# Patient Record
Sex: Male | Born: 1940
Health system: Southern US, Community
[De-identification: ages and names within clinical notes are randomized; demographics above are authoritative.]

## PROBLEM LIST (undated history)

## (undated) DIAGNOSIS — F32A Depression, unspecified: Secondary | ICD-10-CM

## (undated) DIAGNOSIS — I10 Essential (primary) hypertension: Secondary | ICD-10-CM

## (undated) DIAGNOSIS — E119 Type 2 diabetes mellitus without complications: Secondary | ICD-10-CM

## (undated) DIAGNOSIS — K420 Umbilical hernia with obstruction, without gangrene: Secondary | ICD-10-CM

## (undated) DIAGNOSIS — Z86010 Personal history of colonic polyps: Secondary | ICD-10-CM

## (undated) DIAGNOSIS — K219 Gastro-esophageal reflux disease without esophagitis: Secondary | ICD-10-CM

## (undated) DIAGNOSIS — Z860101 Personal history of adenomatous and serrated colon polyps: Secondary | ICD-10-CM

## (undated) DIAGNOSIS — C61 Malignant neoplasm of prostate: Secondary | ICD-10-CM

## (undated) DIAGNOSIS — E785 Hyperlipidemia, unspecified: Secondary | ICD-10-CM

## (undated) HISTORY — DX: Essential (primary) hypertension: I10

## (undated) HISTORY — DX: Umbilical hernia with obstruction, without gangrene: K42.0

## (undated) HISTORY — DX: Malignant neoplasm of prostate: C61

## (undated) HISTORY — DX: Personal history of adenomatous and serrated colon polyps: Z86.0101

## (undated) HISTORY — DX: Personal history of colonic polyps: Z86.010

## (undated) HISTORY — DX: Gastro-esophageal reflux disease without esophagitis: K21.9

---

## 2001-08-26 HISTORY — PX: KNEE ARTHROSCOPY: SUR90

## 2002-05-04 ENCOUNTER — Ambulatory Visit (HOSPITAL_BASED_OUTPATIENT_CLINIC_OR_DEPARTMENT_OTHER): Admission: RE | Admit: 2002-05-04 | Discharge: 2002-05-04 | Payer: Self-pay | Admitting: Orthopaedic Surgery

## 2003-07-15 ENCOUNTER — Emergency Department (HOSPITAL_COMMUNITY): Admission: EM | Admit: 2003-07-15 | Discharge: 2003-07-16 | Payer: Self-pay | Admitting: Emergency Medicine

## 2006-03-28 ENCOUNTER — Ambulatory Visit (HOSPITAL_COMMUNITY): Admission: RE | Admit: 2006-03-28 | Discharge: 2006-03-28 | Payer: Self-pay | Admitting: Family Medicine

## 2007-06-23 ENCOUNTER — Ambulatory Visit (HOSPITAL_COMMUNITY): Admission: RE | Admit: 2007-06-23 | Discharge: 2007-06-23 | Payer: Self-pay | Admitting: Internal Medicine

## 2007-06-23 ENCOUNTER — Ambulatory Visit: Payer: Self-pay | Admitting: Internal Medicine

## 2007-06-23 ENCOUNTER — Encounter: Payer: Self-pay | Admitting: Internal Medicine

## 2007-06-23 HISTORY — PX: COLONOSCOPY: SHX174

## 2007-12-16 ENCOUNTER — Ambulatory Visit (HOSPITAL_COMMUNITY): Admission: RE | Admit: 2007-12-16 | Discharge: 2007-12-16 | Payer: Self-pay | Admitting: Internal Medicine

## 2008-01-13 ENCOUNTER — Ambulatory Visit (HOSPITAL_COMMUNITY): Admission: RE | Admit: 2008-01-13 | Discharge: 2008-01-13 | Payer: Self-pay | Admitting: Internal Medicine

## 2010-11-14 ENCOUNTER — Emergency Department (HOSPITAL_COMMUNITY)
Admission: EM | Admit: 2010-11-14 | Discharge: 2010-11-14 | Disposition: A | Discharge status: Home / Self Care | Payer: PRIVATE HEALTH INSURANCE | Attending: Emergency Medicine | Admitting: Emergency Medicine

## 2010-11-14 ENCOUNTER — Emergency Department (HOSPITAL_COMMUNITY): Payer: PRIVATE HEALTH INSURANCE

## 2010-11-14 DIAGNOSIS — M542 Cervicalgia: Secondary | ICD-10-CM | POA: Insufficient documentation

## 2010-11-14 DIAGNOSIS — I10 Essential (primary) hypertension: Secondary | ICD-10-CM | POA: Insufficient documentation

## 2010-11-14 DIAGNOSIS — R51 Headache: Secondary | ICD-10-CM | POA: Insufficient documentation

## 2010-11-17 ENCOUNTER — Emergency Department (HOSPITAL_COMMUNITY)
Admission: EM | Admit: 2010-11-17 | Discharge: 2010-11-17 | Disposition: A | Discharge status: Home / Self Care | Payer: PRIVATE HEALTH INSURANCE | Attending: Emergency Medicine | Admitting: Emergency Medicine

## 2010-11-17 DIAGNOSIS — I10 Essential (primary) hypertension: Secondary | ICD-10-CM | POA: Insufficient documentation

## 2010-11-17 DIAGNOSIS — K219 Gastro-esophageal reflux disease without esophagitis: Secondary | ICD-10-CM | POA: Insufficient documentation

## 2010-11-17 DIAGNOSIS — R51 Headache: Secondary | ICD-10-CM | POA: Insufficient documentation

## 2011-01-08 NOTE — Op Note (Signed)
Alejandro Krueger, Alejandro Krueger                ACCOUNT NO.:  [id number]   MEDICAL RECORD NO.:  [id number]          PATIENT TYPE:  AMB   LOCATION:  DAY                           FACILITY:  [facility]   PHYSICIAN:  R. Roetta Sessions, M.D. DATE OF BIRTH:  08/25/1941   DATE OF PROCEDURE:  06/23/2007  DATE OF DISCHARGE:                               OPERATIVE REPORT   PROCEDURE:  Colonoscopy and biopsy and snare polypectomy.   INDICATIONS FOR PROCEDURE:  A 70 year old gentleman with no lower GI  tract symptoms and no family history of colon cancer sent over at the  courtesy of Dr. Margo Aye for colorectal cancer screening.   He has never had his colon imaged previously.  Colonoscopy is now being  done as a standard screening maneuver. This approach has been discussed  with the patient at length. The potential risks, benefits and  alternatives have been reviewed, questions answered and he is agreeable.  Please see the documentation in the medical record.   MONITORING:  O2 saturation, blood pressure, pulse, and respirations were  monitored throughout the entire procedure.   CONSCIOUS SEDATION:  Versed 4 mg IV, Demerol 75 mg IV in divided doses.   INSTRUMENT:  Pentax video chip system.   Digital rectal exam revealed no abnormalities.   ENDOSCOPIC FINDINGS:  The prep was adequate.   COLON:  The colonic mucosa was surveyed from the rectosigmoid junction  through the left transverse and right colon to the area of the  appendiceal orifice, ileocecal valve and cecum.  These structures were  well seen and photographed for the record.  From this level, the scope  was slowly withdrawn. All previously mentioned mucosal surfaces were  again seen. The patient was noted to have left-sided diverticula.  The  patient had cecal and hepatic flexure polyps.  There were two 5 mm  polyps at the base of the cecum which were cold snared and cleanly  removed and recovered through the scope.  There was a third diminutive  polyp  at the base of the cecum which was cold biopsied/removed. At the  hepatic flexure, there was a single 4-mm polyp which was cold snared and  recovered through the scope. The remainder of the colonic mucosa  appeared normal. The scope was pulled down to the rectum where a  thorough examination of the rectal mucosa including a retroflexed view  of the anal verge demonstrated no abnormalities.  The patient tolerated  the procedure well and was reacted in endoscopy.   IMPRESSION:  1. Normal rectum.  2. Left-sided diverticula.  3. Multiple polyps removed (cecum and hepatic flexure). The remainder      of the colonic mucosa appeared normal.   RECOMMENDATIONS:  1. Diverticulosis literature provided to Mr. Ruane.  2. Follow-up on path.  3. Further recommendations to follow.      Jonathon Bellows, M.D.  Electronically Signed     RMR/MEDQ  D:  06/23/2007  T:  06/23/2007  Job:  [id number]   cc:   Catalina Pizza, M.D.  Fax: [phone]

## 2011-01-11 NOTE — Op Note (Signed)
Alejandro Krueger, Alejandro Krueger                           ACCOUNT NO.:  [id number]   MEDICAL RECORD NO.:  [id number]                   PATIENT TYPE:   LOCATION:                                       FACILITY:  [facility]   PHYSICIAN:  Lubertha Basque. Jerl Santos, M.D.             DATE OF BIRTH:   DATE OF PROCEDURE:  05/04/2002  DATE OF DISCHARGE:                                 OPERATIVE REPORT   PREOPERATIVE DIAGNOSIS:  Right knee torn medial meniscus.   POSTOPERATIVE DIAGNOSES:  1. Right knee torn medial meniscus.  2. Right knee chondromalacia of patellofemoral joint.   PROCEDURES:  1. Right knee partial medial meniscectomy.  2. Right knee chondroplasty of the patellofemoral joint.   ANESTHESIA:  Knee block/MAC.   ATTENDING SURGEON:  Lubertha Basque. Jerl Santos, M.D.   ASSISTANT:  Prince Rome, P.A.-C   INDICATIONS FOR PROCEDURE:  The patient is a 70 year old man, with a long  history of right knee pain since a work-related injury where he stepped into  a hole.  He has persisted with pain and swelling since that time.  History  and exam are consistent with a torn median meniscus, and he has been offered  an arthroscopy having failed nonoperative measures of activity restriction  and anti-inflammatories and bracing.  Informed operative consent was  obtained after a discussion of the possible complications of reaction to  anesthesia and infection.   DESCRIPTION OF PROCEDURE:  The patient was taken to the operating suite  where a knee block was applied along with MAC.  He was positioned supine and  prepped and draped in the normal sterile fashion.  After administration of  IV antibiotics, an arthroscopy of the right knee was performed through two  inferior portals.  The patient remained awake throughout the case.  Suprapatellar pouch was benign, while the patellofemoral joint exhibited  some grade 2 and grade 3 change across the apex of the patella and through  the intertrochlear groove.  A thorough  chondroplasty was done of loose  articular cartilage.  The kneecap tracked fairly well.   In the medial compartment, he did have a degenerative tear of the posterior  horn of the medial meniscus.  This required removal of about 15% of the  medial meniscus back to stable structures.  He had minimal degenerative  change in his compartment.  ACL was intact, and the lateral compartment was  completely benign.  The knee was thoroughly irrigated at the end of the case  followed by placement of Marcaine with epinephrine and morphine.  Adaptic  was placed over the portals followed by dry gauze and a loose Ace wrap.  Estimated blood loss and intraoperative fluids can be obtained from  anesthesia records.    DISPOSITION:  The patient was taken to the recovery room in stable  condition.  Plans were for him to go home the same day and follow up in  the  office in approximately one week.  I will contact him by phone tonight.                                                 Lubertha Basque Jerl Santos, M.D.    PGD/MEDQ  D:  05/04/2002  T:  05/04/2002  Job:  [id number]

## 2012-05-14 ENCOUNTER — Encounter: Payer: Self-pay | Admitting: *Deleted

## 2013-12-15 ENCOUNTER — Encounter: Payer: Self-pay | Admitting: Gastroenterology

## 2013-12-15 ENCOUNTER — Ambulatory Visit (INDEPENDENT_AMBULATORY_CARE_PROVIDER_SITE_OTHER): Payer: Medicare Other | Admitting: Gastroenterology

## 2013-12-15 ENCOUNTER — Encounter (INDEPENDENT_AMBULATORY_CARE_PROVIDER_SITE_OTHER): Payer: Self-pay

## 2013-12-15 ENCOUNTER — Other Ambulatory Visit: Payer: Self-pay | Admitting: Gastroenterology

## 2013-12-15 VITALS — BP 152/83 | HR 52 | Temp 97.9°F | Ht 68.0 in | Wt 168.2 lb

## 2013-12-15 DIAGNOSIS — Z86010 Personal history of colonic polyps: Secondary | ICD-10-CM | POA: Insufficient documentation

## 2013-12-15 DIAGNOSIS — K219 Gastro-esophageal reflux disease without esophagitis: Secondary | ICD-10-CM

## 2013-12-15 DIAGNOSIS — Z860101 Personal history of adenomatous and serrated colon polyps: Secondary | ICD-10-CM | POA: Insufficient documentation

## 2013-12-15 MED ORDER — PEG 3350-KCL-NA BICARB-NACL 420 G PO SOLR: 4000.0000 mL | ORAL | Status: DC

## 2013-12-15 NOTE — Assessment & Plan Note (Signed)
Chronic GERD, greater than 5 years duration. No prior EGD. Discussedbenefits of EGD to rule out complicated GERD ie Barrett's esophagus. Patient is interested in pursuing.  I have discussed the risks, alternatives, benefits with regards to but not limited to the risk of reaction to medication, bleeding, infection, perforation and the patient is agreeable to proceed. Written consent to be obtained.

## 2013-12-15 NOTE — Progress Notes (Signed)
Primary Care Physician:  Delphina Cahill, MD  Primary Gastroenterologist:  Garfield Cornea, MD   Chief Complaint  Patient presents with  . Colonoscopy    HPI:  Alejandro Krueger is a 73 y.o. male here to schedule surveillance colonoscopy for history of tubular adenomas. Last colonoscopy October 2008. We sent him a reminder letter 2013 however patient admits that he was not ready to pursue the procedure at that time therefore he put it off. He denies any constipation, diarrhea, melena, rectal bleeding, abdominal pain, vomiting, dysphagia, unintentional weight loss. He has chronic GERD dating back more than 15 years. He has been on chronic PPI therapy. Symptoms are adequately controlled. No prior EGD.  Current Outpatient Prescriptions  Medication Sig Dispense Refill  . amLODipine (NORVASC) 5 MG tablet Take 5 mg by mouth daily.      Marland Kitchen lisinopril-hydrochlorothiazide (PRINZIDE,ZESTORETIC) 20-12.5 MG per tablet Take 1 tablet by mouth daily.      Marland Kitchen omeprazole (PRILOSEC) 20 MG capsule Take 20 mg by mouth daily.        No current facility-administered medications for this visit.    Allergies as of 12/15/2013  . (No Known Allergies)    Past Medical History  Diagnosis Date  . Umbilical hernia with obstruction   . Hx of adenomatous colonic polyps   . GERD (gastroesophageal reflux disease)     Past Surgical History  Procedure Laterality Date  . Colonoscopy  06/23/2007    [id number] polyps removed (cecum and hepatic flexure). The remainder of the colonic mucosa appeared normal/Left-sided diverticula/Normal rectum (tubular adenomas)  . Knee arthroscopy  2003    right    Family History  Problem Relation Age of Onset  . Colon cancer Neg Hx     History   Social History  . Marital Status: Married    Spouse Name: N/A    Number of Children: N/A  . Years of Education: N/A   Occupational History  . Not on file.   Social History Main Topics  . Smoking status: Never Smoker   . Smokeless  tobacco: Not on file  . Alcohol Use: No  . Drug Use: No  . Sexual Activity: Not on file   Other Topics Concern  . Not on file   Social History Narrative  . No narrative on file      ROS:  General: Negative for anorexia, weight loss, fever, chills, fatigue, weakness. Eyes: Negative for vision changes.  ENT: Negative for hoarseness, difficulty swallowing , nasal congestion. CV: Negative for chest pain, angina, palpitations, dyspnea on exertion, peripheral edema.  Respiratory: Negative for dyspnea at rest, dyspnea on exertion, cough, sputum, wheezing.  GI: See history of present illness. GU:  Negative for dysuria, hematuria, urinary incontinence, urinary frequency, nocturnal urination.  MS: Negative for joint pain, low back pain.  Derm: Negative for rash or itching.  Neuro: Negative for weakness, abnormal sensation, seizure, frequent headaches, memory loss, confusion.  Psych: Negative for anxiety, depression, suicidal ideation, hallucinations.  Endo: Negative for unusual weight change.  Heme: Negative for bruising or bleeding. Allergy: Negative for rash or hives.    Physical Examination:  BP 152/83  Pulse 52  Temp(Src) 97.9 F (36.6 C) (Oral)  Ht 5\' 8"  (1.727 m)  Wt 168 lb 3.2 oz (76.295 kg)  BMI 25.58 kg/m2   General: Well-nourished, well-developed in no acute distress.  Head: Normocephalic, atraumatic.   Eyes: Conjunctiva pink, no icterus. Mouth: Oropharyngeal mucosa moist and pink , no lesions erythema or exudate. Neck:  Supple without thyromegaly, masses, or lymphadenopathy.  Lungs: Clear to auscultation bilaterally.  Heart: Regular rate and rhythm, no murmurs rubs or gallops.  Abdomen: Bowel sounds are normal, nontender, nondistended, no hepatosplenomegaly or masses, no abdominal bruits, no rebound or guarding.  Small umbilical hernia easily reducible and nontender Rectal: deferred Extremities: No lower extremity edema. No clubbing or deformities.  Neuro: Alert and  oriented x 4 , grossly normal neurologically.  Skin: Warm and dry, no rash or jaundice.   Psych: Alert and cooperative, normal mood and affect.  Labs: No recent labs  Imaging Studies: No results found.

## 2013-12-15 NOTE — Patient Instructions (Signed)
1. Colonoscopy and upper endoscopy as scheduled. See separate instructions.  

## 2013-12-15 NOTE — Assessment & Plan Note (Signed)
73 year old gentleman who presents to schedule surveillance colonoscopy. He has a history of multiple tubular adenomas removed at time of colonoscopy in 2008. He was sent reminder letter in 2013 but at that time he was not ready to pursue followup procedure. He presents at this time without any GI complaints.  I have discussed the risks, alternatives, benefits with regards to but not limited to the risk of reaction to medication, bleeding, infection, perforation and the patient is agreeable to proceed. Written consent to be obtained.

## 2013-12-15 NOTE — Progress Notes (Signed)
cc'd to pcp 

## 2013-12-24 ENCOUNTER — Encounter (HOSPITAL_COMMUNITY): Payer: Self-pay | Admitting: Pharmacy Technician

## 2014-01-05 ENCOUNTER — Ambulatory Visit (HOSPITAL_COMMUNITY)
Admission: RE | Admit: 2014-01-05 | Discharge: 2014-01-05 | Disposition: A | Discharge status: Home / Self Care | Payer: Medicare Other | Source: Ambulatory Visit | Attending: Internal Medicine | Admitting: Internal Medicine

## 2014-01-05 ENCOUNTER — Encounter (HOSPITAL_COMMUNITY): Payer: Self-pay | Admitting: *Deleted

## 2014-01-05 ENCOUNTER — Encounter (HOSPITAL_COMMUNITY)
Admission: RE | Disposition: A | Discharge status: Home / Self Care | Payer: Self-pay | Source: Ambulatory Visit | Attending: Internal Medicine

## 2014-01-05 DIAGNOSIS — K317 Polyp of stomach and duodenum: Secondary | ICD-10-CM

## 2014-01-05 DIAGNOSIS — K219 Gastro-esophageal reflux disease without esophagitis: Secondary | ICD-10-CM | POA: Insufficient documentation

## 2014-01-05 DIAGNOSIS — Z860100 Personal history of colon polyps, unspecified: Secondary | ICD-10-CM | POA: Insufficient documentation

## 2014-01-05 DIAGNOSIS — D126 Benign neoplasm of colon, unspecified: Secondary | ICD-10-CM

## 2014-01-05 DIAGNOSIS — K229 Disease of esophagus, unspecified: Secondary | ICD-10-CM

## 2014-01-05 DIAGNOSIS — Z79899 Other long term (current) drug therapy: Secondary | ICD-10-CM | POA: Insufficient documentation

## 2014-01-05 DIAGNOSIS — K5730 Diverticulosis of large intestine without perforation or abscess without bleeding: Secondary | ICD-10-CM | POA: Insufficient documentation

## 2014-01-05 DIAGNOSIS — K449 Diaphragmatic hernia without obstruction or gangrene: Secondary | ICD-10-CM | POA: Insufficient documentation

## 2014-01-05 DIAGNOSIS — Z1211 Encounter for screening for malignant neoplasm of colon: Secondary | ICD-10-CM | POA: Insufficient documentation

## 2014-01-05 DIAGNOSIS — D131 Benign neoplasm of stomach: Secondary | ICD-10-CM | POA: Insufficient documentation

## 2014-01-05 DIAGNOSIS — K2970 Gastritis, unspecified, without bleeding: Secondary | ICD-10-CM | POA: Insufficient documentation

## 2014-01-05 DIAGNOSIS — Z800 Family history of malignant neoplasm of digestive organs: Secondary | ICD-10-CM | POA: Insufficient documentation

## 2014-01-05 DIAGNOSIS — K2990 Gastroduodenitis, unspecified, without bleeding: Secondary | ICD-10-CM

## 2014-01-05 HISTORY — PX: COLONOSCOPY: SHX5424

## 2014-01-05 HISTORY — PX: ESOPHAGOGASTRODUODENOSCOPY: SHX5428

## 2014-01-05 SURGERY — COLONOSCOPY
Anesthesia: Moderate Sedation

## 2014-01-05 MED ORDER — MIDAZOLAM HCL 5 MG/5ML IJ SOLN
INTRAMUSCULAR | Status: AC
  Filled 2014-01-05: qty 10

## 2014-01-05 MED ORDER — MEPERIDINE HCL 100 MG/ML IJ SOLN
INTRAMUSCULAR | Status: DC | PRN
  Administered 2014-01-05 (×2): 25 mg via INTRAVENOUS

## 2014-01-05 MED ORDER — LIDOCAINE VISCOUS 2 % MT SOLN
OROMUCOSAL | Status: DC | PRN
  Administered 2014-01-05 (×2): 2 mL via OROMUCOSAL

## 2014-01-05 MED ORDER — ONDANSETRON HCL 4 MG/2ML IJ SOLN
INTRAMUSCULAR | Status: AC
  Filled 2014-01-05: qty 2

## 2014-01-05 MED ORDER — LIDOCAINE VISCOUS 2 % MT SOLN
OROMUCOSAL | Status: AC
  Filled 2014-01-05: qty 15

## 2014-01-05 MED ORDER — ONDANSETRON HCL 4 MG/2ML IJ SOLN
INTRAMUSCULAR | Status: DC | PRN
  Administered 2014-01-05: 4 mg via INTRAVENOUS

## 2014-01-05 MED ORDER — SODIUM CHLORIDE 0.9 % IV BOLUS (SEPSIS)
500.0000 mL | Freq: Once | INTRAVENOUS | Status: AC
  Administered 2014-01-05: 500 mL via INTRAVENOUS

## 2014-01-05 MED ORDER — MEPERIDINE HCL 100 MG/ML IJ SOLN
INTRAMUSCULAR | Status: AC
  Filled 2014-01-05: qty 2

## 2014-01-05 MED ORDER — MIDAZOLAM HCL 5 MG/5ML IJ SOLN
INTRAMUSCULAR | Status: DC | PRN
  Administered 2014-01-05: 2 mg via INTRAVENOUS
  Administered 2014-01-05 (×3): 1 mg via INTRAVENOUS

## 2014-01-05 MED ORDER — STERILE WATER FOR IRRIGATION IR SOLN
Status: DC | PRN
  Administered 2014-01-05: 10:00:00

## 2014-01-05 MED ORDER — SODIUM CHLORIDE 0.9 % IV SOLN
INTRAVENOUS | Status: DC
  Administered 2014-01-05: 09:00:00 via INTRAVENOUS

## 2014-01-05 NOTE — Discharge Instructions (Addendum)
GERD, polyp and diverticulosis information provided  Continue omeprazole 20 mg daily  Further recommendations to follow pending review of pathology report    Colonoscopy Discharge Instructions  Read the instructions outlined below and refer to this sheet in the next few weeks. These discharge instructions provide you with general information on caring for yourself after you leave the hospital. Your doctor may also give you specific instructions. While your treatment has been planned according to the most current medical practices available, unavoidable complications occasionally occur. If you have any problems or questions after discharge, call Dr. Gala Romney at [phone]. ACTIVITY  You may resume your regular activity, but move at a slower pace for the next 24 hours.   Take frequent rest periods for the next 24 hours.   Walking will help get rid of the air and reduce the bloated feeling in your belly (abdomen).   No driving for 24 hours (because of the medicine (anesthesia) used during the test).    Do not sign any important legal documents or operate any machinery for 24 hours (because of the anesthesia used during the test).  NUTRITION  Drink plenty of fluids.   You may resume your normal diet as instructed by your doctor.   Begin with a light meal and progress to your normal diet. Heavy or fried foods are harder to digest and may make you feel sick to your stomach (nauseated).   Avoid alcoholic beverages for 24 hours or as instructed.  MEDICATIONS  You may resume your normal medications unless your doctor tells you otherwise.  WHAT YOU CAN EXPECT TODAY  Some feelings of bloating in the abdomen.   Passage of more gas than usual.   Spotting of blood in your stool or on the toilet paper.  IF YOU HAD POLYPS REMOVED DURING THE COLONOSCOPY:  No aspirin products for 7 days or as instructed.   No alcohol for 7 days or as instructed.   Eat a soft diet for the next 24 hours.    FINDING OUT THE RESULTS OF YOUR TEST Not all test results are available during your visit. If your test results are not back during the visit, make an appointment with your caregiver to find out the results. Do not assume everything is normal if you have not heard from your caregiver or the medical facility. It is important for you to follow up on all of your test results.  SEEK IMMEDIATE MEDICAL ATTENTION IF:  You have more than a spotting of blood in your stool.   Your belly is swollen (abdominal distention).   You are nauseated or vomiting.   You have a temperature over 101.  You have abdominal pain or discomfort that is severe or gets worse throughout the day. EGD Discharge instructions Please read the instructions outlined below and refer to this sheet in the next few weeks. These discharge instructions provide you with general information on caring for yourself after you leave the hospital. Your doctor may also give you specific instructions. While your treatment has been planned according to the most current medical practices available, unavoidable complications occasionally occur. If you have any problems or questions after discharge, please call your doctor. ACTIVITY You may resume your regular activity but move at a slower pace for the next 24 hours.  Take frequent rest periods for the next 24 hours.  Walking will help expel (get rid of) the air and reduce the bloated feeling in your abdomen.  No driving for 24 hours (because  of the anesthesia (medicine) used during the test).  You may shower.  Do not sign any important legal documents or operate any machinery for 24 hours (because of the anesthesia used during the test).  NUTRITION Drink plenty of fluids.  You may resume your normal diet.  Begin with a light meal and progress to your normal diet.  Avoid alcoholic beverages for 24 hours or as instructed by your caregiver.  MEDICATIONS You may resume your normal medications  unless your caregiver tells you otherwise.  WHAT YOU CAN EXPECT TODAY You may experience abdominal discomfort such as a feeling of fullness or gas pains.  FOLLOW-UP Your doctor will discuss the results of your test with you.  SEEK IMMEDIATE MEDICAL ATTENTION IF ANY OF THE FOLLOWING OCCUR: Excessive nausea (feeling sick to your stomach) and/or vomiting.  Severe abdominal pain and distention (swelling).  Trouble swallowing.  Temperature over 101 F (37.8 C).  Rectal bleeding or vomiting of blood.   Gastroesophageal Reflux Disease, Adult Gastroesophageal reflux disease (GERD) happens when acid from your stomach flows up into the esophagus. When acid comes in contact with the esophagus, the acid causes soreness (inflammation) in the esophagus. Over time, GERD may create small holes (ulcers) in the lining of the esophagus. CAUSES   Increased body weight. This puts pressure on the stomach, making acid rise from the stomach into the esophagus.  Smoking. This increases acid production in the stomach.  Drinking alcohol. This causes decreased pressure in the lower esophageal sphincter (valve or ring of muscle between the esophagus and stomach), allowing acid from the stomach into the esophagus.  Late evening meals and a full stomach. This increases pressure and acid production in the stomach.  A malformed lower esophageal sphincter. Sometimes, no cause is found. SYMPTOMS   Burning pain in the lower part of the mid-chest behind the breastbone and in the mid-stomach area. This may occur twice a week or more often.  Trouble swallowing.  Sore throat.  Dry cough.  Asthma-like symptoms including chest tightness, shortness of breath, or wheezing. DIAGNOSIS  Your caregiver may be able to diagnose GERD based on your symptoms. In some cases, X-rays and other tests may be done to check for complications or to check the condition of your stomach and esophagus. TREATMENT  Your caregiver may  recommend over-the-counter or prescription medicines to help decrease acid production. Ask your caregiver before starting or adding any new medicines.  HOME CARE INSTRUCTIONS   Change the factors that you can control. Ask your caregiver for guidance concerning weight loss, quitting smoking, and alcohol consumption.  Avoid foods and drinks that make your symptoms worse, such as:  Caffeine or alcoholic drinks.  Chocolate.  Peppermint or mint flavorings.  Garlic and onions.  Spicy foods.  Citrus fruits, such as oranges, lemons, or limes.  Tomato-based foods such as sauce, chili, salsa, and pizza.  Fried and fatty foods.  Avoid lying down for the 3 hours prior to your bedtime or prior to taking a nap.  Eat small, frequent meals instead of large meals.  Wear loose-fitting clothing. Do not wear anything tight around your waist that causes pressure on your stomach.  Raise the head of your bed 6 to 8 inches with wood blocks to help you sleep. Extra pillows will not help.  Only take over-the-counter or prescription medicines for pain, discomfort, or fever as directed by your caregiver.  Do not take aspirin, ibuprofen, or other nonsteroidal anti-inflammatory drugs (NSAIDs). SEEK IMMEDIATE MEDICAL CARE IF:  You have pain in your arms, neck, jaw, teeth, or back.  Your pain increases or changes in intensity or duration.  You develop nausea, vomiting, or sweating (diaphoresis).  You develop shortness of breath, or you faint.  Your vomit is green, yellow, black, or looks like coffee grounds or blood.  Your stool is red, bloody, or black. These symptoms could be signs of other problems, such as heart disease, gastric bleeding, or esophageal bleeding. MAKE SURE YOU:   Understand these instructions.  Will watch your condition.  Will get help right away if you are not doing well or get worse. Document Released: 05/22/2005 Document Revised: 11/04/2011 Document Reviewed:  03/01/2011 [facility] Patient Information 2014 [place], [place].   Colon Polyps Polyps are lumps of extra tissue growing inside the body. Polyps can grow in the large intestine (colon). Most colon polyps are noncancerous (benign). However, some colon polyps can become cancerous over time. Polyps that are larger than a pea may be harmful. To be safe, caregivers remove and test all polyps. CAUSES  Polyps form when mutations in the genes cause your cells to grow and divide even though no more tissue is needed. RISK FACTORS There are a number of risk factors that can increase your chances of getting colon polyps. They include:  Being older than 50 years.  Family history of colon polyps or colon cancer.  Long-term colon diseases, such as colitis or Crohn disease.  Being overweight.  Smoking.  Being inactive.  Drinking too much alcohol. SYMPTOMS  Most small polyps do not cause symptoms. If symptoms are present, they may include:  Blood in the stool. The stool may look dark red or black.  Constipation or diarrhea that lasts longer than 1 week. DIAGNOSIS People often do not know they have polyps until their caregiver finds them during a regular checkup. Your caregiver can use 4 tests to check for polyps:  Digital rectal exam. The caregiver wears gloves and feels inside the rectum. This test would find polyps only in the rectum.  Barium enema. The caregiver puts a liquid called barium into your rectum before taking X-rays of your colon. Barium makes your colon look Yamna Mackel. Polyps are dark, so they are easy to see in the X-ray pictures.  Sigmoidoscopy. A thin, flexible tube (sigmoidoscope) is placed into your rectum. The sigmoidoscope has a light and tiny camera in it. The caregiver uses the sigmoidoscope to look at the last third of your colon.  Colonoscopy. This test is like sigmoidoscopy, but the caregiver looks at the entire colon. This is the most common method for finding and removing  polyps. TREATMENT  Any polyps will be removed during a sigmoidoscopy or colonoscopy. The polyps are then tested for cancer. PREVENTION  To help lower your risk of getting more colon polyps:  Eat plenty of fruits and vegetables. Avoid eating fatty foods.  Do not smoke.  Avoid drinking alcohol.  Exercise every day.  Lose weight if recommended by your caregiver.  Eat plenty of calcium and folate. Foods that are rich in calcium include milk, cheese, and broccoli. Foods that are rich in folate include chickpeas, kidney beans, and spinach. HOME CARE INSTRUCTIONS Keep all follow-up appointments as directed by your caregiver. You may need periodic exams to check for polyps. SEEK MEDICAL CARE IF: You notice bleeding during a bowel movement. Document Released: 05/08/2004 Document Revised: 11/04/2011 Document Reviewed: 10/22/2011 [facility] Patient Information 2014 [place].    Diverticulosis Diverticulosis is a common condition that develops when small  pouches (diverticula) form in the wall of the colon. The risk of diverticulosis increases with age. It happens more often in people who eat a low-fiber diet. Most individuals with diverticulosis have no symptoms. Those individuals with symptoms usually experience abdominal pain, constipation, or loose stools (diarrhea). HOME CARE INSTRUCTIONS   Increase the amount of fiber in your diet as directed by your caregiver or dietician. This may reduce symptoms of diverticulosis.  Your caregiver may recommend taking a dietary fiber supplement.  Drink at least 6 to 8 glasses of water each day to prevent constipation.  Try not to strain when you have a bowel movement.  Your caregiver may recommend avoiding nuts and seeds to prevent complications, although this is still an uncertain benefit.  Only take over-the-counter or prescription medicines for pain, discomfort, or fever as directed by your caregiver. FOODS WITH HIGH FIBER CONTENT  INCLUDE:  Fruits. Apple, peach, pear, tangerine, raisins, prunes.  Vegetables. Brussels sprouts, asparagus, broccoli, cabbage, carrot, cauliflower, romaine lettuce, spinach, summer squash, tomato, winter squash, zucchini.  Starchy Vegetables. Baked beans, kidney beans, lima beans, split peas, lentils, potatoes (with skin).  Grains. Whole wheat bread, brown rice, bran flake cereal, plain oatmeal, Radames Mejorado rice, shredded wheat, bran muffins. SEEK IMMEDIATE MEDICAL CARE IF:   You develop increasing pain or severe bloating.  You have an oral temperature above 102 F (38.9 C), not controlled by medicine.  You develop vomiting or bowel movements that are bloody or black. Document Released: 05/09/2004 Document Revised: 11/04/2011 Document Reviewed: 01/10/2010 [facility] Patient Information 2014 [place].

## 2014-01-05 NOTE — Op Note (Signed)
[facility] [address] [place], [address]   COLONOSCOPY PROCEDURE REPORT  PATIENT: Alejandro Krueger, Alejandro Krueger  MR#:         [id number] BIRTHDATE: Mar 09, 1941 , 73  yrs. old GENDER: Male ENDOSCOPIST: R.  Garfield Cornea, MD FACP FACG REFERRED BY:  Delphina Cahill, M.D. PROCEDURE DATE:  01/05/2014 PROCEDURE:     Colonoscopy with snare polypectomy and polyp ablation  INDICATIONS: History of colonic adenoma  INFORMED CONSENT:  The risks, benefits, alternatives and imponderables including but not limited to bleeding, perforation as well as the possibility of a missed lesion have been reviewed.  The potential for biopsy, lesion removal, etc. have also been discussed.  Questions have been answered.  All parties agreeable. Please see the history and physical in the medical record for more information.  MEDICATIONS: Versed 5 mg IV and Demerol 50 mg IV in divided doses. Zofran 4 mg IV  DESCRIPTION OF PROCEDURE:  After a digital rectal exam was performed, the EG-2990i ([id number]) and EC-3890Li ([id number]) colonoscope was advanced from the anus through the rectum and colon to the area of the cecum, ileocecal valve and appendiceal orifice. The cecum was deeply intubated.  These structures were well-seen and photographed for the record.  From the level of the cecum and ileocecal valve, the scope was slowly and cautiously withdrawn. The mucosal surfaces were carefully surveyed utilizing scope tip deflection to facilitate fold flattening as needed.  The scope was pulled down into the rectum where a thorough examination including retroflexion was performed.    FINDINGS:  Adequate preparation. Normal rectum. Somewhat of a redundant colon. Left-sided diverticula; (1) 5 mm polyp in the ascending segment with an adjacent diminutive polyp.  The patient also had a 5 mm polyp at the splenic flexure; the remainder of the colonic mucosa appeared normal.  THERAPEUTIC / DIAGNOSTIC MANEUVERS PERFORMED:   The (2) 5 mm polyps were hot and cold snare removed. The diminutive polyp in the ascending segment was ablated with the tip of a hot snare loop.  COMPLICATIONS: none CECAL WITHDRAWAL TIME:  11 minutes  IMPRESSION:  Colonic diverticulosis. Multiple colonic polyps removed with snare and ablated  RECOMMENDATIONS: Followup on pathology. See EGD report.   _______________________________ eSigned:  R. Garfield Cornea, MD FACP [facility] 01/05/2014 10:54 AM   CC:

## 2014-01-05 NOTE — Op Note (Signed)
[facility] [address] [place], [address]   ENDOSCOPY PROCEDURE REPORT  PATIENT: Alejandro Krueger, Alejandro Krueger  MR#: [id number] BIRTHDATE: [birth year] , 73  yrs. old GENDER: Male ENDOSCOPIST: R.  Garfield Cornea, MD FACP FACG REFERRED BY:  Delphina Cahill, M.D. PROCEDURE DATE:  01/05/2014 PROCEDURE:     EGD with gastric and esophageal biopsy  INDICATIONS:     Long-standing GERD  INFORMED CONSENT:   The risks, benefits, limitations, alternatives and imponderables have been discussed.  The potential for biopsy, esophogeal dilation, etc. have also been reviewed.  Questions have been answered.  All parties agreeable.  Please see the history and physical in the medical record for more information.  MEDICATIONS:      Versed 3 mg IV and Demerol 25 mg IV in divided doses. Zofran 4 mg IV. Xylocaine gel orally  DESCRIPTION OF PROCEDURE:   The EG-2990i ([id number])  endoscope was introduced through the mouth and advanced to the second portion of the duodenum without difficulty or limitations.  The mucosal surfaces were surveyed very carefully during advancement of the scope and upon withdrawal.  Retroflexion view of the proximal stomach and esophagogastric junction was performed.      FINDINGS: Single 1.5 cm "tongue" of salmon-colored epithelium involving the distal esophagus; otherwise, esophagus appeared normal. Stomach empty. 4-5 cm hiatal hernia. Multiple 1-3 mm benign-appearing gastric polyps. No ulcer or infiltrating process. Patent pylorus. Abnormal first and second portion of the duodenum  THERAPEUTIC / DIAGNOSTIC MANEUVERS PERFORMED:  Biopsies one of the gastric polyps in the abnormal distal esophagus taken for histologic study   COMPLICATIONS:  None  IMPRESSION:   Abnormal distal esophagus - query short segment Barrett's - status post biopsy. Hiatal hernia. Gastric polyps - status post biopsy.  RECOMMENDATIONS:  Continue PPI. Followup on pathology. See colonoscopy  report.    _______________________________ R. Garfield Cornea, MD FACP [facility] eSigned:  R. Garfield Cornea, MD FACP [facility] 01/05/2014 10:24 AM     CC:

## 2014-01-05 NOTE — Interval H&P Note (Signed)
History and Physical Interval Note:  01/05/2014 10:01 AM  Alejandro Krueger  has presented today for surgery, with the diagnosis of COLON POLYPS AND GERD  The various methods of treatment have been discussed with the patient and family. After consideration of risks, benefits and other options for treatment, the patient has consented to  Procedure(s) with comments: COLONOSCOPY (N/A) - 9:30 ESOPHAGOGASTRODUODENOSCOPY (EGD) (N/A) as a surgical intervention .  The patient's history has been reviewed, patient examined, no change in status, stable for surgery.  I have reviewed the patient's chart and labs.  Questions were answered to the patient's satisfaction.     Alejandro Krueger  No change. EGD and colonoscopy per plan.  The risks, benefits, limitations, imponderables and alternatives regarding both EGD and colonoscopy have been reviewed with the patient. Questions have been answered. All parties agreeable.

## 2014-01-05 NOTE — H&P (View-Only) (Signed)
Primary Care Physician:  Delphina Cahill, MD  Primary Gastroenterologist:  Garfield Cornea, MD   Chief Complaint  Patient presents with  . Colonoscopy    HPI:  Alejandro Krueger is a 73 y.o. male here to schedule surveillance colonoscopy for history of tubular adenomas. Last colonoscopy October 2008. We sent him a reminder letter 2013 however patient admits that he was not ready to pursue the procedure at that time therefore he put it off. He denies any constipation, diarrhea, melena, rectal bleeding, abdominal pain, vomiting, dysphagia, unintentional weight loss. He has chronic GERD dating back more than 15 years. He has been on chronic PPI therapy. Symptoms are adequately controlled. No prior EGD.  Current Outpatient Prescriptions  Medication Sig Dispense Refill  . amLODipine (NORVASC) 5 MG tablet Take 5 mg by mouth daily.      Marland Kitchen lisinopril-hydrochlorothiazide (PRINZIDE,ZESTORETIC) 20-12.5 MG per tablet Take 1 tablet by mouth daily.      Marland Kitchen omeprazole (PRILOSEC) 20 MG capsule Take 20 mg by mouth daily.        No current facility-administered medications for this visit.    Allergies as of 12/15/2013  . (No Known Allergies)    Past Medical History  Diagnosis Date  . Umbilical hernia with obstruction   . Hx of adenomatous colonic polyps   . GERD (gastroesophageal reflux disease)     Past Surgical History  Procedure Laterality Date  . Colonoscopy  06/23/2007    BTD:VVOHYWVP polyps removed (cecum and hepatic flexure). The remainder of the colonic mucosa appeared normal/Left-sided diverticula/Normal rectum (tubular adenomas)  . Knee arthroscopy  2003    right    Family History  Problem Relation Age of Onset  . Colon cancer Neg Hx     History   Social History  . Marital Status: Married    Spouse Name: N/A    Number of Children: N/A  . Years of Education: N/A   Occupational History  . Not on file.   Social History Main Topics  . Smoking status: Never Smoker   . Smokeless  tobacco: Not on file  . Alcohol Use: No  . Drug Use: No  . Sexual Activity: Not on file   Other Topics Concern  . Not on file   Social History Narrative  . No narrative on file      ROS:  General: Negative for anorexia, weight loss, fever, chills, fatigue, weakness. Eyes: Negative for vision changes.  ENT: Negative for hoarseness, difficulty swallowing , nasal congestion. CV: Negative for chest pain, angina, palpitations, dyspnea on exertion, peripheral edema.  Respiratory: Negative for dyspnea at rest, dyspnea on exertion, cough, sputum, wheezing.  GI: See history of present illness. GU:  Negative for dysuria, hematuria, urinary incontinence, urinary frequency, nocturnal urination.  MS: Negative for joint pain, low back pain.  Derm: Negative for rash or itching.  Neuro: Negative for weakness, abnormal sensation, seizure, frequent headaches, memory loss, confusion.  Psych: Negative for anxiety, depression, suicidal ideation, hallucinations.  Endo: Negative for unusual weight change.  Heme: Negative for bruising or bleeding. Allergy: Negative for rash or hives.    Physical Examination:  BP 152/83  Pulse 52  Temp(Src) 97.9 F (36.6 C) (Oral)  Ht 5\' 8"  (1.727 m)  Wt 168 lb 3.2 oz (76.295 kg)  BMI 25.58 kg/m2   General: Well-nourished, well-developed in no acute distress.  Head: Normocephalic, atraumatic.   Eyes: Conjunctiva pink, no icterus. Mouth: Oropharyngeal mucosa moist and pink , no lesions erythema or exudate. Neck:  Supple without thyromegaly, masses, or lymphadenopathy.  Lungs: Clear to auscultation bilaterally.  Heart: Regular rate and rhythm, no murmurs rubs or gallops.  Abdomen: Bowel sounds are normal, nontender, nondistended, no hepatosplenomegaly or masses, no abdominal bruits, no rebound or guarding.  Small umbilical hernia easily reducible and nontender Rectal: deferred Extremities: No lower extremity edema. No clubbing or deformities.  Neuro: Alert and  oriented x 4 , grossly normal neurologically.  Skin: Warm and dry, no rash or jaundice.   Psych: Alert and cooperative, normal mood and affect.  Labs: No recent labs  Imaging Studies: No results found.

## 2014-01-07 ENCOUNTER — Encounter (HOSPITAL_COMMUNITY): Payer: Self-pay | Admitting: Internal Medicine

## 2014-01-07 ENCOUNTER — Encounter: Payer: Self-pay | Admitting: Internal Medicine

## 2014-08-26 HISTORY — PX: OTHER SURGICAL HISTORY: SHX169

## 2014-11-09 DIAGNOSIS — M25519 Pain in unspecified shoulder: Secondary | ICD-10-CM | POA: Diagnosis not present

## 2014-11-09 DIAGNOSIS — F419 Anxiety disorder, unspecified: Secondary | ICD-10-CM | POA: Diagnosis not present

## 2014-11-09 DIAGNOSIS — Z6824 Body mass index (BMI) 24.0-24.9, adult: Secondary | ICD-10-CM | POA: Diagnosis not present

## 2014-11-09 DIAGNOSIS — F333 Major depressive disorder, recurrent, severe with psychotic symptoms: Secondary | ICD-10-CM | POA: Diagnosis not present

## 2015-01-02 DIAGNOSIS — N529 Male erectile dysfunction, unspecified: Secondary | ICD-10-CM | POA: Diagnosis not present

## 2015-01-02 DIAGNOSIS — N401 Enlarged prostate with lower urinary tract symptoms: Secondary | ICD-10-CM | POA: Diagnosis not present

## 2015-01-02 DIAGNOSIS — R351 Nocturia: Secondary | ICD-10-CM | POA: Diagnosis not present

## 2015-02-13 DIAGNOSIS — I10 Essential (primary) hypertension: Secondary | ICD-10-CM | POA: Diagnosis not present

## 2015-02-13 DIAGNOSIS — R7301 Impaired fasting glucose: Secondary | ICD-10-CM | POA: Diagnosis not present

## 2015-02-13 DIAGNOSIS — E782 Mixed hyperlipidemia: Secondary | ICD-10-CM | POA: Diagnosis not present

## 2015-02-15 DIAGNOSIS — K219 Gastro-esophageal reflux disease without esophagitis: Secondary | ICD-10-CM | POA: Diagnosis not present

## 2015-02-15 DIAGNOSIS — E782 Mixed hyperlipidemia: Secondary | ICD-10-CM | POA: Diagnosis not present

## 2015-02-15 DIAGNOSIS — I10 Essential (primary) hypertension: Secondary | ICD-10-CM | POA: Diagnosis not present

## 2015-03-01 DIAGNOSIS — C61 Malignant neoplasm of prostate: Secondary | ICD-10-CM | POA: Diagnosis not present

## 2015-03-01 DIAGNOSIS — R972 Elevated prostate specific antigen [PSA]: Secondary | ICD-10-CM | POA: Diagnosis not present

## 2015-03-08 DIAGNOSIS — C61 Malignant neoplasm of prostate: Secondary | ICD-10-CM | POA: Diagnosis not present

## 2015-03-22 DIAGNOSIS — C61 Malignant neoplasm of prostate: Secondary | ICD-10-CM | POA: Diagnosis not present

## 2015-03-31 ENCOUNTER — Encounter: Payer: Self-pay | Admitting: Radiation Oncology

## 2015-03-31 NOTE — Progress Notes (Unsigned)
GU Location of Tumor / Histology: prostatic adenocarcinoma  If Prostate Cancer, Gleason Score is (3 + 4) and PSA is (5.68)  Alejandro Krueger was being followed for BPH and ED when his PSA rose from 4.13 in October 2015 to 5.68.  Biopsies of prostate (if applicable) revealed:    Past/Anticipated interventions by urology, if any: biopsy, reviewed treatment options and referred to radiation oncology  Past/Anticipated interventions by medical oncology, if any: no  Weight changes, if any: no  Bowel/Bladder complaints, if any: nocturia x 1-2, incomplete emptying of bladder and ED. Denies weight loss, diarrhea, hematuria, dysuria, incontinence or leakage.  Nausea/Vomiting, if any: no  Pain issues, if any:  no  SAFETY ISSUES:  Prior radiation? no  Pacemaker/ICD? no  Possible current pregnancy? no  Is the patient on methotrexate? no  Current Complaints / other details:  74 year old male. Retired. Married with two sons. Patient interest in seeds.

## 2015-04-05 ENCOUNTER — Ambulatory Visit
Admission: RE | Admit: 2015-04-05 | Discharge: 2015-04-05 | Disposition: A | Discharge status: Home / Self Care | Payer: Medicare Other | Source: Ambulatory Visit | Attending: Radiation Oncology | Admitting: Radiation Oncology

## 2015-04-05 ENCOUNTER — Encounter: Payer: Self-pay | Admitting: Radiation Oncology

## 2015-04-05 VITALS — BP 126/81 | HR 74 | Resp 16 | Ht 69.0 in | Wt 174.3 lb

## 2015-04-05 DIAGNOSIS — Z833 Family history of diabetes mellitus: Secondary | ICD-10-CM | POA: Insufficient documentation

## 2015-04-05 DIAGNOSIS — Z510 Encounter for antineoplastic radiation therapy: Secondary | ICD-10-CM | POA: Diagnosis not present

## 2015-04-05 DIAGNOSIS — Z800 Family history of malignant neoplasm of digestive organs: Secondary | ICD-10-CM | POA: Insufficient documentation

## 2015-04-05 DIAGNOSIS — D126 Benign neoplasm of colon, unspecified: Secondary | ICD-10-CM | POA: Insufficient documentation

## 2015-04-05 DIAGNOSIS — C61 Malignant neoplasm of prostate: Secondary | ICD-10-CM | POA: Diagnosis not present

## 2015-04-05 DIAGNOSIS — Z8249 Family history of ischemic heart disease and other diseases of the circulatory system: Secondary | ICD-10-CM | POA: Insufficient documentation

## 2015-04-05 DIAGNOSIS — K219 Gastro-esophageal reflux disease without esophagitis: Secondary | ICD-10-CM | POA: Insufficient documentation

## 2015-04-05 DIAGNOSIS — I10 Essential (primary) hypertension: Secondary | ICD-10-CM | POA: Diagnosis not present

## 2015-04-05 NOTE — Progress Notes (Signed)
Radiation Oncology         [phone]) [phone] ________________________________  Initial outpatient Consultation  Name: Alejandro Krueger MRN: [id number]  Date: 04/05/2015  DOB: [birth year]  UX:LKGM, ZACH, MD  [name], MD   REFERRING PHYSICIAN: [name], MD  DIAGNOSIS: 74 y.o. gentleman with stage T1c adenocarcinoma of the prostate with a Gleason's score of 3+4 and a PSA of 5.68    ICD-9-CM ICD-10-CM   1. Malignant neoplasm of prostate 185 C61   2. Stage T1c Adenocarcinoma of the Prostate with Gleason's 3+4=7 and PSA=5.68 185 C61     HISTORY OF PRESENT ILLNESS::Alejandro Krueger is a 74 y.o. gentleman who has followed with Dr. Janice Norrie for erectile dysfunction.  He was noted to have an elevated PSA of 4.15 by Dr. Janice Norrie in the Fall of 2015.  He was evaluated again in urology by Dr. Janice Norrie in May 2016,  digital rectal examination was performed at that time revealing no nodules.  Repeat PSA was higher at 5.68.  The patient proceeded to transrectal ultrasound with 12 biopsies of the prostate.  The prostate volume measured 27 cc.  Out of 12 core biopsies,5 were positive.  The maximum Gleason score was 3+4, and this was seen in only one core.  The patient reviewed the biopsy results with his urologist and he has kindly been referred today for discussion of potential radiation treatment options.  PREVIOUS RADIATION THERAPY: No  PAST MEDICAL HISTORY:  has a past medical history of Umbilical hernia with obstruction; adenomatous colonic polyps; GERD (gastroesophageal reflux disease); Prostate cancer; and Hypertension.    PAST SURGICAL HISTORY: Past Surgical History  Procedure Laterality Date  . Colonoscopy  06/23/2007    WNU:UVOZDGUY polyps removed (cecum and hepatic flexure). The remainder of the colonic mucosa appeared normal/Left-sided diverticula/Normal rectum (tubular adenomas)  . Knee arthroscopy  2003    right  . Colonoscopy N/A 01/05/2014    Procedure: COLONOSCOPY;  Surgeon: Daneil Dolin, MD;   Location: [facility];  Service: Endoscopy;  Laterality: N/A;  9:30  . Esophagogastroduodenoscopy N/A 01/05/2014    Procedure: ESOPHAGOGASTRODUODENOSCOPY (EGD);  Surgeon: Daneil Dolin, MD;  Location: [facility];  Service: Endoscopy;  Laterality: N/A;  . Prostate biopsy      FAMILY HISTORY: family history includes Diabetes in his mother; Heart attack in his father. There is no history of Colon cancer.  SOCIAL HISTORY:  reports that he has never smoked. He has never used smokeless tobacco. He reports that he does not drink alcohol or use illicit drugs.  ALLERGIES: Review of patient's allergies indicates no known allergies.  MEDICATIONS:  Current Outpatient Prescriptions  Medication Sig Dispense Refill  . amLODipine (NORVASC) 5 MG tablet Take 5 mg by mouth daily.    Marland Kitchen buPROPion (WELLBUTRIN XL) 150 MG 24 hr tablet     . lisinopril-hydrochlorothiazide (PRINZIDE,ZESTORETIC) 20-12.5 MG per tablet Take 1 tablet by mouth daily.    Marland Kitchen omeprazole (PRILOSEC) 20 MG capsule Take 20 mg by mouth daily as needed (acid reflux).     . sildenafil (REVATIO) 20 MG tablet Take 20 mg by mouth 3 (three) times daily.     No current facility-administered medications for this encounter.    REVIEW OF SYSTEMS:  A 15 point review of systems is documented in the electronic medical record. This was obtained by the nursing staff. However, I reviewed this with the patient to discuss relevant findings and make appropriate changes.  A comprehensive review of systems was negative.Marland Kitchen  The patient completed an IPSS and IIEF questionnaire.  His IPSS score was 4 indicating mild urinary outflow obstructive symptoms.    PHYSICAL EXAM: This patient is in no acute distress.  He is alert and oriented.   height is 5\' 9"  (1.753 m) and weight is 174 lb 4.8 oz (79.062 kg). His blood pressure is 126/81 and his pulse is 74. His respiration is 16 and oxygen saturation is 100%.  He exhibits no respiratory distress or labored breathing.  He  appears neurologically intact.  His mood is pleasant.  His affect is appropriate.  Please note the digital rectal exam findings described above.  KPS = 100  100 - Normal; no complaints; no evidence of disease. 90   - Able to carry on normal activity; minor signs or symptoms of disease. 80   - Normal activity with effort; some signs or symptoms of disease. 61   - Cares for self; unable to carry on normal activity or to do active work. 60   - Requires occasional assistance, but is able to care for most of his personal needs. 50   - Requires considerable assistance and frequent medical care. 52   - Disabled; requires special care and assistance. 80   - Severely disabled; hospital admission is indicated although death not imminent. 50   - Very sick; hospital admission necessary; active supportive treatment necessary. 10   - Moribund; fatal processes progressing rapidly. 0     - Dead  Karnofsky DA, Abelmann WH, Craver LS and Burchenal JH [phone]) The use of the nitrogen mustards in the palliative treatment of carcinoma: with particular reference to bronchogenic carcinoma Cancer 1 634-56   LABORATORY DATA:  No results found for: WBC, HGB, HCT, MCV, PLT No results found for: NA, K, CL, CO2 No results found for: ALT, AST, GGT, ALKPHOS, BILITOT   RADIOGRAPHY: No results found.    IMPRESSION: This gentleman is a 74 y.o. gentleman with stage T1c adenocarcinoma of the prostate with a Gleason's score of 3+4 and a PSA of 5.68.  His T-Stage, Gleason's Score, and PSA put him into the intermediate risk group.  Accordingly he is eligible for a variety of potential treatment options including prostatectomy, external radiotherapy and also seed implant.  PLAN:Today I reviewed the findings and workup thus far.  We discussed the natural history of prostate cancer.  We reviewed the the implications of T-stage, Gleason's Score, and PSA on decision-making and outcomes in prostate cancer.  We discussed radiation  treatment in the management of prostate cancer with regard to the logistics and delivery of external beam radiation treatment as well as the logistics and delivery of prostate brachytherapy.  We compared and contrasted each of these approaches and also compared these against prostatectomy.  The patient expressed interest in prostate brachytherapy.  I filled out a patient counseling form for him with relevant treatment diagrams and we retained a copy for our records.   The patient would like to proceed with prostate brachytherapy.  I will share my findings with Dr. Janice Norrie and move forward with scheduling the procedure in the near future.     I enjoyed meeting with him today, and will look forward to participating in the care of this very nice gentleman.   I spent 60 minutes face to face with the patient and more than 50% of that time was spent in counseling and/or coordination of care.   ------------------------------------------------  Sheral Apley. Tammi Klippel, M.D.

## 2015-04-05 NOTE — Progress Notes (Signed)
See progress note under physician encounter. 

## 2015-04-07 ENCOUNTER — Telehealth: Payer: Self-pay | Admitting: *Deleted

## 2015-04-07 NOTE — Telephone Encounter (Signed)
CALLED PATIENT TO INFORM OF PRE-SEED PLANNING CT, LVM FOR A RETURN CALL. 

## 2015-04-10 ENCOUNTER — Telehealth: Payer: Self-pay | Admitting: *Deleted

## 2015-04-10 NOTE — Telephone Encounter (Signed)
CALLED PATIENT TO INFORM OF PRE-SEED APPT. FOR 04-28-15 @ 1 PM, SPOKE WITH PATIENT AND HE IS AWARE OF THIS APPT.

## 2015-04-13 ENCOUNTER — Telehealth: Payer: Self-pay | Admitting: *Deleted

## 2015-04-13 ENCOUNTER — Other Ambulatory Visit: Payer: Self-pay | Admitting: Urology

## 2015-04-13 NOTE — Telephone Encounter (Signed)
CALLED PATIENT TO INFORM OF IMPLANT DATE, SPOKE WITH PATIENT AND HE IS AWARE OF THIS PROCEDURE  °

## 2015-04-27 ENCOUNTER — Telehealth: Payer: Self-pay | Admitting: *Deleted

## 2015-04-27 NOTE — Telephone Encounter (Signed)
Called patient to remind of appts. For 05-04-15, spoke with patient and he is aware these appts.

## 2015-04-27 NOTE — Telephone Encounter (Signed)
Reminded patient of appts. For 04-28-15 not 05-04-15, patient aware of these appts.

## 2015-04-28 ENCOUNTER — Ambulatory Visit (HOSPITAL_COMMUNITY)
Admission: RE | Admit: 2015-04-28 | Discharge: 2015-04-28 | Disposition: A | Discharge status: Home / Self Care | Payer: Medicare Other | Source: Ambulatory Visit | Attending: Urology | Admitting: Urology

## 2015-04-28 ENCOUNTER — Ambulatory Visit
Admission: RE | Admit: 2015-04-28 | Discharge: 2015-04-28 | Disposition: A | Discharge status: Home / Self Care | Payer: Medicare Other | Source: Ambulatory Visit | Attending: Radiation Oncology | Admitting: Radiation Oncology

## 2015-04-28 DIAGNOSIS — Z01818 Encounter for other preprocedural examination: Secondary | ICD-10-CM | POA: Diagnosis not present

## 2015-04-28 DIAGNOSIS — K449 Diaphragmatic hernia without obstruction or gangrene: Secondary | ICD-10-CM | POA: Diagnosis not present

## 2015-04-28 DIAGNOSIS — Z800 Family history of malignant neoplasm of digestive organs: Secondary | ICD-10-CM | POA: Diagnosis not present

## 2015-04-28 DIAGNOSIS — Z79899 Other long term (current) drug therapy: Secondary | ICD-10-CM | POA: Diagnosis not present

## 2015-04-28 DIAGNOSIS — Z833 Family history of diabetes mellitus: Secondary | ICD-10-CM | POA: Diagnosis not present

## 2015-04-28 DIAGNOSIS — Z510 Encounter for antineoplastic radiation therapy: Secondary | ICD-10-CM | POA: Diagnosis not present

## 2015-04-28 DIAGNOSIS — K219 Gastro-esophageal reflux disease without esophagitis: Secondary | ICD-10-CM | POA: Diagnosis not present

## 2015-04-28 DIAGNOSIS — C61 Malignant neoplasm of prostate: Secondary | ICD-10-CM | POA: Insufficient documentation

## 2015-04-28 DIAGNOSIS — D126 Benign neoplasm of colon, unspecified: Secondary | ICD-10-CM | POA: Diagnosis not present

## 2015-04-28 DIAGNOSIS — Z8249 Family history of ischemic heart disease and other diseases of the circulatory system: Secondary | ICD-10-CM | POA: Diagnosis not present

## 2015-04-28 DIAGNOSIS — I10 Essential (primary) hypertension: Secondary | ICD-10-CM | POA: Diagnosis not present

## 2015-04-28 DIAGNOSIS — N529 Male erectile dysfunction, unspecified: Secondary | ICD-10-CM | POA: Diagnosis not present

## 2015-04-28 NOTE — Progress Notes (Signed)
  Radiation Oncology         ([phone]) [phone] ________________________________  Name: Alejandro Krueger MRN: [id number]  Date: 04/28/2015  DOB: [birth year]  SIMULATION AND TREATMENT PLANNING NOTE PUBIC ARCH STUDY  MV:[facility], ZACH, MD  [name], MD  DIAGNOSIS: 74 y.o. gentleman with stage T1c adenocarcinoma of the prostate with a Gleason's score of 3+4 and a PSA of 5.68    ICD-9-CM ICD-10-CM   1. Stage T1c Adenocarcinoma of the Prostate with Gleason's 3+4=7 and PSA=5.68 185 C61     COMPLEX SIMULATION:  The patient presented today for evaluation for possible prostate seed implant. He was brought to the radiation planning suite and placed supine on the CT couch. A 3-dimensional image study set was obtained in upload to the planning computer. There, on each axial slice, I contoured the prostate gland. Then, using three-dimensional radiation planning tools I reconstructed the prostate in view of the structures from the transperineal needle pathway to assess for possible pubic arch interference. In doing so, I did not appreciate any pubic arch interference. Also, the patient's prostate volume was estimated based on the drawn structure. The volume was 35 cc.  Given the pubic arch appearance and prostate volume, patient remains a good candidate to proceed with prostate seed implant. Today, he freely provided informed written consent to proceed.    PLAN: The patient will undergo prostate seed implant.  This document serves as a record of services personally performed by Tyler Pita, MD. It was created on his behalf by Arlyce Harman, a trained medical scribe. The creation of this record is based on the scribe's personal observations and the provider's statements to them. This document has been checked and approved by the attending provider.    ________________________________  Sheral Apley. Tammi Klippel, M.D.

## 2015-05-22 DIAGNOSIS — C61 Malignant neoplasm of prostate: Secondary | ICD-10-CM | POA: Diagnosis not present

## 2015-05-25 ENCOUNTER — Encounter (HOSPITAL_BASED_OUTPATIENT_CLINIC_OR_DEPARTMENT_OTHER): Payer: Self-pay | Admitting: *Deleted

## 2015-05-25 ENCOUNTER — Telehealth: Payer: Self-pay | Admitting: *Deleted

## 2015-05-25 NOTE — Progress Notes (Signed)
To [facility] in am for preop lab work.Arrive 0900 on 06/02/2015 to [facility] - Ekg,Cxr with chart.Instructed Npo after Mn-to complete fleet enema prior to arrival.

## 2015-05-25 NOTE — Telephone Encounter (Signed)
CALLED PATIENT TO REMIND OF LABS FOR IMPLANT, LVM FOR A RETURN CALL 

## 2015-05-26 DIAGNOSIS — K219 Gastro-esophageal reflux disease without esophagitis: Secondary | ICD-10-CM | POA: Diagnosis not present

## 2015-05-26 DIAGNOSIS — C61 Malignant neoplasm of prostate: Secondary | ICD-10-CM | POA: Diagnosis not present

## 2015-05-26 DIAGNOSIS — I10 Essential (primary) hypertension: Secondary | ICD-10-CM | POA: Diagnosis not present

## 2015-05-26 DIAGNOSIS — N529 Male erectile dysfunction, unspecified: Secondary | ICD-10-CM | POA: Diagnosis not present

## 2015-05-26 DIAGNOSIS — Z79899 Other long term (current) drug therapy: Secondary | ICD-10-CM | POA: Diagnosis not present

## 2015-05-26 LAB — CBC
HEMATOCRIT: 43 % (ref 39.0–52.0)
Hemoglobin: 14.2 g/dL (ref 13.0–17.0)
MCH: 28.8 pg (ref 26.0–34.0)
MCHC: 33 g/dL (ref 30.0–36.0)
MCV: 87.2 fL (ref 78.0–100.0)
Platelets: 305 10*3/uL (ref 150–400)
RBC: 4.93 MIL/uL (ref 4.22–5.81)
RDW: 14.2 % (ref 11.5–15.5)
WBC: 12.5 10*3/uL — ABNORMAL HIGH (ref 4.0–10.5)

## 2015-05-26 LAB — COMPREHENSIVE METABOLIC PANEL
ALK PHOS: 77 U/L (ref 38–126)
ALT: 21 U/L (ref 17–63)
ANION GAP: 5 (ref 5–15)
AST: 27 U/L (ref 15–41)
Albumin: 3.9 g/dL (ref 3.5–5.0)
BILIRUBIN TOTAL: 0.8 mg/dL (ref 0.3–1.2)
BUN: 18 mg/dL (ref 6–20)
CALCIUM: 8.6 mg/dL — AB (ref 8.9–10.3)
CO2: 28 mmol/L (ref 22–32)
Chloride: 106 mmol/L (ref 101–111)
Creatinine, Ser: 1.48 mg/dL — ABNORMAL HIGH (ref 0.61–1.24)
GFR calc non Af Amer: 45 mL/min — ABNORMAL LOW (ref >60)
GFR, EST AFRICAN AMERICAN: 52 mL/min — AB (ref >60)
Glucose, Bld: 132 mg/dL — ABNORMAL HIGH (ref 65–99)
Potassium: 3.4 mmol/L — ABNORMAL LOW (ref 3.5–5.1)
Sodium: 139 mmol/L (ref 135–145)
TOTAL PROTEIN: 7.5 g/dL (ref 6.5–8.1)

## 2015-05-26 LAB — PROTIME-INR
INR: 0.96 (ref 0.00–1.49)
Prothrombin Time: 13 seconds (ref 11.6–15.2)

## 2015-05-26 LAB — APTT: APTT: 31 s (ref 24–37)

## 2015-06-01 ENCOUNTER — Telehealth: Payer: Self-pay | Admitting: *Deleted

## 2015-06-01 DIAGNOSIS — D126 Benign neoplasm of colon, unspecified: Secondary | ICD-10-CM | POA: Diagnosis not present

## 2015-06-01 DIAGNOSIS — Z8249 Family history of ischemic heart disease and other diseases of the circulatory system: Secondary | ICD-10-CM | POA: Diagnosis not present

## 2015-06-01 DIAGNOSIS — Z833 Family history of diabetes mellitus: Secondary | ICD-10-CM | POA: Diagnosis not present

## 2015-06-01 DIAGNOSIS — C61 Malignant neoplasm of prostate: Secondary | ICD-10-CM | POA: Diagnosis not present

## 2015-06-01 DIAGNOSIS — Z800 Family history of malignant neoplasm of digestive organs: Secondary | ICD-10-CM | POA: Diagnosis not present

## 2015-06-01 DIAGNOSIS — K219 Gastro-esophageal reflux disease without esophagitis: Secondary | ICD-10-CM | POA: Diagnosis not present

## 2015-06-01 DIAGNOSIS — Z510 Encounter for antineoplastic radiation therapy: Secondary | ICD-10-CM | POA: Diagnosis not present

## 2015-06-01 DIAGNOSIS — I10 Essential (primary) hypertension: Secondary | ICD-10-CM | POA: Diagnosis not present

## 2015-06-01 NOTE — Telephone Encounter (Signed)
Called patient to remind of implant for 06-02-15, lvm for a return call

## 2015-06-01 NOTE — H&P (Signed)
History of Present Illness  Patient seen today as a preoperative consult for brachytherapy. He was diagnosed with low to intermediate risk prostate cancer with a PSA of 5.68, prostate 27 g, Gleason 6 in 5 to 40% of 4 cores and 3+4 in 20% of one core at the right apex and atypia at the left apex. His CAPRA score is 3. MSK nomograms he has 39% probability of organ confined disease, 59% risk of extraprostatic extension, 4% risk of lymph node involvement and 5% risk of seminal vesicle invasion. He's been extensively counseled by Dr. Janice Norrie and Dr. Tammi Klippel.    His IPss score ranges from 3-5. Occasional frequency. He has erectile dysfunction and PD 5 inhibitors were not very effective. Therefore patient has gone with the vacuum erection device with good result. He's had no dysuria or gross hematuria.   Past Medical History Problems  1. History of esophageal reflux (Z87.19) 2. History of hypertension (Z86.79)  Surgical History Problems  1. History of Knee Surgery Left  Current Meds 1. AmLODIPine Besylate 5 MG Oral Tablet;  Therapy: (Recorded:05Oct2015) to Recorded 2. Lisinopril-Hydrochlorothiazide TABS;  Therapy: (Recorded:05Oct2015) to Recorded 3. Omeprazole TBEC;  Therapy: (Recorded:05Oct2015) to Recorded  Allergies Medication  1. No Known Drug Allergies  Family History Problems  1. Family history of acute myocardial infarction (Z82.49) : Father 2. Family history of diabetes mellitus (Z83.3) : Mother  Social History Problems  1. Denied: History of Alcohol use 2. Caffeine use (F15.90) 3. Father deceased 72. Married 5. Mother deceased 41. Never a smoker 7. Number of children   2 sons 72. Retired  Review of Systems Genitourinary, constitutional, skin, eye, otolaryngeal, hematologic/lymphatic, cardiovascular, pulmonary, endocrine, musculoskeletal, gastrointestinal, neurological and psychiatric system(s) were reviewed and pertinent findings if present are noted and are otherwise  negative.  Integumentary: skin rash/lesion.  Hematologic/Lymphatic: a tendency to easily bruise.    Vitals Vital Signs [Data Includes: Last 1 Day]  Recorded: 26Sep2016 09:40AM  Blood Pressure: 141 / 74 Temperature: 97.6 F Heart Rate: 68  Physical Exam Constitutional: Well nourished and well developed . No acute distress.  Neuro/Psych:. Mood and affect are appropriate.    Results/Data Urine [Data Includes: Last 1 Day]   26Sep2016  COLOR YELLOW   APPEARANCE CLEAR   SPECIFIC GRAVITY 1.020   pH 6.0   GLUCOSE 2+   BILIRUBIN NEGATIVE   KETONE NEGATIVE   BLOOD NEGATIVE   PROTEIN NEGATIVE   NITRITE NEGATIVE   LEUKOCYTE ESTERASE NEGATIVE    Old records or history reviewed:Marland Kitchen    Assessment Assessed  1. Adenocarcinoma of prostate (C61)1    1 Amended By: Festus Aloe; May 24 2015 11:49 AM EST  Plan Health Maintenance  1. UA With REFLEX; [Do Not Release]; Status:Complete;   Done: [id number] 09:02AM  Discussion/Summary I had a long discussion with the patient about his stage, grade, prognosis. We discussed the nature risks and benefits of brachytherapy including risk of frequency, stricture, hematuria, worsening ED, and secondary cancers among others. We discussed alternatives to surgery, external beam, cryo- or HIFU. I do believe brachytherapy is a good option.     Signatures Electronically signed by : Festus Aloe, M.D.; May 24 2015 11:49AM EST

## 2015-06-02 ENCOUNTER — Ambulatory Visit (HOSPITAL_BASED_OUTPATIENT_CLINIC_OR_DEPARTMENT_OTHER): Payer: Medicare Other | Admitting: Anesthesiology

## 2015-06-02 ENCOUNTER — Encounter (HOSPITAL_BASED_OUTPATIENT_CLINIC_OR_DEPARTMENT_OTHER)
Admission: RE | Disposition: A | Discharge status: Home / Self Care | Payer: Self-pay | Source: Ambulatory Visit | Attending: Urology

## 2015-06-02 ENCOUNTER — Ambulatory Visit (HOSPITAL_COMMUNITY): Payer: Medicare Other

## 2015-06-02 ENCOUNTER — Ambulatory Visit (HOSPITAL_BASED_OUTPATIENT_CLINIC_OR_DEPARTMENT_OTHER)
Admission: RE | Admit: 2015-06-02 | Discharge: 2015-06-02 | Disposition: A | Discharge status: Home / Self Care | Payer: Medicare Other | Source: Ambulatory Visit | Attending: Urology | Admitting: Urology

## 2015-06-02 ENCOUNTER — Encounter (HOSPITAL_BASED_OUTPATIENT_CLINIC_OR_DEPARTMENT_OTHER): Payer: Self-pay | Admitting: *Deleted

## 2015-06-02 DIAGNOSIS — I10 Essential (primary) hypertension: Secondary | ICD-10-CM | POA: Insufficient documentation

## 2015-06-02 DIAGNOSIS — Z79899 Other long term (current) drug therapy: Secondary | ICD-10-CM | POA: Insufficient documentation

## 2015-06-02 DIAGNOSIS — N529 Male erectile dysfunction, unspecified: Secondary | ICD-10-CM | POA: Insufficient documentation

## 2015-06-02 DIAGNOSIS — K219 Gastro-esophageal reflux disease without esophagitis: Secondary | ICD-10-CM | POA: Insufficient documentation

## 2015-06-02 DIAGNOSIS — C61 Malignant neoplasm of prostate: Secondary | ICD-10-CM | POA: Diagnosis not present

## 2015-06-02 DIAGNOSIS — Z01818 Encounter for other preprocedural examination: Secondary | ICD-10-CM

## 2015-06-02 HISTORY — PX: RADIOACTIVE SEED IMPLANT: SHX5150

## 2015-06-02 SURGERY — INSERTION, RADIATION SOURCE, PROSTATE
Anesthesia: General | Site: Prostate

## 2015-06-02 MED ORDER — DEXAMETHASONE SODIUM PHOSPHATE 4 MG/ML IJ SOLN
INTRAMUSCULAR | Status: DC | PRN
  Administered 2015-06-02: 10 mg via INTRAVENOUS

## 2015-06-02 MED ORDER — OXYCODONE-ACETAMINOPHEN 5-325 MG PO TABS: 1.0000 | ORAL_TABLET | Freq: Four times a day (QID) | ORAL | Status: DC | PRN

## 2015-06-02 MED ORDER — FENTANYL CITRATE (PF) 100 MCG/2ML IJ SOLN
INTRAMUSCULAR | Status: AC
  Filled 2015-06-02: qty 4

## 2015-06-02 MED ORDER — ACETAMINOPHEN 10 MG/ML IV SOLN
INTRAVENOUS | Status: DC | PRN
  Administered 2015-06-02: 1000 mg via INTRAVENOUS

## 2015-06-02 MED ORDER — LIDOCAINE HCL 4 % MT SOLN
OROMUCOSAL | Status: DC | PRN
  Administered 2015-06-02: 3 mL via TOPICAL

## 2015-06-02 MED ORDER — FENTANYL CITRATE (PF) 100 MCG/2ML IJ SOLN
25.0000 ug | INTRAMUSCULAR | Status: DC | PRN
  Filled 2015-06-02: qty 1

## 2015-06-02 MED ORDER — PROPOFOL 10 MG/ML IV BOLUS
INTRAVENOUS | Status: DC | PRN
  Administered 2015-06-02 (×2): 10 mg via INTRAVENOUS
  Administered 2015-06-02: 30 mg via INTRAVENOUS
  Administered 2015-06-02: 180 mg via INTRAVENOUS
  Administered 2015-06-02: 20 mg via INTRAVENOUS

## 2015-06-02 MED ORDER — FENTANYL CITRATE (PF) 100 MCG/2ML IJ SOLN
INTRAMUSCULAR | Status: DC | PRN
  Administered 2015-06-02 (×2): 25 ug via INTRAVENOUS
  Administered 2015-06-02: 50 ug via INTRAVENOUS

## 2015-06-02 MED ORDER — LACTATED RINGERS IV SOLN
INTRAVENOUS | Status: DC
  Filled 2015-06-02: qty 1000

## 2015-06-02 MED ORDER — CEFAZOLIN SODIUM-DEXTROSE 2-3 GM-% IV SOLR
2.0000 g | Freq: Once | INTRAVENOUS | Status: AC
  Administered 2015-06-02: 2 g via INTRAVENOUS
  Filled 2015-06-02: qty 50

## 2015-06-02 MED ORDER — SODIUM CHLORIDE 0.9 % IV SOLN
10.0000 mg | INTRAVENOUS | Status: DC | PRN
  Administered 2015-06-02: 50 ug/min via INTRAVENOUS

## 2015-06-02 MED ORDER — CEFAZOLIN SODIUM 1-5 GM-% IV SOLN
1.0000 g | Freq: Once | INTRAVENOUS | Status: DC
  Filled 2015-06-02: qty 50

## 2015-06-02 MED ORDER — LIDOCAINE HCL (CARDIAC) 20 MG/ML IV SOLN
INTRAVENOUS | Status: DC | PRN
  Administered 2015-06-02: 60 mg via INTRAVENOUS

## 2015-06-02 MED ORDER — PROMETHAZINE HCL 25 MG/ML IJ SOLN
6.2500 mg | INTRAMUSCULAR | Status: DC | PRN
  Filled 2015-06-02: qty 1

## 2015-06-02 MED ORDER — BELLADONNA ALKALOIDS-OPIUM 16.2-60 MG RE SUPP
RECTAL | Status: AC
  Filled 2015-06-02: qty 1

## 2015-06-02 MED ORDER — FLEET ENEMA 7-19 GM/118ML RE ENEM
1.0000 | ENEMA | Freq: Once | RECTAL | Status: AC
Start: 2015-06-03 — End: 2015-06-02
  Administered 2015-06-02: 1 via RECTAL
  Filled 2015-06-02: qty 1

## 2015-06-02 MED ORDER — MIDAZOLAM HCL 5 MG/5ML IJ SOLN
INTRAMUSCULAR | Status: DC | PRN
  Administered 2015-06-02: 1 mg via INTRAVENOUS

## 2015-06-02 MED ORDER — IOHEXOL 350 MG/ML SOLN
INTRAVENOUS | Status: DC | PRN
  Administered 2015-06-02: 7 mL

## 2015-06-02 MED ORDER — CEFAZOLIN SODIUM-DEXTROSE 2-3 GM-% IV SOLR
INTRAVENOUS | Status: AC
  Filled 2015-06-02: qty 50

## 2015-06-02 MED ORDER — SUCCINYLCHOLINE CHLORIDE 20 MG/ML IJ SOLN
INTRAMUSCULAR | Status: DC | PRN
  Administered 2015-06-02: 100 mg via INTRAVENOUS

## 2015-06-02 MED ORDER — STERILE WATER FOR IRRIGATION IR SOLN
Status: DC | PRN
  Administered 2015-06-02: 3000 mL

## 2015-06-02 MED ORDER — MIDAZOLAM HCL 2 MG/2ML IJ SOLN
INTRAMUSCULAR | Status: AC
  Filled 2015-06-02: qty 2

## 2015-06-02 MED ORDER — EPHEDRINE SULFATE 50 MG/ML IJ SOLN
INTRAMUSCULAR | Status: DC | PRN
  Administered 2015-06-02 (×5): 10 mg via INTRAVENOUS

## 2015-06-02 MED ORDER — CEPHALEXIN 500 MG PO CAPS: 500.0000 mg | ORAL_CAPSULE | Freq: Three times a day (TID) | ORAL | Status: DC

## 2015-06-02 MED ORDER — KETOROLAC TROMETHAMINE 30 MG/ML IJ SOLN
INTRAMUSCULAR | Status: DC | PRN
  Administered 2015-06-02: 15 mg via INTRAVENOUS

## 2015-06-02 MED ORDER — LACTATED RINGERS IV SOLN
INTRAVENOUS | Status: DC
  Administered 2015-06-02 (×2): via INTRAVENOUS
  Filled 2015-06-02: qty 1000

## 2015-06-02 MED ORDER — ONDANSETRON HCL 4 MG/2ML IJ SOLN
INTRAMUSCULAR | Status: DC | PRN
Start: 2015-06-02 — End: 2015-06-02
  Administered 2015-06-02: 4 mg via INTRAVENOUS

## 2015-06-02 MED ORDER — TAMSULOSIN HCL 0.4 MG PO CAPS: 0.4000 mg | ORAL_CAPSULE | Freq: Every day | ORAL | Status: DC

## 2015-06-02 MED ORDER — PHENYLEPHRINE HCL 10 MG/ML IJ SOLN
INTRAMUSCULAR | Status: DC | PRN
  Administered 2015-06-02 (×2): 40 ug via INTRAVENOUS

## 2015-06-02 SURGICAL SUPPLY — 27 items
BAG URINE DRAINAGE (UROLOGICAL SUPPLIES) ×2 IMPLANT
BLADE CLIPPER SURG (BLADE) ×2 IMPLANT
CATH FOLEY 2WAY SLVR  5CC 16FR (CATHETERS) ×2
CATH FOLEY 2WAY SLVR 5CC 16FR (CATHETERS) ×2 IMPLANT
CATH ROBINSON RED A/P 20FR (CATHETERS) ×2 IMPLANT
CLOTH BEACON ORANGE TIMEOUT ST (SAFETY) ×2 IMPLANT
COVER BACK TABLE 60X90IN (DRAPES) ×2 IMPLANT
COVER MAYO STAND STRL (DRAPES) ×2 IMPLANT
DRSG TEGADERM 4X4.75 (GAUZE/BANDAGES/DRESSINGS) ×2 IMPLANT
DRSG TEGADERM 8X12 (GAUZE/BANDAGES/DRESSINGS) ×2 IMPLANT
GLOVE BIO SURGEON STRL SZ7.5 (GLOVE) ×4 IMPLANT
GLOVE BIOGEL M 6.5 STRL (GLOVE) ×2 IMPLANT
GLOVE BIOGEL PI IND STRL 6.5 (GLOVE) ×2 IMPLANT
GLOVE BIOGEL PI INDICATOR 6.5 (GLOVE) ×2
GLOVE ECLIPSE 8.0 STRL XLNG CF (GLOVE) ×10 IMPLANT
GOWN STRL REUS W/ TWL LRG LVL3 (GOWN DISPOSABLE) ×1 IMPLANT
GOWN STRL REUS W/ TWL XL LVL3 (GOWN DISPOSABLE) ×1 IMPLANT
GOWN STRL REUS W/TWL LRG LVL3 (GOWN DISPOSABLE) ×1
GOWN STRL REUS W/TWL XL LVL3 (GOWN DISPOSABLE) ×3 IMPLANT
HOLDER FOLEY CATH W/STRAP (MISCELLANEOUS) ×2 IMPLANT
MANIFOLD NEPTUNE II (INSTRUMENTS) ×2 IMPLANT
PACK CYSTO (CUSTOM PROCEDURE TRAY) ×2 IMPLANT
SPONGE GAUZE 4X4 12PLY STER LF (GAUZE/BANDAGES/DRESSINGS) ×2 IMPLANT
SYRINGE 10CC LL (SYRINGE) ×2 IMPLANT
UNDERPAD 30X30 INCONTINENT (UNDERPADS AND DIAPERS) ×4 IMPLANT
WATER STERILE IRR 500ML POUR (IV SOLUTION) ×2 IMPLANT
nucletron selectseed ×126 IMPLANT

## 2015-06-02 NOTE — Anesthesia Procedure Notes (Signed)
Procedure Name: Intubation Date/Time: 06/02/2015 11:13 AM Performed by: Mechele Claude Pre-anesthesia Checklist: Patient identified, Emergency Drugs available, Suction available and Patient being monitored Patient Re-evaluated:Patient Re-evaluated prior to inductionOxygen Delivery Method: Circle System Utilized Preoxygenation: Pre-oxygenation with 100% oxygen Intubation Type: IV induction and Cricoid Pressure applied Ventilation: Mask ventilation without difficulty Grade View: Grade II Tube type: Oral Tube size: 8.0 mm Number of attempts: 1 Airway Equipment and Method: Stylet and LTA kit utilized Placement Confirmation: ETT inserted through vocal cords under direct vision,  positive ETCO2 and breath sounds checked- equal and bilateral Secured at: 22 cm Tube secured with: Tape Dental Injury: Teeth and Oropharynx as per pre-operative assessment

## 2015-06-02 NOTE — Anesthesia Preprocedure Evaluation (Addendum)
Anesthesia Evaluation  Patient identified by MRN, date of birth, ID band Patient awake    Reviewed: Allergy & Precautions, NPO status , Patient's Chart, lab work & pertinent test results  Airway Mallampati: II  TM Distance: >3 FB Neck ROM: Full    Dental  (+) Teeth Intact   Pulmonary neg pulmonary ROS,    breath sounds clear to auscultation       Cardiovascular hypertension, Pt. on medications  Rhythm:Regular Rate:Normal     Neuro/Psych negative neurological ROS  negative psych ROS   GI/Hepatic Neg liver ROS, GERD  Medicated,  Endo/Other  negative endocrine ROS  Renal/GU negative Renal ROS  negative genitourinary   Musculoskeletal negative musculoskeletal ROS (+)   Abdominal   Peds negative pediatric ROS (+)  Hematology negative hematology ROS (+)   Anesthesia Other Findings   Reproductive/Obstetrics negative OB ROS                            Lab Results  Component Value Date   WBC 12.5* 05/26/2015   HGB 14.2 05/26/2015   HCT 43.0 05/26/2015   MCV 87.2 05/26/2015   PLT 305 05/26/2015   Lab Results  Component Value Date   CREATININE 1.48* 05/26/2015   BUN 18 05/26/2015   NA 139 05/26/2015   K 3.4* 05/26/2015   CL 106 05/26/2015   CO2 28 05/26/2015   Lab Results  Component Value Date   INR 0.96 05/26/2015   EKG: normal sinus rhythm.   Anesthesia Physical Anesthesia Plan  ASA: II  Anesthesia Plan: General   Post-op Pain Management:    Induction: Intravenous  Airway Management Planned: Oral ETT  Additional Equipment:   Intra-op Plan:   Post-operative Plan: Extubation in OR  Informed Consent: I have reviewed the patients History and Physical, chart, labs and discussed the procedure including the risks, benefits and alternatives for the proposed anesthesia with the patient or authorized representative who has indicated his/her understanding and acceptance.    Dental advisory given  Plan Discussed with: CRNA  Anesthesia Plan Comments: (Mr. Flanery states he had cup of coffee with "2 spoons of creamer" this morning at 5:30 - 6 AM. We will proceed with ETT. )       Anesthesia Quick Evaluation

## 2015-06-02 NOTE — Anesthesia Postprocedure Evaluation (Signed)
  Anesthesia Post-op Note  Patient: Alejandro Krueger  Procedure(s) Performed: Procedure(s) with comments: RADIOACTIVE SEED IMPLANT/BRACHYTHERAPY IMPLANT (N/A) - 63 seeds implanted no seeds in bladder  Patient Location: PACU  Anesthesia Type:General  Level of Consciousness: awake, alert  and oriented  Airway and Oxygen Therapy: Patient Spontanous Breathing  Post-op Pain: mild  Post-op Assessment: Post-op Vital signs reviewed and Patient's Cardiovascular Status Stable              Post-op Vital Signs: Reviewed and stable  Last Vitals:  Filed Vitals:   06/02/15 1315  BP: 136/64  Pulse: 94  Temp: 36.4 C  Resp: 16    Complications: No apparent anesthesia complications

## 2015-06-02 NOTE — Interval H&P Note (Signed)
History and Physical Interval Note:  06/02/2015 11:05 AM  Alejandro Krueger  has presented today for surgery, with the diagnosis of PROSTATE CANCER  The various methods of treatment have been discussed with the patient and family. After consideration of risks, benefits and other options for treatment, the patient has consented to  Procedure(s): RADIOACTIVE SEED IMPLANT/BRACHYTHERAPY IMPLANT (N/A) as a surgical intervention .  The patient's history has been reviewed, patient examined, no change in status, stable for surgery.  I have reviewed the patient's chart and labs.  Questions were answered to the patient's satisfaction.     Maggy Wyble

## 2015-06-02 NOTE — Transfer of Care (Signed)
  Last Vitals:  Filed Vitals:   06/02/15 0951  BP: 154/77  Pulse: 69  Temp: 36.6 C  Resp: 16   Immediate Anesthesia Transfer of Care Note  Patient: Alejandro Krueger  Procedure(s) Performed: Procedure(s) (LRB): RADIOACTIVE SEED IMPLANT/BRACHYTHERAPY IMPLANT (N/A)  Patient Location: PACU  Anesthesia Type: General  Level of Consciousness: awake, alert  and oriented  Airway & Oxygen Therapy: Patient Spontanous Breathing and Patient connected to face mask oxygen  Post-op Assessment: Report given to PACU RN and Post -op Vital signs reviewed and stable  Post vital signs: Reviewed and stable  Complications: No apparent anesthesia complications

## 2015-06-02 NOTE — Op Note (Signed)
Preoperative diagnosis: Prostate cancer Postoperative diagnosis: Prostate cancer  Procedure: #1 Insertion of radioactive I-125 seeds into the prostate gland #2 cystoscopy #3 Foley catheter placement   Surgeon: Junious Silk Assistant: Tammi Klippel  Anesthesia: General  Indications for procedure: A 74 year old with Gleason 3+3 = 6 and a small amount of Gleason 3+4 = 7 prostate cancer.  Procedure: After consent was obtained patient brought to the operating room.  After adequate anesthesia he was placed in lithotomy position and the external genitalia and perineum were prepped and draped in the usual sterile fashion.  A Foley catheter and rectal tube were placed.  The transrectal ultrasound probe was inserted and treatment planning undertaken.  After this was complete, 63 I-125 seeds were placed via 24 needles using the needle grid for a prescribed dose of 145 Gy. Fluoroscopic visualization revealed good seed placement.  Cystourethroscopy was performed which revealed normal urethra and bladder without evidence of radioactive seeds or other obvious injury.  The cystoscope was removed and a 74 French Foley catheter replaced and left to gravity drainage.  Urine was clear. The patient was awakened taken to recovery room in stable condition.  Complications: None Blood loss: Minimal Specimens: None Drains: 16 French Foley catheter

## 2015-06-02 NOTE — Progress Notes (Signed)
  Radiation Oncology         [phone] ________________________________  Name: Alejandro Krueger MRN: [id number]  Date: 06/02/2015  DOB: [birth year]       Prostate Seed Implant  Alejandro Nevada Crane, MD  No ref. provider found  DIAGNOSIS: 74 y.o. gentleman with stage T1c adenocarcinoma of the prostate with a Gleason's score of 3+4 and a PSA of 5.68   ICD-9-CM ICD-10-CM   1. Pre-op testing V72.84 Z01.818 DG Chest 2 View     DG Chest 2 View    PROCEDURE: Insertion of radioactive I-125 seeds into the prostate gland.  RADIATION DOSE: 145 Gy, definitive therapy.  TECHNIQUE: Alejandro Krueger was brought to the operating room with the urologist. He was placed in the dorsolithotomy position. He was catheterized and a rectal tube was inserted. The perineum was shaved, prepped and draped. The ultrasound probe was then introduced into the rectum to see the prostate gland.  TREATMENT DEVICE: A needle grid was attached to the ultrasound probe stand and anchor needles were placed.  3D PLANNING: The prostate was imaged in 3D using a sagittal sweep of the prostate probe. These images were transferred to the planning computer. There, the prostate, urethra and rectum were defined on each axial reconstructed image. Then, the software created an optimized 3D plan and a few seed positions were adjusted. The quality of the plan was reviewed using Alejandro Krueger information for the target and the following two organs at risk:  Urethra and Rectum.  Then the accepted plan was uploaded to the seed Selectron afterloading unit.  PROSTATE VOLUME STUDY:  Using transrectal ultrasound the volume of the prostate was verified to be 32.7 cc.  SPECIAL TREATMENT PROCEDURE/SUPERVISION AND HANDLING: The Nucletron FIRST system was used to place the needles under sagittal guidance. A total of 24 needles were used to deposit 63 seeds in the prostate gland. The individual seed activity was 0.422 mCi.  COMPLEX SIMULATION: At the end of the procedure,  an anterior radiograph of the pelvis was obtained to document seed positioning and count. Cystoscopy was performed to check the urethra and bladder.  MICRODOSIMETRY: At the end of the procedure, the patient was emitting 0.070 mR/hr at 1 meter. Accordingly, he was considered safe for hospital discharge.  PLAN: The patient will return to the radiation oncology clinic for post implant CT dosimetry in three weeks.   ________________________________  Alejandro Krueger, M.D.

## 2015-06-02 NOTE — Discharge Instructions (Addendum)
Brachytherapy for Prostate Cancer, Care After Refer to this sheet in the next few weeks. These instructions provide you with information on caring for yourself after your procedure. Your health care provider may also give you more specific instructions. Your treatment has been planned according to current medical practices, but problems sometimes occur. Call your health care provider if you have any problems or questions after your procedure. WHAT TO EXPECT AFTER THE PROCEDURE The area behind the scrotum will probably be tender and bruised. For a short period of time you may have:  Difficulty passing urine. You may need a catheter for a few days to a month.  Blood in the urine or semen.  A feeling of constipation because of prostate swelling.  Frequent feeling of an urgent need to urinate. HOME CARE INSTRUCTIONS   Take medicines only as directed by your health care provider.  You will probably have a catheter in your bladder for several days. You will have blood in the urine bag and should drink a lot of fluids to keep it a light red color.  Keep all follow-up visits as directed by your health care provider. If you have a catheter, it will be removed during one of these visits.  Try not to sit directly on the area behind the scrotum. A soft cushion can decrease the discomfort. Ice packs may also be helpful for the discomfort. Do not put ice directly on the skin.  Shower and wash the area behind the scrotum gently. Do not sit in a tub.  If you have had the brachytherapy that uses the seeds, limit your close contact with children and pregnant women for 2 months because of the radiation still in the prostate. After that period of time, the levels drop off quickly. SEEK IMMEDIATE MEDICAL CARE IF:   You have a fever.  You have chills.  You have shortness of breath.  You have chest pain.  You have thick blood, like tomato juice, in the urine bag.  Your catheter is blocked so urine  cannot get into the bag. Your bladder area or lower abdomen may be swollen.  There is excessive bleeding from your rectum. It is normal to have a little blood mixed with your stool.  There is severe discomfort in the treated area that does not go away with pain medicine.  You have abdominal discomfort.  You have severe nausea or vomiting.  You develop any new or unusual symptoms.   This information is not intended to replace advice given to you by your health care provider. Make sure you discuss any questions you have with your health care provider.   Document Released: 09/14/2010 Document Revised: 09/02/2014 Document Reviewed: 02/02/2013 Elsevier Interactive Patient Education 2016 [place], Adult A Foley catheter is a soft, flexible tube. This tube is placed into your bladder to drain pee (urine). If you go home with this catheter in place, follow the instructions below. TAKING CARE OF THE CATHETER  Wash your hands with soap and water.  Put soap and water on a clean washcloth.  Clean the skin where the tube goes into your body.  Clean away from the tube site.  Never wipe toward the tube.  Clean the area using a circular motion.  Remove all the soap. Pat the area dry with a clean towel. For males, reposition the skin that covers the end of the penis (foreskin).  Attach the tube to your leg with tape or a leg strap. Do  not stretch the tube tight. If you are using tape, remove any stickiness left behind by past tape you used.  Keep the drainage bag below your hips. Keep it off the floor.  Check your tube during the day. Make sure it is working and draining. Make sure the tube does not curl, twist, or bend.  Do not pull on the tube or try to take it out. TAKING CARE OF THE DRAINAGE BAGS You will have a large overnight drainage bag and a small leg bag. You may wear the overnight bag any time. Never wear the small bag at night. Follow the directions  below. Emptying the Drainage Bag Empty your drainage bag when it is  - full or at least 2-3 times a day.  Wash your hands with soap and water.  Keep the drainage bag below your hips.  Hold the dirty bag over the toilet or clean container.  Open the pour spout at the bottom of the bag. Empty the pee into the toilet or container. Do not let the pour spout touch anything.  Clean the pour spout with a gauze pad or cotton ball that has rubbing alcohol on it.  Close the pour spout.  Attach the bag to your leg with tape or a leg strap.  Wash your hands well. Changing the Drainage Bag Change your bag once a month or sooner if it starts to smell or look dirty.   Wash your hands with soap and water.  Pinch the rubber tube so that pee does not spill out.  Disconnect the catheter tube from the drainage tube at the connection valve. Do not let the tubes touch anything.  Clean the end of the catheter tube with an alcohol wipe. Clean the end of a the drainage tube with a different alcohol wipe.  Connect the catheter tube to the drainage tube of the clean drainage bag.  Attach the new bag to the leg with tape or a leg strap. Avoid attaching the new bag too tightly.  Wash your hands well. Cleaning the Drainage Bag  Wash your hands with soap and water.  Wash the bag in warm, soapy water.  Rinse the bag with warm water.  Fill the bag with a mixture of white vinegar and water (1 cup vinegar to 1 quart warm water [.2 liter vinegar to 1 liter warm water]). Close the bag and soak it for 30 minutes in the solution.  Rinse the bag with warm water.  Hang the bag to dry with the pour spout open and hanging downward.  Store the clean bag (once it is dry) in a clean plastic bag.  Wash your hands well. PREVENT INFECTION  Wash your hands before and after touching your tube.  Take showers every day. Wash the skin where the tube enters your body. Do not take baths. Replace wet leg straps with  dry ones, if this applies.  Do not use powders, sprays, or lotions on the genital area. Only use creams, lotions, or ointments as told by your doctor.  For females, wipe from front to back after going to the bathroom.  Drink enough fluids to keep your pee clear or pale yellow unless you are told not to have too much fluid (fluid restriction).  Do not let the drainage bag or tubing touch or lie on the floor.  Wear cotton underwear to keep the area dry. GET HELP IF:  Your pee is cloudy or smells unusually bad.  Your tube becomes clogged.  You are not draining pee into the bag or your bladder feels full.  Your tube starts to leak. GET HELP RIGHT AWAY IF:  You have pain, puffiness (swelling), redness, or yellowish-white fluid (pus) where the tube enters the body.  You have pain in the belly (abdomen), legs, lower back, or bladder.  You have a fever.  You see blood fill the tube, or your pee is pink or red.  You feel sick to your stomach (nauseous), throw up (vomit), or have chills.  Your tube gets pulled out. MAKE SURE YOU:   Understand these instructions.  Will watch your condition.  Will get help right away if you are not doing well or get worse.   This information is not intended to replace advice given to you by your health care provider. Make sure you discuss any questions you have with your health care provider.   Document Released: 12/07/2012 Document Revised: 09/02/2014 Document Reviewed: 12/07/2012 General Anesthesia, Adult General anesthesia is a sleep-like state of non-feeling produced by medicines (anesthetics). General anesthesia prevents you from being alert and feeling pain during a medical procedure. Your caregiver may recommend general anesthesia if your procedure:  Is long.  Is painful or uncomfortable.  Would be frightening to see or hear.  Requires you to be still.  Affects your breathing.  Causes significant blood loss. LET YOUR CAREGIVER  KNOW ABOUT:  Allergies to food or medicine.  Medicines taken, including vitamins, herbs, eyedrops, over-the-counter medicines, and creams.  Use of steroids (by mouth or creams).  Previous problems with anesthetics or numbing medicines, including problems experienced by relatives.  History of bleeding problems or blood clots.  Previous surgeries and types of anesthetics received.  Possibility of pregnancy, if this applies.  Use of cigarettes, alcohol, or illegal drugs.  Any health condition(s), especially diabetes, sleep apnea, and high blood pressure. RISKS AND COMPLICATIONS General anesthesia rarely causes complications. However, if complications do occur, they can be life threatening. Complications include:  A lung infection.  A stroke.  A heart attack.  Waking up during the procedure. When this occurs, the patient may be unable to move and communicate that he or she is awake. The patient may feel severe pain. Older adults and adults with serious medical problems are more likely to have complications than adults who are young and healthy. Some complications can be prevented by answering all of your caregiver's questions thoroughly and by following all pre-procedure instructions. It is important to tell your caregiver if any of the pre-procedure instructions, especially those related to diet, were not followed. Any food or liquid in the stomach can cause problems when you are under general anesthesia. BEFORE THE PROCEDURE  Ask your caregiver if you will have to spend the night at the hospital. If you will not have to spend the night, arrange to have an adult drive you and stay with you for 24 hours.  Follow your caregiver's instructions if you are taking dietary supplements or medicines. Your caregiver may tell you to stop taking them or to reduce your dosage.  Do not smoke for as long as possible before your procedure. If possible, stop smoking 3-6 weeks before the  procedure.  Do not take new dietary supplements or medicines within 1 week of your procedure unless your caregiver approves them.  Do not eat within 8 hours of your procedure or as directed by your caregiver. Drink only clear liquids, such as water, black coffee (without milk or cream), and fruit juices (without  pulp).  Do not drink within 3 hours of your procedure or as directed by your caregiver.  You may brush your teeth on the morning of the procedure, but make sure to spit out the toothpaste and water when finished. PROCEDURE  You will receive anesthetics through a mask, through an intravenous (IV) access tube, or through both. A doctor who specializes in anesthesia (anesthesiologist) or a nurse who specializes in anesthesia (nurse anesthetist) or both will stay with you throughout the procedure to make sure you remain unconscious. He or she will also watch your blood pressure, pulse, and oxygen levels to make sure that the anesthetics do not cause any problems. Once you are asleep, a breathing tube or mask may be used to help you breathe. AFTER THE PROCEDURE You will wake up after the procedure is complete. You may be in the room where the procedure was performed or in a recovery area. You may have a sore throat if a breathing tube was used. You may also feel:  Dizzy.  Weak.  Drowsy.  Confused.  Nauseous.  Cold. These are all normal responses and can be expected to last for up to 24 hours after the procedure is complete. A caregiver will tell you when you are ready to go home. This will usually be when you are fully awake and in stable condition.   This information is not intended to replace advice given to you by your health care provider. Make sure you discuss any questions you have with your health care provider.   Document Released: 11/19/2007 Document Revised: 09/02/2014 Document Reviewed: 12/11/2011 Elsevier Interactive Patient Education 2016 [employer]. Civil engineer, contracting Patient Education [employer].

## 2015-06-05 ENCOUNTER — Encounter (HOSPITAL_BASED_OUTPATIENT_CLINIC_OR_DEPARTMENT_OTHER): Payer: Self-pay | Admitting: Urology

## 2015-06-05 DIAGNOSIS — R339 Retention of urine, unspecified: Secondary | ICD-10-CM | POA: Diagnosis not present

## 2015-06-22 ENCOUNTER — Telehealth: Payer: Self-pay | Admitting: *Deleted

## 2015-06-22 NOTE — Telephone Encounter (Signed)
CALLED PATIENT TO REMIND OF APPTS. FOR 06-23-15, LVM FOR A RETURN CALL

## 2015-06-22 NOTE — Telephone Encounter (Signed)
XXXX 

## 2015-06-23 ENCOUNTER — Ambulatory Visit
Admit: 2015-06-23 | Discharge: 2015-06-23 | Disposition: A | Discharge status: Home / Self Care | Payer: Medicare Other | Attending: Radiation Oncology | Admitting: Radiation Oncology

## 2015-06-23 ENCOUNTER — Encounter: Payer: Self-pay | Admitting: Radiation Oncology

## 2015-06-23 VITALS — BP 141/69 | HR 64 | Temp 98.0°F | Resp 18

## 2015-06-23 DIAGNOSIS — K219 Gastro-esophageal reflux disease without esophagitis: Secondary | ICD-10-CM | POA: Diagnosis not present

## 2015-06-23 DIAGNOSIS — Z510 Encounter for antineoplastic radiation therapy: Secondary | ICD-10-CM | POA: Diagnosis not present

## 2015-06-23 DIAGNOSIS — Z800 Family history of malignant neoplasm of digestive organs: Secondary | ICD-10-CM | POA: Diagnosis not present

## 2015-06-23 DIAGNOSIS — Z8249 Family history of ischemic heart disease and other diseases of the circulatory system: Secondary | ICD-10-CM | POA: Diagnosis not present

## 2015-06-23 DIAGNOSIS — Z833 Family history of diabetes mellitus: Secondary | ICD-10-CM | POA: Diagnosis not present

## 2015-06-23 DIAGNOSIS — C61 Malignant neoplasm of prostate: Secondary | ICD-10-CM | POA: Insufficient documentation

## 2015-06-23 DIAGNOSIS — D126 Benign neoplasm of colon, unspecified: Secondary | ICD-10-CM | POA: Diagnosis not present

## 2015-06-23 DIAGNOSIS — I10 Essential (primary) hypertension: Secondary | ICD-10-CM | POA: Diagnosis not present

## 2015-06-23 MED ORDER — TAMSULOSIN HCL 0.4 MG PO CAPS: 0.4000 mg | ORAL_CAPSULE | Freq: Every day | ORAL | Status: DC

## 2015-06-23 NOTE — Progress Notes (Addendum)
Alejandro Krueger here for post seed follow up.  He denies pain.  His IPSS score today is 24.  He reports incomplete emptying of his bladder most of the time.  He reports having constipation starting Saturday and he took Miralax.  After that, he started feeling like he has to urinate and have a bowel movement at the same time mostly at night.  He reports having to get up at least 4 times a night to urinate.  He denies hematuria.  He reports having to strain to start his urinary stream.  He has Flomax on his med list but is not currently taking it.  He reports having something fly in his left eye last Saturday.  His left eye is very red.  He denies having any vision changes.  BP 141/69 mmHg  Pulse 64  Temp(Src) 98 F (36.7 C) (Oral)  Resp 18

## 2015-06-23 NOTE — Progress Notes (Signed)
  Radiation Oncology         [phone] ________________________________  Name: Alejandro Krueger MRN: [id number]  Date: 06/23/2015  DOB: July 31, 1941  COMPLEX SIMULATION NOTE  NARRATIVE:  The patient was brought to the [place] suite today following prostate seed implantation approximately one month ago.  Identity was confirmed.  All relevant records and images related to the planned course of therapy were reviewed.  Then, the patient was set-up supine.  CT images were obtained.  The CT images were loaded into the planning software.  Then the prostate and rectum were contoured.  Treatment planning then occurred.  The implanted iodine 125 seeds were identified by the physics staff for projection of radiation distribution  I have requested : 3D Simulation  I have requested a DVH of the following structures: Prostate and rectum.    ________________________________  Sheral Apley Tammi Klippel, M.D.  This document serves as a record of services personally performed by Tyler Pita, MD. It was created on his behalf by Lendon Collar, a trained medical scribe. The creation of this record is based on the scribe's personal observations and the provider's statements to them. This document has been checked and approved by the attending provider.

## 2015-06-23 NOTE — Progress Notes (Signed)
Radiation Oncology         [phone] ________________________________  Name: Alejandro Krueger MRN: [id number]  Date: 06/23/2015  DOB: 1941/01/23  Follow-Up Visit Note  CC: [name], MD  [name], MD  Diagnosis:   74 y.o. gentleman with stage T1c adenocarcinoma of the prostate with a Gleason's score of 3+4 and a PSA of 5.68    ICD-9-CM ICD-10-CM   1. Stage T1c Adenocarcinoma of the Prostate with Gleason's 3+4=7 and PSA=5.68 185 C61 tamsulosin (FLOMAX) 0.4 MG CAPS capsule    Interval Since Last Radiation:  06/02/15, 2weeks  Narrative:  The patient returns today for routine follow-up.  He is complaining of increased urinary frequency and urinary hesitation symptoms. He filled out a questionnaire regarding urinary function today providing and overall IPSS score of 24 characterizing his symptoms as severe.  His pre-implant score was 4. [name] here for post seed follow up. He denies pain. His IPSS score today is 24. He reports incomplete emptying of his bladder most of the time. He reports having constipation starting Saturday and he took Miralax. After that, he started feeling like he has to urinate and have a bowel movement at the same time mostly at night. He reports having to get up at least 4 times a night to urinate. He denies hematuria. He reports having to strain to start his urinary stream. He has Flomax on his med list but is not currently taking it.  Patient was supposed to receive prescription of Flomax after his procedure but doesn't recall taking it.  ALLERGIES:  has No Known Allergies.  Meds: Current Outpatient Prescriptions  Medication Sig Dispense Refill  . amLODipine (NORVASC) 5 MG tablet Take 5 mg by mouth daily.    Marland Kitchen buPROPion (WELLBUTRIN XL) 150 MG 24 hr tablet     . lisinopril-hydrochlorothiazide (PRINZIDE,ZESTORETIC) 20-12.5 MG per tablet Take 1 tablet by mouth daily.    Marland Kitchen omeprazole (PRILOSEC) 20 MG capsule Take 20 mg by mouth daily as needed (acid reflux).      . cephALEXin (KEFLEX) 500 MG capsule Take 1 capsule (500 mg total) by mouth 3 (three) times daily. (Patient not taking: Reported on 06/23/2015) 12 capsule 0  . oxyCODONE-acetaminophen (ROXICET) 5-325 MG tablet Take 1 tablet by mouth every 6 (six) hours as needed for severe pain. (Patient not taking: Reported on 06/23/2015) 30 tablet 0  . sildenafil (REVATIO) 20 MG tablet Take 20 mg by mouth 3 (three) times daily.    . tamsulosin (FLOMAX) 0.4 MG CAPS capsule Take 1 capsule (0.4 mg total) by mouth daily after supper. 30 capsule 5   No current facility-administered medications for this encounter.    Physical Findings: The patient is in no acute distress. Patient is alert and oriented.  oral temperature is 98 F (36.7 C). His blood pressure is 141/69 and his pulse is 64. His respiration is 18. .  No significant changes.  Lab Findings: Lab Results  Component Value Date   WBC 12.5* 05/26/2015   HGB 14.2 05/26/2015   HCT 43.0 05/26/2015   MCV 87.2 05/26/2015   PLT 305 05/26/2015    Radiographic Findings:  Patient underwent CT imaging in our clinic for post implant dosimetry. The CT appears to demonstrate an adequate distribution of radioactive seeds throughout the prostate gland. There no seeds in her near the rectum. I suspect the final radiation plan and dosimetry will show appropriate coverage of the prostate gland.   Impression: The patient is recovering from the effects  of radiation. His urinary symptoms should gradually improve over the next 4-6 months. We talked about this today. He is encouraged by his improvement already and is otherwise please with his outcome.   Plan: Today, I spent time talking to the patient about his prostate seed implant and resolving urinary symptoms. We also talked about long-term follow-up for prostate cancer following seed implant. He understands that ongoing PSA determinations and digital rectal exams will help perform surveillance to rule out disease  recurrence. He understands what to expect with his PSA measures. Patient was also educated today about some of the long-term effects from radiation including a small risk for rectal bleeding and possibly erectile dysfunction. We talked about some of the general management approaches to these potential complications. However, I did encourage the patient to contact our office or return at any point if he has questions or concerns related to his previous radiation and prostate cancer.  Prescribed him Flomax to take for the next few months to help with nocturia. _____________________________________  Sheral Apley. Tammi Klippel, M.D.    This document serves as a record of services personally performed by Tyler Pita, MD. It was created on his behalf by Lendon Collar, a trained medical scribe. The creation of this record is based on the scribe's personal observations and the provider's statements to them. This document has been checked and approved by the attending provider.

## 2015-07-13 ENCOUNTER — Encounter: Payer: Self-pay | Admitting: Radiation Oncology

## 2015-07-13 DIAGNOSIS — D126 Benign neoplasm of colon, unspecified: Secondary | ICD-10-CM | POA: Diagnosis not present

## 2015-07-13 DIAGNOSIS — C61 Malignant neoplasm of prostate: Secondary | ICD-10-CM | POA: Diagnosis not present

## 2015-07-13 DIAGNOSIS — K219 Gastro-esophageal reflux disease without esophagitis: Secondary | ICD-10-CM | POA: Diagnosis not present

## 2015-07-13 DIAGNOSIS — Z833 Family history of diabetes mellitus: Secondary | ICD-10-CM | POA: Diagnosis not present

## 2015-07-13 DIAGNOSIS — Z510 Encounter for antineoplastic radiation therapy: Secondary | ICD-10-CM | POA: Diagnosis not present

## 2015-07-13 DIAGNOSIS — Z800 Family history of malignant neoplasm of digestive organs: Secondary | ICD-10-CM | POA: Diagnosis not present

## 2015-07-13 DIAGNOSIS — I10 Essential (primary) hypertension: Secondary | ICD-10-CM | POA: Diagnosis not present

## 2015-07-13 DIAGNOSIS — Z8249 Family history of ischemic heart disease and other diseases of the circulatory system: Secondary | ICD-10-CM | POA: Diagnosis not present

## 2015-08-02 NOTE — Progress Notes (Signed)
  Radiation Oncology         [phone] ________________________________  Name: Alejandro Krueger MRN: [id number]  Date: 07/13/2015  DOB: 07/25/1941  3D Planning Note   Prostate Brachytherapy Post-Implant Dosimetry  Diagnosis: 74 y.o. gentleman with stage T1c adenocarcinoma of the prostate with a Gleason's score of 3+4 and a PSA of 5.68  Narrative: On a previous date, Alejandro Krueger returned following prostate seed implantation for post implant planning. He underwent CT scan complex simulation to delineate the three-dimensional structures of the pelvis and demonstrate the radiation distribution.  Since that time, the seed localization, and complex isodose planning with dose volume histograms have now been completed.  Results:   Prostate Coverage - The dose of radiation delivered to the 90% or more of the prostate gland (D90) was 87% of the prescription dose. This is within close range of our goal of greater than 90% and qualitative review shows no areas of underdosing. Rectal Sparing - The volume of rectal tissue receiving the prescription dose or higher was 0.08 cc. This falls under our thresholds tolerance of 1.0 cc.  Impression: The prostate seed implant appears to show adequate target coverage and appropriate rectal sparing.  Plan:  The patient will continue to follow with urology for ongoing PSA determinations. I would anticipate a high likelihood for local tumor control with minimal risk for rectal morbidity.  ________________________________  Sheral Apley Tammi Klippel, M.D.

## 2015-08-09 DIAGNOSIS — E782 Mixed hyperlipidemia: Secondary | ICD-10-CM | POA: Diagnosis not present

## 2015-08-09 DIAGNOSIS — R7301 Impaired fasting glucose: Secondary | ICD-10-CM | POA: Diagnosis not present

## 2015-08-09 DIAGNOSIS — I10 Essential (primary) hypertension: Secondary | ICD-10-CM | POA: Diagnosis not present

## 2015-08-09 DIAGNOSIS — Z125 Encounter for screening for malignant neoplasm of prostate: Secondary | ICD-10-CM | POA: Diagnosis not present

## 2015-08-11 DIAGNOSIS — K219 Gastro-esophageal reflux disease without esophagitis: Secondary | ICD-10-CM | POA: Diagnosis not present

## 2015-08-11 DIAGNOSIS — D509 Iron deficiency anemia, unspecified: Secondary | ICD-10-CM | POA: Diagnosis not present

## 2015-08-11 DIAGNOSIS — R944 Abnormal results of kidney function studies: Secondary | ICD-10-CM | POA: Diagnosis not present

## 2015-08-11 DIAGNOSIS — I10 Essential (primary) hypertension: Secondary | ICD-10-CM | POA: Diagnosis not present

## 2015-08-11 DIAGNOSIS — E782 Mixed hyperlipidemia: Secondary | ICD-10-CM | POA: Diagnosis not present

## 2015-09-12 DIAGNOSIS — C61 Malignant neoplasm of prostate: Secondary | ICD-10-CM | POA: Diagnosis not present

## 2015-09-12 DIAGNOSIS — R972 Elevated prostate specific antigen [PSA]: Secondary | ICD-10-CM | POA: Diagnosis not present

## 2015-09-18 DIAGNOSIS — N401 Enlarged prostate with lower urinary tract symptoms: Secondary | ICD-10-CM | POA: Diagnosis not present

## 2015-09-18 DIAGNOSIS — R3912 Poor urinary stream: Secondary | ICD-10-CM | POA: Diagnosis not present

## 2015-09-18 DIAGNOSIS — C61 Malignant neoplasm of prostate: Secondary | ICD-10-CM | POA: Diagnosis not present

## 2015-09-18 DIAGNOSIS — Z0000 Encounter for general adult medical examination without abnormal findings: Secondary | ICD-10-CM | POA: Diagnosis not present

## 2015-09-30 IMAGING — CR DG CHEST 2V
2 series · 2 of 2 positions shown · non-contrast
Comparison: Chest x-ray of 01/13/2008

CLINICAL DATA: History of prostate carcinoma, preop for seed
implantation

EXAM:
CHEST  2 VIEW

[w chest pa]
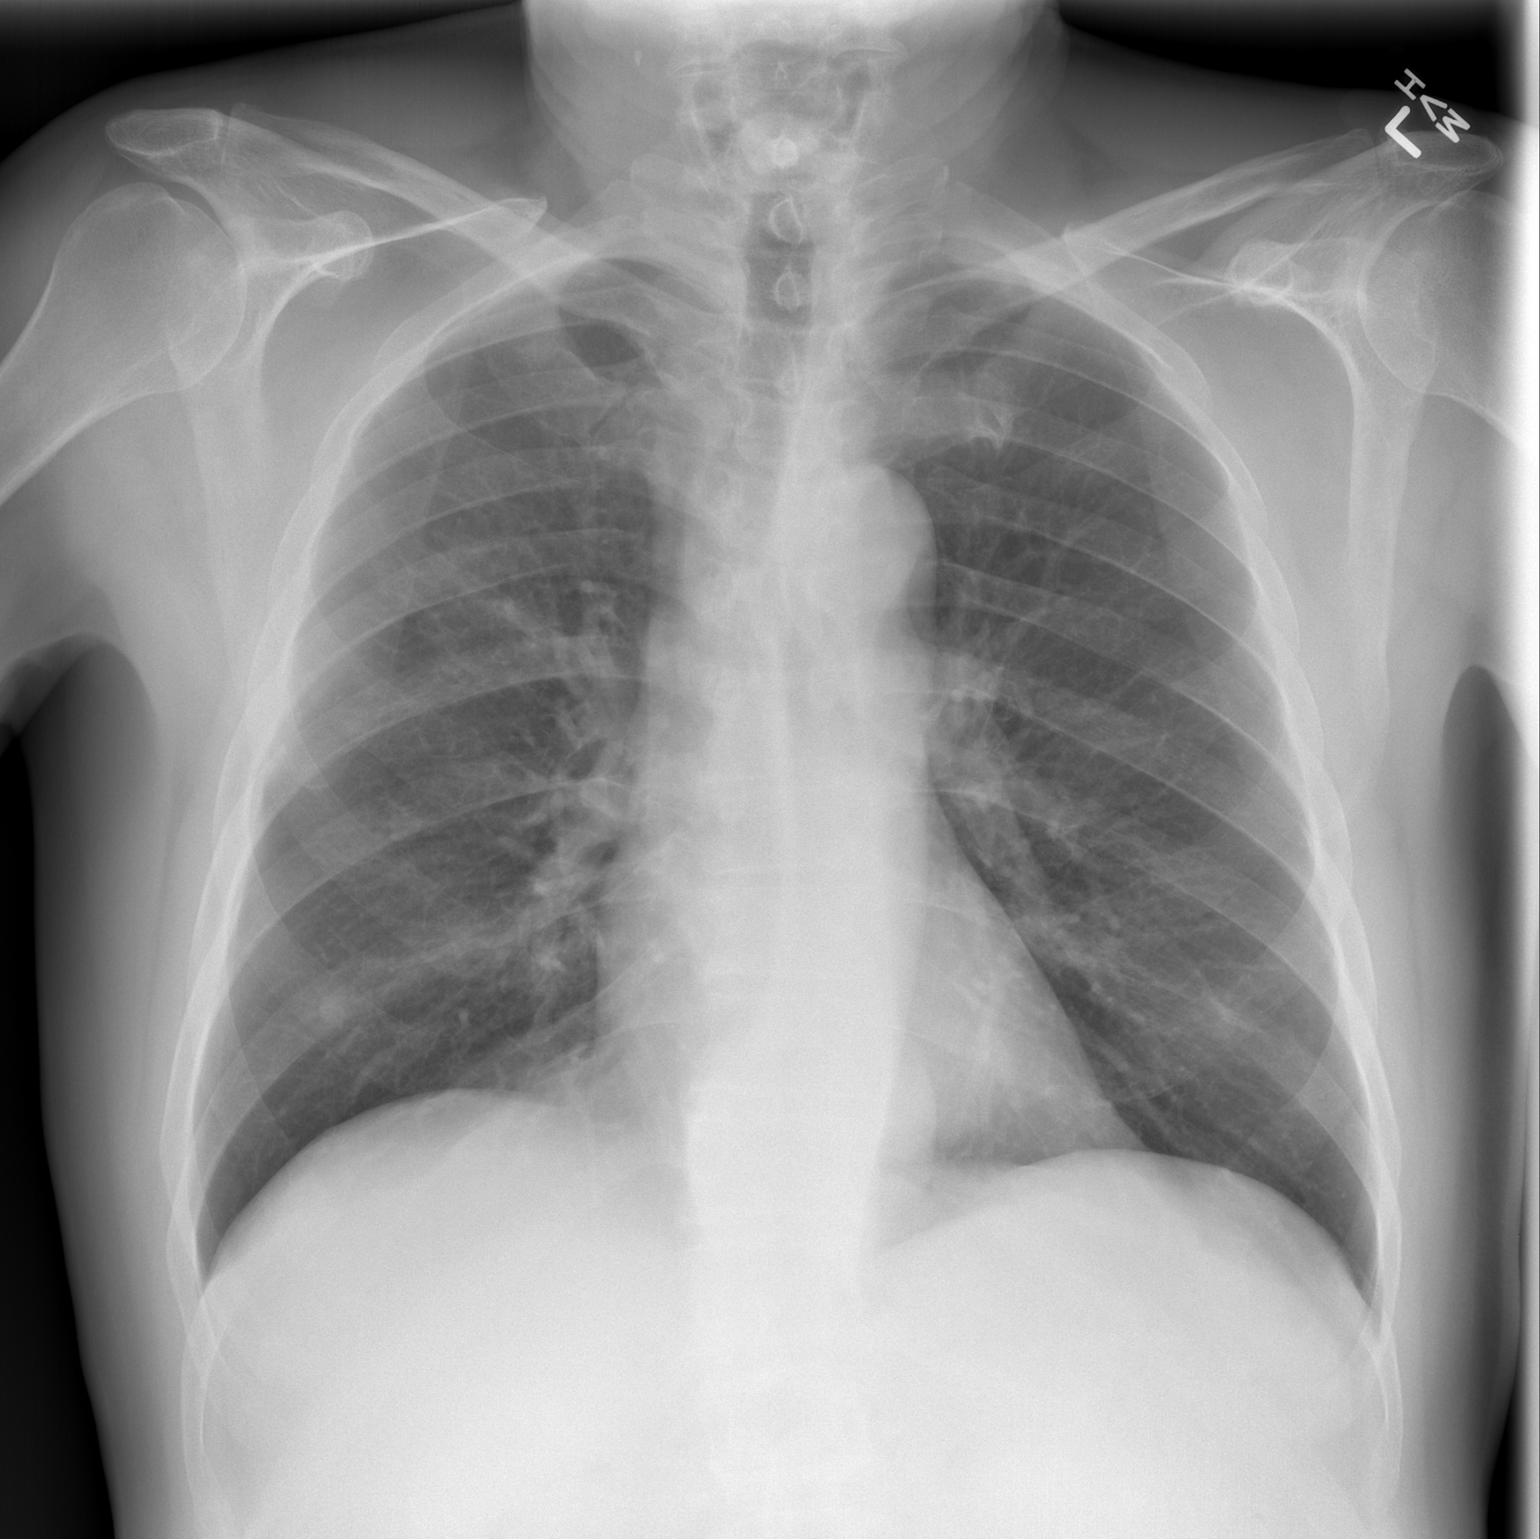

[w chest lat]
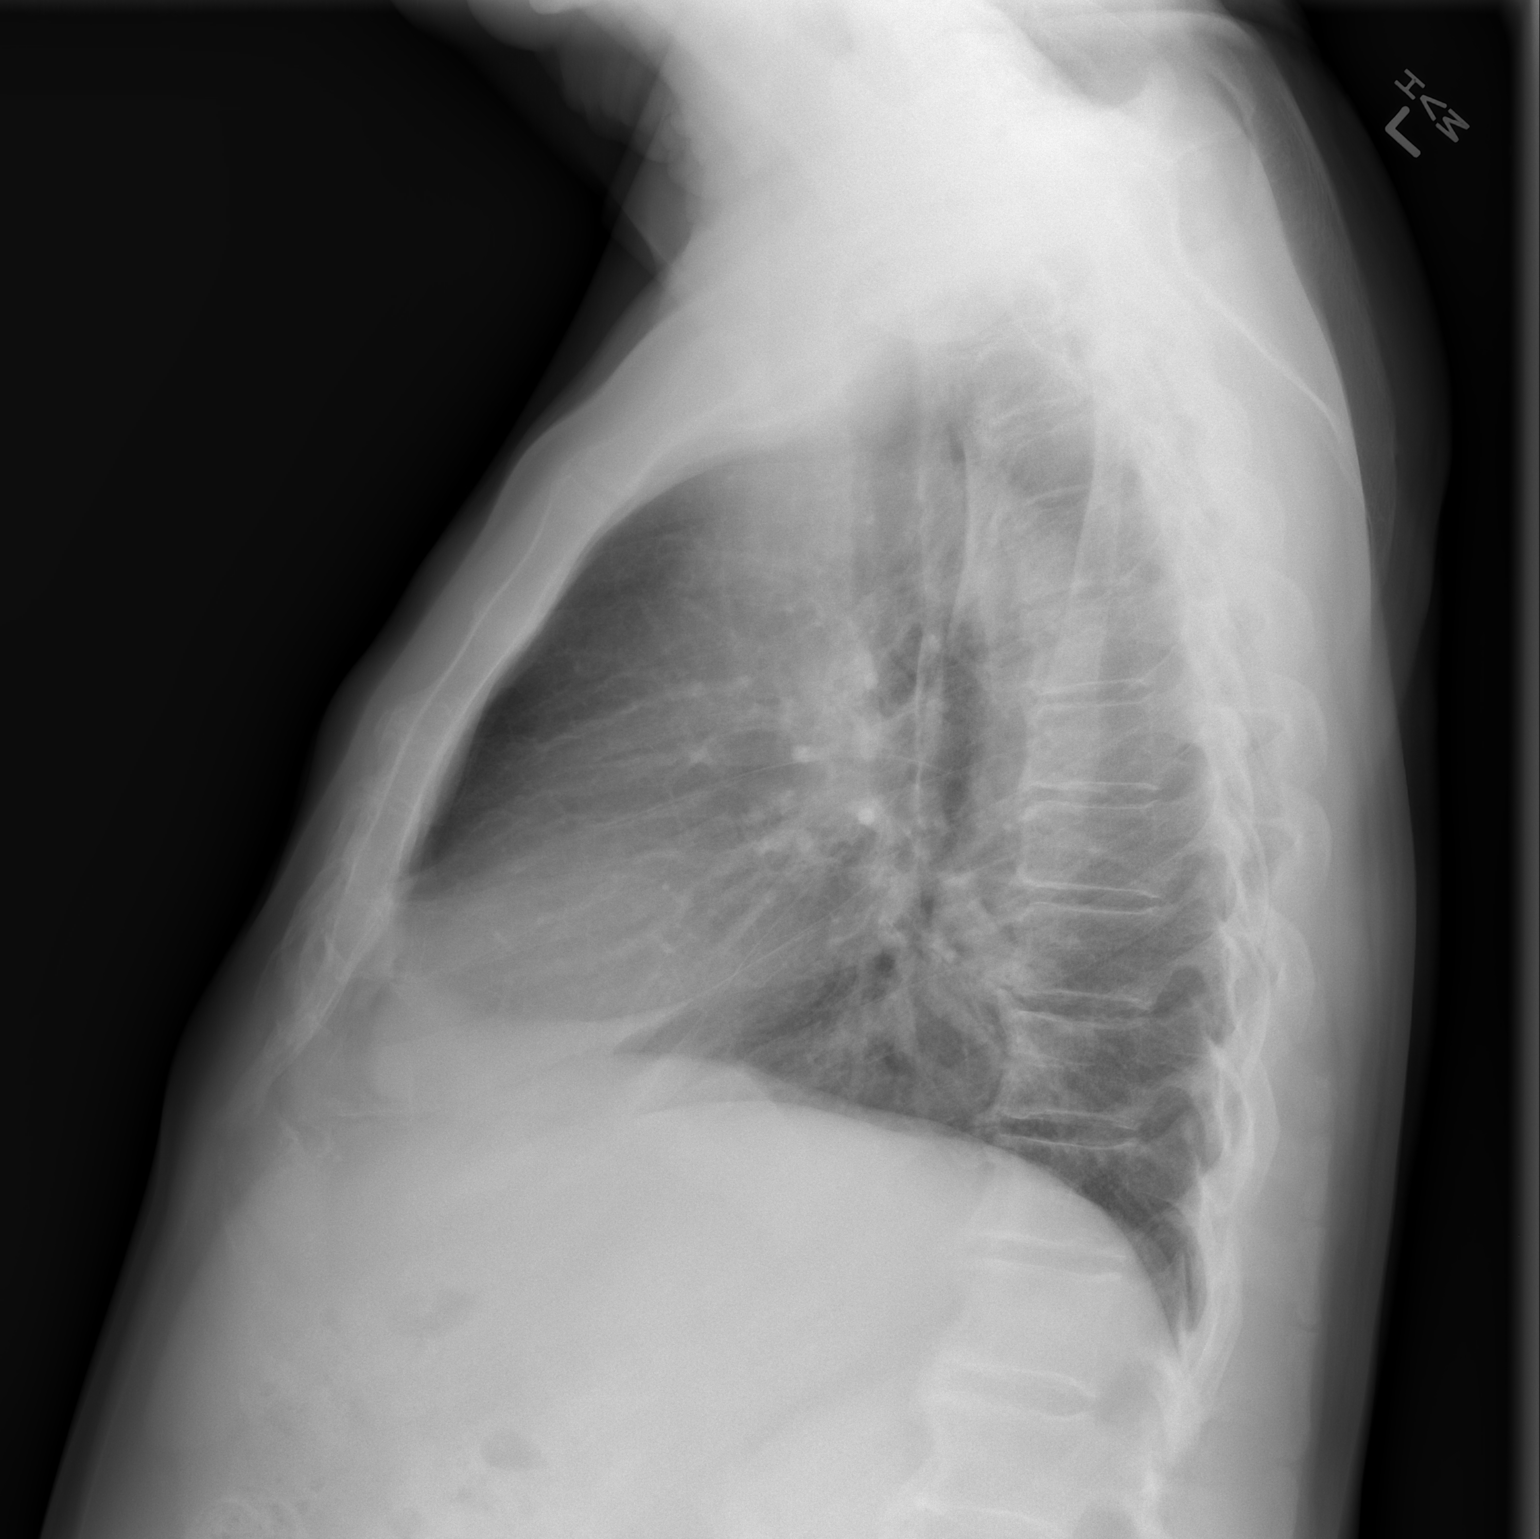

[2 of 2 positions shown; findings below may reference images not displayed]

FINDINGS: No active infiltrate or effusion is seen. Probable symmetrical
nipple shadows are noted at the lung bases. Mediastinal and hilar
contours are unremarkable. The heart is within normal limits in
size. There does appear to be a small hiatal hernia present. No
acute bony abnormality is seen.
IMPRESSION: No active cardiopulmonary disease.  Small hiatal hernia.

## 2015-11-27 DIAGNOSIS — M542 Cervicalgia: Secondary | ICD-10-CM | POA: Diagnosis not present

## 2015-12-11 DIAGNOSIS — D509 Iron deficiency anemia, unspecified: Secondary | ICD-10-CM | POA: Diagnosis not present

## 2015-12-11 DIAGNOSIS — E782 Mixed hyperlipidemia: Secondary | ICD-10-CM | POA: Diagnosis not present

## 2015-12-11 DIAGNOSIS — E119 Type 2 diabetes mellitus without complications: Secondary | ICD-10-CM | POA: Diagnosis not present

## 2015-12-11 DIAGNOSIS — R7301 Impaired fasting glucose: Secondary | ICD-10-CM | POA: Diagnosis not present

## 2015-12-15 DIAGNOSIS — I10 Essential (primary) hypertension: Secondary | ICD-10-CM | POA: Diagnosis not present

## 2015-12-15 DIAGNOSIS — R944 Abnormal results of kidney function studies: Secondary | ICD-10-CM | POA: Diagnosis not present

## 2015-12-15 DIAGNOSIS — C61 Malignant neoplasm of prostate: Secondary | ICD-10-CM | POA: Diagnosis not present

## 2015-12-15 DIAGNOSIS — E782 Mixed hyperlipidemia: Secondary | ICD-10-CM | POA: Diagnosis not present

## 2015-12-15 DIAGNOSIS — E119 Type 2 diabetes mellitus without complications: Secondary | ICD-10-CM | POA: Diagnosis not present

## 2015-12-18 ENCOUNTER — Other Ambulatory Visit (HOSPITAL_COMMUNITY): Payer: Self-pay | Admitting: Internal Medicine

## 2015-12-18 DIAGNOSIS — M542 Cervicalgia: Secondary | ICD-10-CM

## 2015-12-22 DIAGNOSIS — Z0000 Encounter for general adult medical examination without abnormal findings: Secondary | ICD-10-CM | POA: Diagnosis not present

## 2015-12-22 DIAGNOSIS — R3911 Hesitancy of micturition: Secondary | ICD-10-CM | POA: Diagnosis not present

## 2015-12-22 DIAGNOSIS — C61 Malignant neoplasm of prostate: Secondary | ICD-10-CM | POA: Diagnosis not present

## 2015-12-25 ENCOUNTER — Ambulatory Visit (HOSPITAL_COMMUNITY)
Admission: RE | Admit: 2015-12-25 | Discharge: 2015-12-25 | Disposition: A | Discharge status: Home / Self Care | Payer: Medicare Other | Source: Ambulatory Visit | Attending: Internal Medicine | Admitting: Internal Medicine

## 2015-12-25 DIAGNOSIS — M4802 Spinal stenosis, cervical region: Secondary | ICD-10-CM | POA: Insufficient documentation

## 2015-12-25 DIAGNOSIS — M50322 Other cervical disc degeneration at C5-C6 level: Secondary | ICD-10-CM | POA: Diagnosis not present

## 2015-12-25 DIAGNOSIS — M47812 Spondylosis without myelopathy or radiculopathy, cervical region: Secondary | ICD-10-CM | POA: Diagnosis not present

## 2015-12-25 DIAGNOSIS — M542 Cervicalgia: Secondary | ICD-10-CM | POA: Diagnosis not present

## 2015-12-25 DIAGNOSIS — M4312 Spondylolisthesis, cervical region: Secondary | ICD-10-CM | POA: Diagnosis not present

## 2016-03-14 DIAGNOSIS — D509 Iron deficiency anemia, unspecified: Secondary | ICD-10-CM | POA: Diagnosis not present

## 2016-03-14 DIAGNOSIS — E782 Mixed hyperlipidemia: Secondary | ICD-10-CM | POA: Diagnosis not present

## 2016-03-14 DIAGNOSIS — R7301 Impaired fasting glucose: Secondary | ICD-10-CM | POA: Diagnosis not present

## 2016-03-15 DIAGNOSIS — Z0001 Encounter for general adult medical examination with abnormal findings: Secondary | ICD-10-CM | POA: Diagnosis not present

## 2016-03-15 DIAGNOSIS — C61 Malignant neoplasm of prostate: Secondary | ICD-10-CM | POA: Diagnosis not present

## 2016-03-15 DIAGNOSIS — I10 Essential (primary) hypertension: Secondary | ICD-10-CM | POA: Diagnosis not present

## 2016-03-15 DIAGNOSIS — K219 Gastro-esophageal reflux disease without esophagitis: Secondary | ICD-10-CM | POA: Diagnosis not present

## 2016-03-15 DIAGNOSIS — E782 Mixed hyperlipidemia: Secondary | ICD-10-CM | POA: Diagnosis not present

## 2016-03-15 DIAGNOSIS — D509 Iron deficiency anemia, unspecified: Secondary | ICD-10-CM | POA: Diagnosis not present

## 2016-04-04 DIAGNOSIS — C61 Malignant neoplasm of prostate: Secondary | ICD-10-CM | POA: Diagnosis not present

## 2016-04-05 DIAGNOSIS — S143XXA Injury of brachial plexus, initial encounter: Secondary | ICD-10-CM | POA: Diagnosis not present

## 2016-04-10 DIAGNOSIS — C61 Malignant neoplasm of prostate: Secondary | ICD-10-CM | POA: Diagnosis not present

## 2016-04-10 DIAGNOSIS — R3911 Hesitancy of micturition: Secondary | ICD-10-CM | POA: Diagnosis not present

## 2016-07-11 DIAGNOSIS — S143XXA Injury of brachial plexus, initial encounter: Secondary | ICD-10-CM | POA: Diagnosis not present

## 2016-07-22 DIAGNOSIS — I10 Essential (primary) hypertension: Secondary | ICD-10-CM | POA: Diagnosis not present

## 2016-07-22 DIAGNOSIS — E782 Mixed hyperlipidemia: Secondary | ICD-10-CM | POA: Diagnosis not present

## 2016-07-22 DIAGNOSIS — R7301 Impaired fasting glucose: Secondary | ICD-10-CM | POA: Diagnosis not present

## 2016-07-23 DIAGNOSIS — R944 Abnormal results of kidney function studies: Secondary | ICD-10-CM | POA: Diagnosis not present

## 2016-07-23 DIAGNOSIS — I10 Essential (primary) hypertension: Secondary | ICD-10-CM | POA: Diagnosis not present

## 2016-07-23 DIAGNOSIS — C61 Malignant neoplasm of prostate: Secondary | ICD-10-CM | POA: Diagnosis not present

## 2016-07-23 DIAGNOSIS — E119 Type 2 diabetes mellitus without complications: Secondary | ICD-10-CM | POA: Diagnosis not present

## 2016-10-22 DIAGNOSIS — S143XXA Injury of brachial plexus, initial encounter: Secondary | ICD-10-CM | POA: Diagnosis not present

## 2016-11-27 DIAGNOSIS — C61 Malignant neoplasm of prostate: Secondary | ICD-10-CM | POA: Diagnosis not present

## 2016-12-06 DIAGNOSIS — C61 Malignant neoplasm of prostate: Secondary | ICD-10-CM | POA: Diagnosis not present

## 2017-01-09 DIAGNOSIS — E782 Mixed hyperlipidemia: Secondary | ICD-10-CM | POA: Diagnosis not present

## 2017-01-09 DIAGNOSIS — R7301 Impaired fasting glucose: Secondary | ICD-10-CM | POA: Diagnosis not present

## 2017-01-09 DIAGNOSIS — D509 Iron deficiency anemia, unspecified: Secondary | ICD-10-CM | POA: Diagnosis not present

## 2017-01-13 DIAGNOSIS — E1165 Type 2 diabetes mellitus with hyperglycemia: Secondary | ICD-10-CM | POA: Diagnosis not present

## 2017-01-13 DIAGNOSIS — D72829 Elevated white blood cell count, unspecified: Secondary | ICD-10-CM | POA: Diagnosis not present

## 2017-01-13 DIAGNOSIS — I10 Essential (primary) hypertension: Secondary | ICD-10-CM | POA: Diagnosis not present

## 2017-01-13 DIAGNOSIS — E782 Mixed hyperlipidemia: Secondary | ICD-10-CM | POA: Diagnosis not present

## 2017-01-13 DIAGNOSIS — C61 Malignant neoplasm of prostate: Secondary | ICD-10-CM | POA: Diagnosis not present

## 2017-01-28 ENCOUNTER — Encounter: Payer: Self-pay | Admitting: General Surgery

## 2017-01-28 ENCOUNTER — Ambulatory Visit (INDEPENDENT_AMBULATORY_CARE_PROVIDER_SITE_OTHER): Payer: Medicare Other | Admitting: General Surgery

## 2017-01-28 VITALS — BP 150/78 | HR 68 | Temp 97.8°F | Resp 18 | Ht 69.0 in | Wt 178.0 lb

## 2017-01-28 DIAGNOSIS — K429 Umbilical hernia without obstruction or gangrene: Secondary | ICD-10-CM

## 2017-01-28 NOTE — Progress Notes (Signed)
Alejandro Krueger; [id number]; 21-Sep-1940   HPI Patient is a 76 year old white male who was referred to my care by Dr. [name] for evaluation and treatment of an umbilical hernia. Is been present for many years, but seems to be increasing in size and causing him discomfort. He denies any nausea or vomiting. It is made worse with coughing or lifting something heavy. It never fully reduces. He currently has no pain. Past Medical History:  Diagnosis Date  . GERD (gastroesophageal reflux disease)   . Hx of adenomatous colonic polyps   . Hypertension   . Prostate cancer ([place])   . Umbilical hernia with obstruction     Past Surgical History:  Procedure Laterality Date  . COLONOSCOPY  06/23/2007   [id number] polyps removed (cecum and hepatic flexure). The remainder of the colonic mucosa appeared normal/Left-sided diverticula/Normal rectum (tubular adenomas)  . COLONOSCOPY N/A 01/05/2014   Procedure: COLONOSCOPY;  Surgeon: Daneil Dolin, MD;  [facility];  Service: Endoscopy;  Laterality: N/A;  9:30  . ESOPHAGOGASTRODUODENOSCOPY N/A 01/05/2014   Procedure: ESOPHAGOGASTRODUODENOSCOPY (EGD);  Surgeon: Daneil Dolin, MD;  Location: [facility];  Service: Endoscopy;  Laterality: N/A;  . KNEE ARTHROSCOPY  2003   right  . prostate biopsy  2016  . RADIOACTIVE SEED IMPLANT N/A 06/02/2015   Procedure: RADIOACTIVE SEED IMPLANT/BRACHYTHERAPY IMPLANT;  Surgeon: Festus Aloe, MD;  Location: [facility];  Service: Urology;  Laterality: N/A;  90 seeds implanted no seeds in bladder    Family History  Problem Relation Age of Onset  . Colon cancer Neg Hx   . Diabetes Mother   . Heart attack Father     Current Outpatient Prescriptions on File Prior to Visit  Medication Sig Dispense Refill  . amLODipine (NORVASC) 5 MG tablet Take 5 mg by mouth daily.    Marland Kitchen buPROPion (WELLBUTRIN XL) 150 MG 24 hr tablet     . cephALEXin (KEFLEX) 500 MG capsule Take 1 capsule (500 mg total) by  mouth 3 (three) times daily. (Patient not taking: Reported on 06/23/2015) 12 capsule 0  . lisinopril-hydrochlorothiazide (PRINZIDE,ZESTORETIC) 20-12.5 MG per tablet Take 1 tablet by mouth daily.    Marland Kitchen omeprazole (PRILOSEC) 20 MG capsule Take 20 mg by mouth daily as needed (acid reflux).     Marland Kitchen oxyCODONE-acetaminophen (ROXICET) 5-325 MG tablet Take 1 tablet by mouth every 6 (six) hours as needed for severe pain. (Patient not taking: Reported on 06/23/2015) 30 tablet 0  . sildenafil (REVATIO) 20 MG tablet Take 20 mg by mouth 3 (three) times daily.    . tamsulosin (FLOMAX) 0.4 MG CAPS capsule Take 1 capsule (0.4 mg total) by mouth daily after supper. 30 capsule 5   No current facility-administered medications on file prior to visit.     No Known Allergies  History  Alcohol Use No    History  Smoking Status  . Never Smoker  Smokeless Tobacco  . Never Used    Review of Systems  Constitutional: Negative.   HENT: Negative.   Eyes: Negative.   Respiratory: Negative.   Cardiovascular: Negative.   Gastrointestinal: Negative.   Genitourinary: Positive for frequency.  Musculoskeletal: Negative.   Skin: Negative.   Neurological: Negative.   Endo/Heme/Allergies: Negative.   Psychiatric/Behavioral: Negative.     Objective   Vitals:   01/28/17 1104  BP: (!) 150/78  Pulse: 68  Resp: 18  Temp: 97.8 F (36.6 C)    Physical Exam  Constitutional: He is oriented to  person, place, and time and well-developed, well-nourished, and in no distress.  HENT:  Head: Normocephalic and atraumatic.  Neck: Normal range of motion. Neck supple.  Cardiovascular: Normal rate, regular rhythm and normal heart sounds.   No murmur heard. Pulmonary/Chest: Effort normal and breath sounds normal. He has no wheezes. He has no rales.  Abdominal: Soft. Bowel sounds are normal. He exhibits no distension. There is no tenderness.  Partially reducible umbilical hernia. Omentum seems to be present within the  hernia sac.  Neurological: He is alert and oriented to person, place, and time.  Skin: Skin is warm and dry.  Vitals reviewed.   Assessment  Umbilical hernia Plan   Patient is scheduled for umbilical herniorrhaphy with mesh on 02/12/2017. The risks and benefits of the procedure including bleeding, infection, mesh use, and the possibility of recurrence of the hernia were fully explained to the patient, who gave informed consent.

## 2017-01-28 NOTE — H&P (Signed)
Alejandro Krueger; [id number]; 10/13/1940   HPI Patient is a 76 year old white male who was referred to my care by Dr. [name] for evaluation and treatment of an umbilical hernia. Is been present for many years, but seems to be increasing in size and causing him discomfort. He denies any nausea or vomiting. It is made worse with coughing or lifting something heavy. It never fully reduces. He currently has no pain.     Past Medical History:  Diagnosis Date  . GERD (gastroesophageal reflux disease)   . Hx of adenomatous colonic polyps   . Hypertension   . Prostate cancer ([place])   . Umbilical hernia with obstruction          Past Surgical History:  Procedure Laterality Date  . COLONOSCOPY  06/23/2007   [id number] polyps removed (cecum and hepatic flexure). The remainder of the colonic mucosa appeared normal/Left-sided diverticula/Normal rectum (tubular adenomas)  . COLONOSCOPY N/A 01/05/2014   Procedure: COLONOSCOPY;  Surgeon: Daneil Dolin, MD;  [facility];  Service: Endoscopy;  Laterality: N/A;  9:30  . ESOPHAGOGASTRODUODENOSCOPY N/A 01/05/2014   Procedure: ESOPHAGOGASTRODUODENOSCOPY (EGD);  Surgeon: Daneil Dolin, MD;  [facility];  Service: Endoscopy;  Laterality: N/A;  . KNEE ARTHROSCOPY  2003   right  . prostate biopsy  2016  . RADIOACTIVE SEED IMPLANT N/A 06/02/2015   Procedure: RADIOACTIVE SEED IMPLANT/BRACHYTHERAPY IMPLANT;  Surgeon: Festus Aloe, MD;  Location: [facility];  Service: Urology;  Laterality: N/A;  28 seeds implanted no seeds in bladder         Family History  Problem Relation Age of Onset  . Colon cancer Neg Hx   . Diabetes Mother   . Heart attack Father           Current Outpatient Prescriptions on File Prior to Visit  Medication Sig Dispense Refill  . amLODipine (NORVASC) 5 MG tablet Take 5 mg by mouth daily.    Marland Kitchen buPROPion (WELLBUTRIN XL) 150 MG 24 hr tablet     . cephALEXin  (KEFLEX) 500 MG capsule Take 1 capsule (500 mg total) by mouth 3 (three) times daily. (Patient not taking: Reported on 06/23/2015) 12 capsule 0  . lisinopril-hydrochlorothiazide (PRINZIDE,ZESTORETIC) 20-12.5 MG per tablet Take 1 tablet by mouth daily.    Marland Kitchen omeprazole (PRILOSEC) 20 MG capsule Take 20 mg by mouth daily as needed (acid reflux).     Marland Kitchen oxyCODONE-acetaminophen (ROXICET) 5-325 MG tablet Take 1 tablet by mouth every 6 (six) hours as needed for severe pain. (Patient not taking: Reported on 06/23/2015) 30 tablet 0  . sildenafil (REVATIO) 20 MG tablet Take 20 mg by mouth 3 (three) times daily.    . tamsulosin (FLOMAX) 0.4 MG CAPS capsule Take 1 capsule (0.4 mg total) by mouth daily after supper. 30 capsule 5   No current facility-administered medications on file prior to visit.     No Known Allergies     History  Alcohol Use No       History  Smoking Status  . Never Smoker  Smokeless Tobacco  . Never Used    Review of Systems  Constitutional: Negative.   HENT: Negative.   Eyes: Negative.   Respiratory: Negative.   Cardiovascular: Negative.   Gastrointestinal: Negative.   Genitourinary: Positive for frequency.  Musculoskeletal: Negative.   Skin: Negative.   Neurological: Negative.   Endo/Heme/Allergies: Negative.   Psychiatric/Behavioral: Negative.     Objective      Vitals:  01/28/17 1104  BP: (!) 150/78  Pulse: 68  Resp: 18  Temp: 97.8 F (36.6 C)    Physical Exam  Constitutional: He is oriented to person, place, and time and well-developed, well-nourished, and in no distress.  HENT:  Head: Normocephalic and atraumatic.  Neck: Normal range of motion. Neck supple.  Cardiovascular: Normal rate, regular rhythm and normal heart sounds.   No murmur heard. Pulmonary/Chest: Effort normal and breath sounds normal. He has no wheezes. He has no rales.  Abdominal: Soft. Bowel sounds are normal. He exhibits no distension. There is no  tenderness.  Partially reducible umbilical hernia. Omentum seems to be present within the hernia sac.  Neurological: He is alert and oriented to person, place, and time.  Skin: Skin is warm and dry.  Vitals reviewed.   Assessment  Umbilical hernia Plan   Patient is scheduled for umbilical herniorrhaphy with mesh on 02/12/2017. The risks and benefits of the procedure including bleeding, infection, mesh use, and the possibility of recurrence of the hernia were fully explained to the patient, who gave informed consent.

## 2017-01-28 NOTE — Patient Instructions (Signed)
Umbilical Hernia, Adult A hernia is a bulge of tissue that pushes through an opening between muscles. An umbilical hernia happens in the abdomen, near the belly button (umbilicus). The hernia may contain tissues from the small intestine, large intestine, or fatty tissue covering the intestines (omentum). Umbilical hernias in adults tend to get worse over time, and they require surgical treatment. There are several types of umbilical hernias. You may have:  A hernia located just above or below the umbilicus (indirect hernia). This is the most common type of umbilical hernia in adults.  A hernia that forms through an opening formed by the umbilicus (direct hernia).  A hernia that comes and goes (reducible hernia). A reducible hernia may be visible only when you strain, lift something heavy, or cough. This type of hernia can be pushed back into the abdomen (reduced).  A hernia that traps abdominal tissue inside the hernia (incarcerated hernia). This type of hernia cannot be reduced.  A hernia that cuts off blood flow to the tissues inside the hernia (strangulated hernia). The tissues can start to die if this happens. This type of hernia requires emergency treatment.  What are the causes? An umbilical hernia happens when tissue inside the abdomen presses on a weak area of the abdominal muscles. What increases the risk? You may have a greater risk of this condition if you:  Are obese.  Have had several pregnancies.  Have a buildup of fluid inside your abdomen (ascites).  Have had surgery that weakens the abdominal muscles.  What are the signs or symptoms? The main symptom of this condition is a painless bulge at or near the belly button. A reducible hernia may be visible only when you strain, lift something heavy, or cough. Other symptoms may include:  Dull pain.  A feeling of pressure.  Symptoms of a strangulated hernia may include:  Pain that gets increasingly worse.  Nausea and  vomiting.  Pain when pressing on the hernia.  Skin over the hernia becoming red or purple.  Constipation.  Blood in the stool.  How is this diagnosed? This condition may be diagnosed based on:  A physical exam. You may be asked to cough or strain while standing. These actions increase the pressure inside your abdomen and force the hernia through the opening in your muscles. Your health care provider may try to reduce the hernia by pressing on it.  Your symptoms and medical history.  How is this treated? Surgery is the only treatment for an umbilical hernia. Surgery for a strangulated hernia is done as soon as possible. If you have a small hernia that is not incarcerated, you may need to lose weight before having surgery. Follow these instructions at home:  Lose weight, if told by your health care provider.  Do not try to push the hernia back in.  Watch your hernia for any changes in color or size. Tell your health care provider if any changes occur.  You may need to avoid activities that increase pressure on your hernia.  Do not lift anything that is heavier than 10 lb (4.5 kg) until your health care provider says that this is safe.  Take over-the-counter and prescription medicines only as told by your health care provider.  Keep all follow-up visits as told by your health care provider. This is important. Contact a health care provider if:  Your hernia gets larger.  Your hernia becomes painful. Get help right away if:  You develop sudden, severe pain near the   area of your hernia.  You have pain as well as nausea or vomiting.  You have pain and the skin over your hernia changes color.  You develop a fever. This information is not intended to replace advice given to you by your health care provider. Make sure you discuss any questions you have with your health care provider. Document Released: 01/12/2016 Document Revised: 04/14/2016 Document Reviewed:  01/12/2016 [employer] Interactive Patient Education  2018 [employer]  

## 2017-02-03 NOTE — Patient Instructions (Signed)
Alejandro Krueger  02/03/2017     @PREFPERIOPPHARMACY @   Your procedure is scheduled on  02/12/2017 .  Report to [facility] at  615  A.M.  Call this number if you have problems the morning of surgery:  [phone]   Remember:  Do not eat food or drink liquids after midnight.  Take these medicines the morning of surgery with A SIP OF WATER  Norvasc, wellbutrin, lisinopril, prilosec, revatio, flomax.   Do not wear jewelry, make-up or nail polish.  Do not wear lotions, powders, or perfumes, or deoderant.  Do not shave 48 hours prior to surgery.  Men may shave face and neck.  Do not bring valuables to the hospital.  [facility] is not responsible for any belongings or valuables.  Contacts, dentures or bridgework may not be worn into surgery.  Leave your suitcase in the car.  After surgery it may be brought to your room.  For patients admitted to the hospital, discharge time will be determined by your treatment team.  Patients discharged the day of surgery will not be allowed to drive home.   Name and phone number of your driver:   family Special instructions:  None  Please read over the following fact sheets that you were given. Anesthesia Post-op Instructions and Care and Recovery After Surgery       Open Hernia Repair, Adult Open hernia repair is a surgical procedure to fix a hernia. A hernia occurs when an internal organ or tissue pushes out through a weak spot in the abdominal wall muscles. Hernias commonly occur in the groin and around the navel. Most hernias tend to get worse over time. Often, surgery is done to prevent the hernia from becoming bigger, uncomfortable, or an emergency. Emergency surgery may be needed if abdominal contents get stuck in the opening (incarcerated hernia) or the blood supply gets cut off (strangulated hernia). In an open repair, an incision is made in the abdomen to perform the surgery. Tell a health care provider about:  Any  allergies you have.  All medicines you are taking, including vitamins, herbs, eye drops, creams, and over-the-counter medicines.  Any problems you or family members have had with anesthetic medicines.  Any blood or bone disorders you have.  Any surgeries you have had.  Any medical conditions you have, including any recent cold or flu symptoms.  Whether you are pregnant or may be pregnant. What are the risks? Generally, this is a safe procedure. However, problems may occur, including:  Long-lasting (chronic) pain.  Bleeding.  Infection.  Damage to the testicle. This can cause shrinking or swelling.  Damage to the bladder, blood vessels, intestine, or nerves near the hernia.  Trouble passing urine.  Allergic reactions to medicines.  Return of the hernia.  What happens before the procedure? Staying hydrated Follow instructions from your health care provider about hydration, which may include:  Up to 2 hours before the procedure - you may continue to drink clear liquids, such as water, clear fruit juice, black coffee, and plain tea.  Eating and drinking restrictions Follow instructions from your health care provider about eating and drinking, which may include:  8 hours before the procedure - stop eating heavy meals or foods such as meat, fried foods, or fatty foods.  6 hours before the procedure - stop eating light meals or foods, such as toast or cereal.  6 hours before the procedure -  stop drinking milk or drinks that contain milk.  2 hours before the procedure - stop drinking clear liquids.  Medicines  Ask your health care provider about: ? Changing or stopping your regular medicines. This is especially important if you are taking diabetes medicines or blood thinners. ? Taking medicines such as aspirin and ibuprofen. These medicines can thin your blood. Do not take these medicines before your procedure if your health care provider instructs you not to.  You may  be given antibiotic medicine to help prevent infection. General instructions  You may have blood tests or imaging studies.  Ask your health care provider how your surgical site will be marked or identified.  If you smoke, do not smoke for at least 2 weeks before your procedure or for as long as told by your health care provider.  Let your health care provider know if you develop a cold or any infection before your surgery.  Plan to have someone take you home from the hospital or clinic.  If you will be going home right after the procedure, plan to have someone with you for 24 hours. What happens during the procedure?  To reduce your risk of infection: ? Your health care team will wash or sanitize their hands. ? Your skin will be washed with soap. ? Hair may be removed from the surgical area.  An IV tube will be inserted into one of your veins.  You will be given one or more of the following: ? A medicine to help you relax (sedative). ? A medicine to numb the area (local anesthetic). ? A medicine to make you fall asleep (general anesthetic).  Your surgeon will make an incision over the hernia.  The tissues of the hernia will be moved back into place.  The edges of the hernia may be stitched together.  The opening in the abdominal muscles will be closed with stitches (sutures). Or, your surgeon will place a mesh patch made of manmade (synthetic) material over the opening.  The incision will be closed.  A bandage (dressing) may be placed over the incision. The procedure may vary among health care providers and hospitals. What happens after the procedure?  Your blood pressure, heart rate, breathing rate, and blood oxygen level will be monitored until the medicines you were given have worn off.  You may be given medicine for pain.  Do not drive for 24 hours if you received a sedative. This information is not intended to replace advice given to you by your health care  provider. Make sure you discuss any questions you have with your health care provider. Document Released: 02/05/2001 Document Revised: 03/01/2016 Document Reviewed: 01/24/2016 Elsevier Interactive Patient Education  2018 [place], Adult, Care After These instructions give you information about caring for yourself after your procedure. Your doctor may also give you more specific instructions. If you have problems or questions, contact your doctor. Follow these instructions at home: Surgical cut (incision) care   Follow instructions from your doctor about how to take care of your surgical cut area. Make sure you: ? Wash your hands with soap and water before you change your bandage (dressing). If you cannot use soap and water, use hand sanitizer. ? Change your bandage as told by your doctor. ? Leave stitches (sutures), skin glue, or skin tape (adhesive) strips in place. They may need to stay in place for 2 weeks or longer. If tape strips get loose and curl up, you  may trim the loose edges. Do not remove tape strips completely unless your doctor says it is okay.  Check your surgical cut every day for signs of infection. Check for: ? More redness, swelling, or pain. ? More fluid or blood. ? Warmth. ? Pus or a bad smell. Activity  Do not drive or use heavy machinery while taking prescription pain medicine. Do not drive until your doctor says it is okay.  Until your doctor says it is okay: ? Do not lift anything that is heavier than 10 lb (4.5 kg). ? Do not play contact sports.  Return to your normal activities as told by your doctor. Ask your doctor what activities are safe. General instructions  To prevent or treat having a hard time pooping (constipation) while you are taking prescription pain medicine, your doctor may recommend that you: ? Drink enough fluid to keep your pee (urine) clear or pale yellow. ? Take over-the-counter or prescription medicines. ? Eat  foods that are high in fiber, such as fresh fruits and vegetables, whole grains, and beans. ? Limit foods that are high in fat and processed sugars, such as fried and sweet foods.  Take over-the-counter and prescription medicines only as told by your doctor.  Do not take baths, swim, or use a hot tub until your doctor says it is okay.  Keep all follow-up visits as told by your doctor. This is important. Contact a doctor if:  You develop a rash.  You have more redness, swelling, or pain around your surgical cut.  You have more fluid or blood coming from your surgical cut.  Your surgical cut feels warm to the touch.  You have pus or a bad smell coming from your surgical cut.  You have a fever or chills.  You have blood in your poop (stool).  You have not pooped in 2-3 days.  Medicine does not help your pain. Get help right away if:  You have chest pain or you are short of breath.  You feel light-headed.  You feel weak and dizzy (feel faint).  You have very bad pain.  You throw up (vomit) and your pain is worse. This information is not intended to replace advice given to you by your health care provider. Make sure you discuss any questions you have with your health care provider. Document Released: 09/02/2014 Document Revised: 03/01/2016 Document Reviewed: 01/24/2016 Elsevier Interactive Patient Education  2017 [employer] Anesthesia, Adult General anesthesia is the use of medicines to make a person "go to sleep" (be unconscious) for a medical procedure. General anesthesia is often recommended when a procedure:  Is long.  Requires you to be still or in an unusual position.  Is major and can cause you to lose blood.  Is impossible to do without general anesthesia.  The medicines used for general anesthesia are called general anesthetics. In addition to making you sleep, the medicines:  Prevent pain.  Control your blood pressure.  Relax your  muscles.  Tell a health care provider about:  Any allergies you have.  All medicines you are taking, including vitamins, herbs, eye drops, creams, and over-the-counter medicines.  Any problems you or family members have had with anesthetic medicines.  Types of anesthetics you have had in the past.  Any bleeding disorders you have.  Any surgeries you have had.  Any medical conditions you have.  Any history of heart or lung conditions, such as heart failure, sleep apnea, or chronic obstructive pulmonary disease (  COPD).  Whether you are pregnant or may be pregnant.  Whether you use tobacco, alcohol, marijuana, or street drugs.  Any history of Armed forces logistics/support/administrative officer.  Any history of depression or anxiety. What are the risks? Generally, this is a safe procedure. However, problems may occur, including:  Allergic reaction to anesthetics.  Lung and heart problems.  Inhaling food or liquids from your stomach into your lungs (aspiration).  Injury to nerves.  Waking up during your procedure and being unable to move (rare).  Extreme agitation or a state of mental confusion (delirium) when you wake up from the anesthetic.  Air in the bloodstream, which can lead to stroke.  These problems are more likely to develop if you are having a major surgery or if you have an advanced medical condition. You can prevent some of these complications by answering all of your health care provider's questions thoroughly and by following all pre-procedure instructions. General anesthesia can cause side effects, including:  Nausea or vomiting  A sore throat from the breathing tube.  Feeling cold or shivery.  Feeling tired, washed out, or achy.  Sleepiness or drowsiness.  Confusion or agitation.  What happens before the procedure? Staying hydrated Follow instructions from your health care provider about hydration, which may include:  Up to 2 hours before the procedure - you may continue to  drink clear liquids, such as water, clear fruit juice, black coffee, and plain tea.  Eating and drinking restrictions Follow instructions from your health care provider about eating and drinking, which may include:  8 hours before the procedure - stop eating heavy meals or foods such as meat, fried foods, or fatty foods.  6 hours before the procedure - stop eating light meals or foods, such as toast or cereal.  6 hours before the procedure - stop drinking milk or drinks that contain milk.  2 hours before the procedure - stop drinking clear liquids.  Medicines  Ask your health care provider about: ? Changing or stopping your regular medicines. This is especially important if you are taking diabetes medicines or blood thinners. ? Taking medicines such as aspirin and ibuprofen. These medicines can thin your blood. Do not take these medicines before your procedure if your health care provider instructs you not to. ? Taking new dietary supplements or medicines. Do not take these during the week before your procedure unless your health care provider approves them.  If you are told to take a medicine or to continue taking a medicine on the day of the procedure, take the medicine with sips of water. General instructions   Ask if you will be going home the same day, the following day, or after a longer hospital stay. ? Plan to have someone take you home. ? Plan to have someone stay with you for the first 24 hours after you leave the hospital or clinic.  For 3-6 weeks before the procedure, try not to use any tobacco products, such as cigarettes, chewing tobacco, and e-cigarettes.  You may brush your teeth on the morning of the procedure, but make sure to spit out the toothpaste. What happens during the procedure?  You will be given anesthetics through a mask and through an IV tube in one of your veins.  You may receive medicine to help you relax (sedative).  As soon as you are asleep, a  breathing tube may be used to help you breathe.  An anesthesia specialist will stay with you throughout the procedure. He or she  will help keep you comfortable and safe by continuing to give you medicines and adjusting the amount of medicine that you get. He or she will also watch your blood pressure, pulse, and oxygen levels to make sure that the anesthetics do not cause any problems.  If a breathing tube was used to help you breathe, it will be removed before you wake up. The procedure may vary among health care providers and hospitals. What happens after the procedure?  You will wake up, often slowly, after the procedure is complete, usually in a recovery area.  Your blood pressure, heart rate, breathing rate, and blood oxygen level will be monitored until the medicines you were given have worn off.  You may be given medicine to help you calm down if you feel anxious or agitated.  If you will be going home the same day, your health care provider may check to make sure you can stand, drink, and urinate.  Your health care providers will treat your pain and side effects before you go home.  Do not drive for 24 hours if you received a sedative.  You may: ? Feel nauseous and vomit. ? Have a sore throat. ? Have mental slowness. ? Feel cold or shivery. ? Feel sleepy. ? Feel tired. ? Feel sore or achy, even in parts of your body where you did not have surgery. This information is not intended to replace advice given to you by your health care provider. Make sure you discuss any questions you have with your health care provider. Document Released: 11/19/2007 Document Revised: 01/23/2016 Document Reviewed: 07/27/2015 Elsevier Interactive Patient Education  2018 [place] Anesthesia, Adult, Care After These instructions provide you with information about caring for yourself after your procedure. Your health care provider may also give you more specific instructions. Your  treatment has been planned according to current medical practices, but problems sometimes occur. Call your health care provider if you have any problems or questions after your procedure. What can I expect after the procedure? After the procedure, it is common to have:  Vomiting.  A sore throat.  Mental slowness.  It is common to feel:  Nauseous.  Cold or shivery.  Sleepy.  Tired.  Sore or achy, even in parts of your body where you did not have surgery.  Follow these instructions at home: For at least 24 hours after the procedure:  Do not: ? Participate in activities where you could fall or become injured. ? Drive. ? Use heavy machinery. ? Drink alcohol. ? Take sleeping pills or medicines that cause drowsiness. ? Make important decisions or sign legal documents. ? Take care of children on your own.  Rest. Eating and drinking  If you vomit, drink water, juice, or soup when you can drink without vomiting.  Drink enough fluid to keep your urine clear or pale yellow.  Make sure you have little or no nausea before eating solid foods.  Follow the diet recommended by your health care provider. General instructions  Have a responsible adult stay with you until you are awake and alert.  Return to your normal activities as told by your health care provider. Ask your health care provider what activities are safe for you.  Take over-the-counter and prescription medicines only as told by your health care provider.  If you smoke, do not smoke without supervision.  Keep all follow-up visits as told by your health care provider. This is important. Contact a health care provider if:  You  continue to have nausea or vomiting at home, and medicines are not helpful.  You cannot drink fluids or start eating again.  You cannot urinate after 8-12 hours.  You develop a skin rash.  You have fever.  You have increasing redness at the site of your procedure. Get help right  away if:  You have difficulty breathing.  You have chest pain.  You have unexpected bleeding.  You feel that you are having a life-threatening or urgent problem. This information is not intended to replace advice given to you by your health care provider. Make sure you discuss any questions you have with your health care provider. Document Released: 11/18/2000 Document Revised: 01/15/2016 Document Reviewed: 07/27/2015 Elsevier Interactive Patient Education  [employer].

## 2017-02-07 ENCOUNTER — Encounter (HOSPITAL_COMMUNITY)
Admission: RE | Admit: 2017-02-07 | Discharge: 2017-02-07 | Disposition: A | Discharge status: Home / Self Care | Payer: Medicare Other | Source: Ambulatory Visit | Attending: General Surgery | Admitting: General Surgery

## 2017-02-07 ENCOUNTER — Encounter (HOSPITAL_COMMUNITY): Payer: Self-pay

## 2017-02-07 ENCOUNTER — Other Ambulatory Visit: Payer: Self-pay

## 2017-02-07 DIAGNOSIS — Z79899 Other long term (current) drug therapy: Secondary | ICD-10-CM | POA: Insufficient documentation

## 2017-02-07 DIAGNOSIS — Z01810 Encounter for preprocedural cardiovascular examination: Secondary | ICD-10-CM | POA: Diagnosis not present

## 2017-02-07 DIAGNOSIS — R9431 Abnormal electrocardiogram [ECG] [EKG]: Secondary | ICD-10-CM | POA: Insufficient documentation

## 2017-02-07 DIAGNOSIS — Z833 Family history of diabetes mellitus: Secondary | ICD-10-CM | POA: Diagnosis not present

## 2017-02-07 DIAGNOSIS — I160 Hypertensive urgency: Secondary | ICD-10-CM | POA: Diagnosis not present

## 2017-02-07 DIAGNOSIS — K219 Gastro-esophageal reflux disease without esophagitis: Secondary | ICD-10-CM | POA: Insufficient documentation

## 2017-02-07 DIAGNOSIS — Z01818 Encounter for other preprocedural examination: Secondary | ICD-10-CM | POA: Insufficient documentation

## 2017-02-07 DIAGNOSIS — Z9889 Other specified postprocedural states: Secondary | ICD-10-CM | POA: Diagnosis not present

## 2017-02-07 DIAGNOSIS — Z8249 Family history of ischemic heart disease and other diseases of the circulatory system: Secondary | ICD-10-CM | POA: Diagnosis not present

## 2017-02-07 DIAGNOSIS — K429 Umbilical hernia without obstruction or gangrene: Secondary | ICD-10-CM | POA: Diagnosis not present

## 2017-02-07 DIAGNOSIS — Z8546 Personal history of malignant neoplasm of prostate: Secondary | ICD-10-CM | POA: Diagnosis not present

## 2017-02-07 LAB — CBC WITH DIFFERENTIAL/PLATELET
BASOS PCT: 0 %
Basophils Absolute: 0 10*3/uL (ref 0.0–0.1)
EOS ABS: 0.2 10*3/uL (ref 0.0–0.7)
Eosinophils Relative: 2 %
HEMATOCRIT: 36.3 % — AB (ref 39.0–52.0)
Hemoglobin: 11.6 g/dL — ABNORMAL LOW (ref 13.0–17.0)
LYMPHS ABS: 1.9 10*3/uL (ref 0.7–4.0)
Lymphocytes Relative: 23 %
MCH: 27.5 pg (ref 26.0–34.0)
MCHC: 32 g/dL (ref 30.0–36.0)
MCV: 86 fL (ref 78.0–100.0)
MONO ABS: 0.7 10*3/uL (ref 0.1–1.0)
MONOS PCT: 8 %
Neutro Abs: 5.5 10*3/uL (ref 1.7–7.7)
Neutrophils Relative %: 67 %
Platelets: 283 10*3/uL (ref 150–400)
RBC: 4.22 MIL/uL (ref 4.22–5.81)
RDW: 15.4 % (ref 11.5–15.5)
WBC: 8.2 10*3/uL (ref 4.0–10.5)

## 2017-02-07 LAB — BASIC METABOLIC PANEL
Anion gap: 9 (ref 5–15)
BUN: 24 mg/dL — AB (ref 6–20)
CALCIUM: 8.8 mg/dL — AB (ref 8.9–10.3)
CHLORIDE: 103 mmol/L (ref 101–111)
CO2: 25 mmol/L (ref 22–32)
CREATININE: 1.63 mg/dL — AB (ref 0.61–1.24)
GFR calc Af Amer: 46 mL/min — ABNORMAL LOW (ref >60)
GFR calc non Af Amer: 39 mL/min — ABNORMAL LOW (ref >60)
GLUCOSE: 123 mg/dL — AB (ref 65–99)
Potassium: 3.9 mmol/L (ref 3.5–5.1)
Sodium: 137 mmol/L (ref 135–145)

## 2017-02-12 ENCOUNTER — Ambulatory Visit (HOSPITAL_COMMUNITY)
Admission: RE | Admit: 2017-02-12 | Discharge: 2017-02-12 | Disposition: A | Discharge status: Home / Self Care | Payer: Medicare Other | Source: Ambulatory Visit | Attending: General Surgery | Admitting: General Surgery

## 2017-02-12 ENCOUNTER — Encounter (HOSPITAL_COMMUNITY): Payer: Self-pay | Admitting: Anesthesiology

## 2017-02-12 ENCOUNTER — Encounter (HOSPITAL_COMMUNITY)
Admission: RE | Disposition: A | Discharge status: Home / Self Care | Payer: Self-pay | Source: Ambulatory Visit | Attending: General Surgery

## 2017-02-12 ENCOUNTER — Ambulatory Visit (HOSPITAL_COMMUNITY): Payer: Medicare Other | Admitting: Anesthesiology

## 2017-02-12 DIAGNOSIS — Z8546 Personal history of malignant neoplasm of prostate: Secondary | ICD-10-CM | POA: Diagnosis not present

## 2017-02-12 DIAGNOSIS — K420 Umbilical hernia with obstruction, without gangrene: Secondary | ICD-10-CM

## 2017-02-12 DIAGNOSIS — K429 Umbilical hernia without obstruction or gangrene: Secondary | ICD-10-CM | POA: Diagnosis not present

## 2017-02-12 DIAGNOSIS — K219 Gastro-esophageal reflux disease without esophagitis: Secondary | ICD-10-CM | POA: Diagnosis not present

## 2017-02-12 DIAGNOSIS — Z79899 Other long term (current) drug therapy: Secondary | ICD-10-CM | POA: Insufficient documentation

## 2017-02-12 DIAGNOSIS — I10 Essential (primary) hypertension: Secondary | ICD-10-CM | POA: Diagnosis not present

## 2017-02-12 HISTORY — PX: UMBILICAL HERNIA REPAIR: SHX196

## 2017-02-12 SURGERY — REPAIR, HERNIA, UMBILICAL, ADULT
Anesthesia: General | Site: Abdomen

## 2017-02-12 MED ORDER — CEFAZOLIN SODIUM-DEXTROSE 2-4 GM/100ML-% IV SOLN
2.0000 g | INTRAVENOUS | Status: AC
  Administered 2017-02-12: 2 g via INTRAVENOUS
  Filled 2017-02-12: qty 100

## 2017-02-12 MED ORDER — ONDANSETRON HCL 4 MG/2ML IJ SOLN
4.0000 mg | Freq: Once | INTRAMUSCULAR | Status: AC
  Administered 2017-02-12: 4 mg via INTRAVENOUS

## 2017-02-12 MED ORDER — SUCCINYLCHOLINE CHLORIDE 20 MG/ML IJ SOLN
INTRAMUSCULAR | Status: AC
  Filled 2017-02-12: qty 1

## 2017-02-12 MED ORDER — EPHEDRINE SULFATE 50 MG/ML IJ SOLN
INTRAMUSCULAR | Status: AC
  Filled 2017-02-12: qty 1

## 2017-02-12 MED ORDER — MIDAZOLAM HCL 2 MG/2ML IJ SOLN
INTRAMUSCULAR | Status: AC
  Filled 2017-02-12: qty 2

## 2017-02-12 MED ORDER — LACTATED RINGERS IV SOLN
INTRAVENOUS | Status: DC
  Administered 2017-02-12: 07:00:00 via INTRAVENOUS

## 2017-02-12 MED ORDER — FENTANYL CITRATE (PF) 100 MCG/2ML IJ SOLN
INTRAMUSCULAR | Status: DC | PRN
  Administered 2017-02-12 (×2): 50 ug via INTRAVENOUS

## 2017-02-12 MED ORDER — SODIUM CHLORIDE 0.9 % IR SOLN
Status: DC | PRN
  Administered 2017-02-12: 1000 mL

## 2017-02-12 MED ORDER — BUPIVACAINE HCL (PF) 0.5 % IJ SOLN
INTRAMUSCULAR | Status: AC
  Filled 2017-02-12: qty 30

## 2017-02-12 MED ORDER — ROCURONIUM BROMIDE 50 MG/5ML IV SOLN
INTRAVENOUS | Status: AC
  Filled 2017-02-12: qty 1

## 2017-02-12 MED ORDER — GLYCOPYRROLATE 0.2 MG/ML IJ SOLN
INTRAMUSCULAR | Status: DC | PRN
  Administered 2017-02-12: .6 mg via INTRAVENOUS

## 2017-02-12 MED ORDER — POVIDONE-IODINE 10 % EX OINT
TOPICAL_OINTMENT | CUTANEOUS | Status: AC
  Filled 2017-02-12: qty 1

## 2017-02-12 MED ORDER — BUPIVACAINE HCL (PF) 0.5 % IJ SOLN
INTRAMUSCULAR | Status: DC | PRN
  Administered 2017-02-12: 10 mL

## 2017-02-12 MED ORDER — NEOSTIGMINE METHYLSULFATE 10 MG/10ML IV SOLN
INTRAVENOUS | Status: DC | PRN
  Administered 2017-02-12: 3 mg via INTRAVENOUS

## 2017-02-12 MED ORDER — PROPOFOL 10 MG/ML IV BOLUS
INTRAVENOUS | Status: AC
  Filled 2017-02-12: qty 40

## 2017-02-12 MED ORDER — SODIUM CHLORIDE 0.9 % IJ SOLN
INTRAMUSCULAR | Status: AC
Start: 2017-02-12 — End: 2017-02-12
  Filled 2017-02-12: qty 10

## 2017-02-12 MED ORDER — CHLORHEXIDINE GLUCONATE CLOTH 2 % EX PADS
6.0000 | MEDICATED_PAD | Freq: Once | CUTANEOUS | Status: DC
Start: 2017-02-12 — End: 2017-02-12

## 2017-02-12 MED ORDER — LIDOCAINE HCL (CARDIAC) 10 MG/ML IV SOLN
INTRAVENOUS | Status: DC | PRN
  Administered 2017-02-12: 50 mg via INTRAVENOUS

## 2017-02-12 MED ORDER — GLYCOPYRROLATE 0.2 MG/ML IJ SOLN
INTRAMUSCULAR | Status: AC
  Filled 2017-02-12: qty 1

## 2017-02-12 MED ORDER — FENTANYL CITRATE (PF) 250 MCG/5ML IJ SOLN
INTRAMUSCULAR | Status: AC
  Filled 2017-02-12: qty 5

## 2017-02-12 MED ORDER — GLYCOPYRROLATE 0.2 MG/ML IJ SOLN
0.2000 mg | Freq: Once | INTRAMUSCULAR | Status: AC
  Administered 2017-02-12: 0.2 mg via INTRAVENOUS

## 2017-02-12 MED ORDER — GLYCOPYRROLATE 0.2 MG/ML IJ SOLN
INTRAMUSCULAR | Status: AC
  Filled 2017-02-12: qty 3

## 2017-02-12 MED ORDER — KETOROLAC TROMETHAMINE 30 MG/ML IJ SOLN
INTRAMUSCULAR | Status: AC
  Filled 2017-02-12: qty 1

## 2017-02-12 MED ORDER — MIDAZOLAM HCL 2 MG/2ML IJ SOLN
1.0000 mg | INTRAMUSCULAR | Status: AC
  Administered 2017-02-12: 2 mg via INTRAVENOUS

## 2017-02-12 MED ORDER — ROCURONIUM 10MG/ML (10ML) SYRINGE FOR MEDFUSION PUMP - OPTIME
INTRAVENOUS | Status: DC | PRN
  Administered 2017-02-12: 25 mg via INTRAVENOUS

## 2017-02-12 MED ORDER — ONDANSETRON HCL 4 MG/2ML IJ SOLN
INTRAMUSCULAR | Status: AC
  Filled 2017-02-12: qty 2

## 2017-02-12 MED ORDER — HYDROCODONE-ACETAMINOPHEN 5-325 MG PO TABS: 1.0000 | ORAL_TABLET | Freq: Four times a day (QID) | ORAL | 0 refills | Status: DC | PRN

## 2017-02-12 MED ORDER — LIDOCAINE HCL (PF) 1 % IJ SOLN
INTRAMUSCULAR | Status: AC
  Filled 2017-02-12: qty 5

## 2017-02-12 MED ORDER — NEOSTIGMINE METHYLSULFATE 10 MG/10ML IV SOLN
INTRAVENOUS | Status: AC
  Filled 2017-02-12: qty 1

## 2017-02-12 MED ORDER — FENTANYL CITRATE (PF) 100 MCG/2ML IJ SOLN: 25.0000 ug | INTRAMUSCULAR | Status: DC | PRN

## 2017-02-12 MED ORDER — CEFAZOLIN SODIUM-DEXTROSE 2-4 GM/100ML-% IV SOLN
INTRAVENOUS | Status: AC
  Filled 2017-02-12: qty 100

## 2017-02-12 MED ORDER — PROPOFOL 10 MG/ML IV BOLUS
INTRAVENOUS | Status: DC | PRN
  Administered 2017-02-12: 160 mg via INTRAVENOUS

## 2017-02-12 MED ORDER — POVIDONE-IODINE 10 % OINT PACKET
TOPICAL_OINTMENT | CUTANEOUS | Status: DC | PRN
  Administered 2017-02-12: 1 via TOPICAL

## 2017-02-12 MED ORDER — KETOROLAC TROMETHAMINE 30 MG/ML IJ SOLN
30.0000 mg | Freq: Once | INTRAMUSCULAR | Status: AC
  Administered 2017-02-12: 30 mg via INTRAVENOUS

## 2017-02-12 MED ORDER — EPHEDRINE SULFATE 50 MG/ML IJ SOLN
INTRAMUSCULAR | Status: DC | PRN
Start: 2017-02-12 — End: 2017-02-12
  Administered 2017-02-12: 10 mg via INTRAVENOUS

## 2017-02-12 SURGICAL SUPPLY — 36 items
BAG HAMPER (MISCELLANEOUS) ×3 IMPLANT
BLADE SURG SZ11 CARB STEEL (BLADE) ×3 IMPLANT
CHLORAPREP W/TINT 26ML (MISCELLANEOUS) ×3 IMPLANT
CLOTH BEACON ORANGE TIMEOUT ST (SAFETY) ×3 IMPLANT
COVER LIGHT HANDLE STERIS (MISCELLANEOUS) ×6 IMPLANT
DECANTER SPIKE VIAL GLASS SM (MISCELLANEOUS) ×3 IMPLANT
ELECT REM PT RETURN 9FT ADLT (ELECTROSURGICAL) ×3
ELECTRODE REM PT RTRN 9FT ADLT (ELECTROSURGICAL) ×1 IMPLANT
GLOVE BIOGEL PI IND STRL 6.5 (GLOVE) ×1 IMPLANT
GLOVE BIOGEL PI IND STRL 7.0 (GLOVE) ×1 IMPLANT
GLOVE BIOGEL PI INDICATOR 6.5 (GLOVE) ×2
GLOVE BIOGEL PI INDICATOR 7.0 (GLOVE) ×2
GLOVE SURG SS PI 7.5 STRL IVOR (GLOVE) ×6 IMPLANT
GOWN STRL REUS W/ TWL LRG LVL3 (GOWN DISPOSABLE) ×1 IMPLANT
GOWN STRL REUS W/TWL LRG LVL3 (GOWN DISPOSABLE) ×8 IMPLANT
INST SET MINOR GENERAL (KITS) ×3 IMPLANT
KIT ROOM TURNOVER APOR (KITS) ×3 IMPLANT
MANIFOLD NEPTUNE II (INSTRUMENTS) ×3 IMPLANT
MESH VENTRALEX ST 2.5 CRC MED (Mesh General) ×3 IMPLANT
NEEDLE HYPO 25X1 1.5 SAFETY (NEEDLE) ×3 IMPLANT
NS IRRIG 1000ML POUR BTL (IV SOLUTION) ×3 IMPLANT
PACK MINOR (CUSTOM PROCEDURE TRAY) ×3 IMPLANT
PAD ARMBOARD 7.5X6 YLW CONV (MISCELLANEOUS) ×3 IMPLANT
SET BASIN LINEN APH (SET/KITS/TRAYS/PACK) ×3 IMPLANT
SPONGE GAUZE 2X2 12PLY UNSTER (GAUZE/BANDAGES/DRESSINGS) ×3 IMPLANT
SPONGE GAUZE 2X2 8PLY STER LF (GAUZE/BANDAGES/DRESSINGS) ×2
SPONGE GAUZE 2X2 8PLY STRL LF (GAUZE/BANDAGES/DRESSINGS) ×4 IMPLANT
STAPLER VISISTAT (STAPLE) ×3 IMPLANT
STAPLER VISISTAT 35W (STAPLE) ×3 IMPLANT
SUT ETHIBOND NAB MO 7 #0 18IN (SUTURE) ×3 IMPLANT
SUT VIC AB 2-0 CT2 27 (SUTURE) ×3 IMPLANT
SUT VIC AB 3-0 SH 27 (SUTURE) ×2
SUT VIC AB 3-0 SH 27X BRD (SUTURE) ×1 IMPLANT
SUT VICRYL AB 3 0 TIES (SUTURE) IMPLANT
SYR CONTROL 10ML LL (SYRINGE) ×3 IMPLANT
TAPE TRANSPORE STRL 2 31045 (GAUZE/BANDAGES/DRESSINGS) ×3 IMPLANT

## 2017-02-12 NOTE — Discharge Instructions (Signed)
Open Hernia Repair, Adult, Care After °This sheet gives you information about how to care for yourself after your procedure. Your health care provider may also give you more specific instructions. If you have problems or questions, contact your health care provider. °What can I expect after the procedure? °After the procedure, it is common to have: °· Mild discomfort. °· Slight bruising. °· Minor swelling. °· Pain in the abdomen. ° °Follow these instructions at home: °Incision care ° °· Follow instructions from your health care provider about how to take care of your incision area. Make sure you: °? Wash your hands with soap and water before you change your bandage (dressing). If soap and water are not available, use hand sanitizer. °? Change your dressing as told by your health care provider. °? Leave stitches (sutures), skin glue, or adhesive strips in place. These skin closures may need to stay in place for 2 weeks or longer. If adhesive strip edges start to loosen and curl up, you may trim the loose edges. Do not remove adhesive strips completely unless your health care provider tells you to do that. °· Check your incision area every day for signs of infection. Check for: °? More redness, swelling, or pain. °? More fluid or blood. °? Warmth. °? Pus or a bad smell. °Activity °· Do not drive or use heavy machinery while taking prescription pain medicine. Do not drive until your health care provider approves. °· Until your health care provider approves: °? Do not lift anything that is heavier than 10 lb (4.5 kg). °? Do not play contact sports. °· Return to your normal activities as told by your health care provider. Ask your health care provider what activities are safe. °General instructions °· To prevent or treat constipation while you are taking prescription pain medicine, your health care provider may recommend that you: °? Drink enough fluid to keep your urine clear or pale yellow. °? Take over-the-counter or  prescription medicines. °? Eat foods that are high in fiber, such as fresh fruits and vegetables, whole grains, and beans. °? Limit foods that are high in fat and processed sugars, such as fried and sweet foods. °· Take over-the-counter and prescription medicines only as told by your health care provider. °· Do not take tub baths or go swimming until your health care provider approves. °· Keep all follow-up visits as told by your health care provider. This is important. °Contact a health care provider if: °· You develop a rash. °· You have more redness, swelling, or pain around your incision. °· You have more fluid or blood coming from your incision. °· Your incision feels warm to the touch. °· You have pus or a bad smell coming from your incision. °· You have a fever or chills. °· You have blood in your stool (feces). °· You have not had a bowel movement in 2-3 days. °· Your pain is not controlled with medicine. °Get help right away if: °· You have chest pain or shortness of breath. °· You feel light-headed or feel faint. °· You have severe pain. °· You vomit and your pain is worse. °This information is not intended to replace advice given to you by your health care provider. Make sure you discuss any questions you have with your health care provider. °Document Released: 03/01/2005 Document Revised: 03/01/2016 Document Reviewed: 01/24/2016 °Elsevier Interactive Patient Education © 2017 Elsevier Inc. ° °

## 2017-02-12 NOTE — Transfer of Care (Signed)
Immediate Anesthesia Transfer of Care Note  Patient: Alejandro Krueger  Procedure(s) Performed: Procedure(s): HERNIA REPAIR UMBILICAL ADULT WITH MESH (N/A)  Patient Location: PACU  Anesthesia Type:General  Level of Consciousness: awake  Airway & Oxygen Therapy: Patient Spontanous Breathing  Post-op Assessment: Report given to RN  Post vital signs: Reviewed and stable  Last Vitals:  Vitals:   02/12/17 0655 02/12/17 0722  BP: 121/74 130/76  Pulse: 77 68  Temp: 36.9 C     Last Pain:  Vitals:   02/12/17 0655  TempSrc: Oral      Patients Stated Pain Goal: 5 (34/91/[id number])  Complications: No apparent anesthesia complications

## 2017-02-12 NOTE — Anesthesia Postprocedure Evaluation (Signed)
Anesthesia Post Note  Patient: Alejandro Krueger  Procedure(s) Performed: Procedure(s) (LRB): HERNIA REPAIR UMBILICAL ADULT WITH MESH (N/A)  Patient location during evaluation: PACU Anesthesia Type: General Level of consciousness: awake and alert and oriented Pain management: pain level controlled Vital Signs Assessment: post-procedure vital signs reviewed and stable Respiratory status: spontaneous breathing Cardiovascular status: blood pressure returned to baseline Postop Assessment: no signs of nausea or vomiting Anesthetic complications: no     Last Vitals:  Vitals:   02/12/17 0841 02/12/17 0845  BP: 113/73 110/68  Pulse: 80 80  Resp: 12 13  Temp: 36.6 C     Last Pain:  Vitals:   02/12/17 0655  TempSrc: Oral                 Jaydon Avina

## 2017-02-12 NOTE — Anesthesia Preprocedure Evaluation (Signed)
Anesthesia Evaluation  Patient identified by MRN, date of birth, ID band Patient awake    Reviewed: Allergy & Precautions, NPO status , Patient's Chart, lab work & pertinent test results  Airway Mallampati: II  TM Distance: >3 FB Neck ROM: Full    Dental  (+) Teeth Intact   Pulmonary neg pulmonary ROS,    breath sounds clear to auscultation       Cardiovascular hypertension, Pt. on medications  Rhythm:Regular Rate:Normal     Neuro/Psych negative neurological ROS  negative psych ROS   GI/Hepatic Neg liver ROS, GERD  Medicated,  Endo/Other  negative endocrine ROS  Renal/GU negative Renal ROS  negative genitourinary   Musculoskeletal negative musculoskeletal ROS (+)   Abdominal   Peds negative pediatric ROS (+)  Hematology negative hematology ROS (+)   Anesthesia Other Findings   Reproductive/Obstetrics negative OB ROS                             Anesthesia Physical Anesthesia Plan  ASA: III  Anesthesia Plan: General   Post-op Pain Management:    Induction: Intravenous  PONV Risk Score and Plan:   Airway Management Planned: Oral ETT  Additional Equipment:   Intra-op Plan:   Post-operative Plan: Extubation in OR  Informed Consent: I have reviewed the patients History and Physical, chart, labs and discussed the procedure including the risks, benefits and alternatives for the proposed anesthesia with the patient or authorized representative who has indicated his/her understanding and acceptance.     Plan Discussed with:   Anesthesia Plan Comments:         Anesthesia Quick Evaluation

## 2017-02-12 NOTE — Anesthesia Procedure Notes (Signed)
Procedure Name: Intubation Date/Time: 02/12/2017 7:42 AM Performed by: Tressie Stalker E Pre-anesthesia Checklist: Patient identified, Patient being monitored, Timeout performed, Emergency Drugs available and Suction available Patient Re-evaluated:Patient Re-evaluated prior to inductionOxygen Delivery Method: Circle system utilized Preoxygenation: Pre-oxygenation with 100% oxygen Intubation Type: IV induction Ventilation: Mask ventilation without difficulty Laryngoscope Size: Mac and 3 Grade View: Grade I Tube type: Oral Tube size: 7.0 mm Number of attempts: 1 Airway Equipment and Method: Stylet Placement Confirmation: ETT inserted through vocal cords under direct vision,  positive ETCO2 and breath sounds checked- equal and bilateral Secured at: 22 cm Tube secured with: Tape Dental Injury: Teeth and Oropharynx as per pre-operative assessment

## 2017-02-12 NOTE — Op Note (Signed)
Patient:  Alejandro Krueger  DOB:  06-Nov-1940  MRN:  [id number]   Preop Diagnosis:  Umbilical hernia  Postop Diagnosis:  Same  Procedure:  Umbilical herniorrhaphy with mesh  Surgeon:  Aviva Signs, M.D.  Anes:  Gen. endotracheal  Indications:  Patient is a 76 year old white male who presents with a symptomatic umbilical hernia. It can only be partially reduced. The risks and benefits of the procedure including bleeding, infection, mesh use, and the possibility of recurrence of the hernia were fully explained to the patient, who gave informed consent.  Procedure note:  The patient was placed in the supine position. After induction of general endotracheal anesthesia, the abdomen was prepped and draped using the usual sterile technique with DuraPrep. Surgical site confirmation was performed.  An infraumbilical incision was made down to the fascia. The umbilicus was freed away from the underlining fascia. The patient had a hernia sac with omentum that was attached to the wall. This had to be freed away using Bovie electrocautery and the omentum was reduced back into the abdominal cavity. A medium sized Bard Ventralax ST patch was then inserted and secured to the fascia using 0 Ethibond interrupted sutures. The overlying fascia was then reapproximated transversely using 0 Ethibond interrupted sutures. The base the umbilicus was secured back to the fascia using a 2-0 Vicryl interrupted suture. Subcutaneous layer was reapproximated using 3-0 Vicryl interrupted suture. 0.5% Sensorcaine was instilled into the surrounding wound. The skin was closed using staples. Betadine ointment and dry sterile dressings were applied.  All tape and needle counts were correct at the end of the procedure. The patient was extubated in the operating room and transferred to PACU in stable condition.  Complications:  None  EBL:  Minimal  Specimen:  None

## 2017-02-12 NOTE — Interval H&P Note (Signed)
History and Physical Interval Note:  02/12/2017 7:12 AM  Alejandro Krueger  has presented today for surgery, with the diagnosis of umbilical hernia  The various methods of treatment have been discussed with the patient and family. After consideration of risks, benefits and other options for treatment, the patient has consented to  Procedure(s): HERNIA REPAIR UMBILICAL ADULT WITH MESH (N/A) as a surgical intervention .  The patient's history has been reviewed, patient examined, no change in status, stable for surgery.  I have reviewed the patient's chart and labs.  Questions were answered to the patient's satisfaction.     Aviva Signs

## 2017-02-13 ENCOUNTER — Encounter (HOSPITAL_COMMUNITY): Payer: Self-pay | Admitting: General Surgery

## 2017-02-14 DIAGNOSIS — D72829 Elevated white blood cell count, unspecified: Secondary | ICD-10-CM | POA: Diagnosis not present

## 2017-02-14 DIAGNOSIS — D7289 Other specified disorders of white blood cells: Secondary | ICD-10-CM | POA: Diagnosis not present

## 2017-02-14 DIAGNOSIS — I10 Essential (primary) hypertension: Secondary | ICD-10-CM | POA: Diagnosis not present

## 2017-02-14 DIAGNOSIS — D509 Iron deficiency anemia, unspecified: Secondary | ICD-10-CM | POA: Diagnosis not present

## 2017-02-14 DIAGNOSIS — R944 Abnormal results of kidney function studies: Secondary | ICD-10-CM | POA: Diagnosis not present

## 2017-02-20 ENCOUNTER — Ambulatory Visit (INDEPENDENT_AMBULATORY_CARE_PROVIDER_SITE_OTHER): Payer: Self-pay | Admitting: General Surgery

## 2017-02-20 ENCOUNTER — Encounter: Payer: Self-pay | Admitting: General Surgery

## 2017-02-20 VITALS — BP 138/82 | HR 69 | Temp 98.2°F | Resp 18 | Ht 68.0 in | Wt 182.0 lb

## 2017-02-20 DIAGNOSIS — Z09 Encounter for follow-up examination after completed treatment for conditions other than malignant neoplasm: Secondary | ICD-10-CM

## 2017-02-20 NOTE — Progress Notes (Signed)
Subjective:     Alejandro Krueger  Status post umbilical herniorrhaphy with mesh. Has had some swelling at the umbilicus. It is not tender. No fever or chills have been noted. Objective:    BP 138/82   Pulse 69   Temp 98.2 F (36.8 C)   Resp 18   Ht 5\' 8"  (1.727 m)   Wt 182 lb (82.6 kg)   BMI 27.67 kg/m   General:  alert, cooperative and no distress  Abdomen is soft, incision healing well. Umbilicus is swollen. 20 mL seroma was aspirated. Staples removed, Steri-Strips applied     Assessment:    Doing well postoperatively. Mild postoperative seroma, no infection present.    Plan:   Patient was instructed that this could recur, thus I do want see him in 2 weeks for follow-up. He understands this and agrees.

## 2017-02-25 ENCOUNTER — Ambulatory Visit (INDEPENDENT_AMBULATORY_CARE_PROVIDER_SITE_OTHER): Payer: Self-pay | Admitting: General Surgery

## 2017-02-25 ENCOUNTER — Encounter: Payer: Self-pay | Admitting: General Surgery

## 2017-02-25 VITALS — BP 112/73 | HR 83 | Temp 98.7°F | Resp 18 | Ht 68.0 in | Wt 181.0 lb

## 2017-02-25 DIAGNOSIS — Z09 Encounter for follow-up examination after completed treatment for conditions other than malignant neoplasm: Secondary | ICD-10-CM

## 2017-02-26 NOTE — Progress Notes (Signed)
Subjective:     Alejandro Krueger  Status post umbilical herniorrhaphy with mesh with recurrent swelling underneath umbilicus. Objective:    BP 112/73   Pulse 83   Temp 98.7 F (37.1 C)   Resp 18   Ht 5\' 8"  (1.727 m)   Wt 181 lb (82.1 kg)   BMI 27.52 kg/m   General:  alert, cooperative and no distress  Incision healing well with 8 mL seroma aspirated. Pressure dressing applied.     Assessment:    Postoperative seroma, resolving    Plan:   I told patient to reapply pressure dressing as needed to prevent seroma formation. He understands this and agrees. Will follow-up next week.

## 2017-03-04 ENCOUNTER — Ambulatory Visit (INDEPENDENT_AMBULATORY_CARE_PROVIDER_SITE_OTHER): Payer: Self-pay | Admitting: General Surgery

## 2017-03-04 ENCOUNTER — Encounter: Payer: Self-pay | Admitting: General Surgery

## 2017-03-04 VITALS — BP 142/66 | HR 83 | Temp 99.1°F | Resp 18 | Ht 68.0 in | Wt 183.0 lb

## 2017-03-04 DIAGNOSIS — Z09 Encounter for follow-up examination after completed treatment for conditions other than malignant neoplasm: Secondary | ICD-10-CM

## 2017-03-04 NOTE — Progress Notes (Signed)
Subjective:     Atlee Abide  Here for follow-up wound check. Patient did have seroma at the umbilical hernia site. He states he does not think it has recurred. Objective:    BP (!) 142/66   Pulse 83   Temp 99.1 F (37.3 C)   Resp 18   Ht 5\' 8"  (1.727 m)   Wt 183 lb (83 kg)   BMI 27.83 kg/m   General:  alert, cooperative and no distress  Abdomen soft. Incision healing well. No recurrent seroma.     Assessment:    Doing well postoperatively. Seroma resolved    Plan:   Follow-up as needed.

## 2017-03-18 DIAGNOSIS — I10 Essential (primary) hypertension: Secondary | ICD-10-CM | POA: Diagnosis not present

## 2017-04-23 DIAGNOSIS — G540 Brachial plexus disorders: Secondary | ICD-10-CM | POA: Insufficient documentation

## 2017-07-22 DIAGNOSIS — M545 Low back pain: Secondary | ICD-10-CM | POA: Diagnosis not present

## 2017-09-09 DIAGNOSIS — D509 Iron deficiency anemia, unspecified: Secondary | ICD-10-CM | POA: Diagnosis not present

## 2017-09-09 DIAGNOSIS — E782 Mixed hyperlipidemia: Secondary | ICD-10-CM | POA: Diagnosis not present

## 2017-09-09 DIAGNOSIS — I10 Essential (primary) hypertension: Secondary | ICD-10-CM | POA: Diagnosis not present

## 2017-09-09 DIAGNOSIS — R7301 Impaired fasting glucose: Secondary | ICD-10-CM | POA: Diagnosis not present

## 2017-09-09 DIAGNOSIS — E1165 Type 2 diabetes mellitus with hyperglycemia: Secondary | ICD-10-CM | POA: Diagnosis not present

## 2017-09-11 DIAGNOSIS — E1165 Type 2 diabetes mellitus with hyperglycemia: Secondary | ICD-10-CM | POA: Diagnosis not present

## 2017-09-11 DIAGNOSIS — D509 Iron deficiency anemia, unspecified: Secondary | ICD-10-CM | POA: Diagnosis not present

## 2017-11-27 DIAGNOSIS — C61 Malignant neoplasm of prostate: Secondary | ICD-10-CM | POA: Diagnosis not present

## 2017-12-08 DIAGNOSIS — C61 Malignant neoplasm of prostate: Secondary | ICD-10-CM | POA: Diagnosis not present

## 2017-12-29 DIAGNOSIS — R7301 Impaired fasting glucose: Secondary | ICD-10-CM | POA: Diagnosis not present

## 2017-12-29 DIAGNOSIS — E782 Mixed hyperlipidemia: Secondary | ICD-10-CM | POA: Diagnosis not present

## 2017-12-30 DIAGNOSIS — D509 Iron deficiency anemia, unspecified: Secondary | ICD-10-CM | POA: Diagnosis not present

## 2017-12-30 DIAGNOSIS — C61 Malignant neoplasm of prostate: Secondary | ICD-10-CM | POA: Diagnosis not present

## 2017-12-30 DIAGNOSIS — E782 Mixed hyperlipidemia: Secondary | ICD-10-CM | POA: Diagnosis not present

## 2017-12-30 DIAGNOSIS — I10 Essential (primary) hypertension: Secondary | ICD-10-CM | POA: Diagnosis not present

## 2017-12-30 DIAGNOSIS — R944 Abnormal results of kidney function studies: Secondary | ICD-10-CM | POA: Diagnosis not present

## 2018-05-05 DIAGNOSIS — K219 Gastro-esophageal reflux disease without esophagitis: Secondary | ICD-10-CM | POA: Diagnosis not present

## 2018-05-05 DIAGNOSIS — I10 Essential (primary) hypertension: Secondary | ICD-10-CM | POA: Diagnosis not present

## 2018-05-05 DIAGNOSIS — E782 Mixed hyperlipidemia: Secondary | ICD-10-CM | POA: Diagnosis not present

## 2018-05-05 DIAGNOSIS — R7301 Impaired fasting glucose: Secondary | ICD-10-CM | POA: Diagnosis not present

## 2018-05-05 DIAGNOSIS — B0222 Postherpetic trigeminal neuralgia: Secondary | ICD-10-CM | POA: Diagnosis not present

## 2018-05-07 DIAGNOSIS — I10 Essential (primary) hypertension: Secondary | ICD-10-CM | POA: Diagnosis not present

## 2018-05-07 DIAGNOSIS — E782 Mixed hyperlipidemia: Secondary | ICD-10-CM | POA: Diagnosis not present

## 2018-05-07 DIAGNOSIS — Z0001 Encounter for general adult medical examination with abnormal findings: Secondary | ICD-10-CM | POA: Diagnosis not present

## 2018-05-07 DIAGNOSIS — Z23 Encounter for immunization: Secondary | ICD-10-CM | POA: Diagnosis not present

## 2018-05-07 DIAGNOSIS — E1165 Type 2 diabetes mellitus with hyperglycemia: Secondary | ICD-10-CM | POA: Diagnosis not present

## 2018-05-26 DIAGNOSIS — H35033 Hypertensive retinopathy, bilateral: Secondary | ICD-10-CM | POA: Diagnosis not present

## 2018-05-26 DIAGNOSIS — H524 Presbyopia: Secondary | ICD-10-CM | POA: Diagnosis not present

## 2018-05-26 DIAGNOSIS — H52223 Regular astigmatism, bilateral: Secondary | ICD-10-CM | POA: Diagnosis not present

## 2018-05-26 DIAGNOSIS — H5203 Hypermetropia, bilateral: Secondary | ICD-10-CM | POA: Diagnosis not present

## 2018-06-18 DIAGNOSIS — J060 Acute laryngopharyngitis: Secondary | ICD-10-CM | POA: Diagnosis not present

## 2018-09-10 DIAGNOSIS — D509 Iron deficiency anemia, unspecified: Secondary | ICD-10-CM | POA: Diagnosis not present

## 2018-09-10 DIAGNOSIS — E1165 Type 2 diabetes mellitus with hyperglycemia: Secondary | ICD-10-CM | POA: Diagnosis not present

## 2018-09-10 DIAGNOSIS — I10 Essential (primary) hypertension: Secondary | ICD-10-CM | POA: Diagnosis not present

## 2018-09-10 DIAGNOSIS — E782 Mixed hyperlipidemia: Secondary | ICD-10-CM | POA: Diagnosis not present

## 2018-09-14 DIAGNOSIS — K219 Gastro-esophageal reflux disease without esophagitis: Secondary | ICD-10-CM | POA: Diagnosis not present

## 2018-09-14 DIAGNOSIS — R944 Abnormal results of kidney function studies: Secondary | ICD-10-CM | POA: Diagnosis not present

## 2018-09-14 DIAGNOSIS — I10 Essential (primary) hypertension: Secondary | ICD-10-CM | POA: Diagnosis not present

## 2018-09-14 DIAGNOSIS — E782 Mixed hyperlipidemia: Secondary | ICD-10-CM | POA: Diagnosis not present

## 2018-09-14 DIAGNOSIS — D509 Iron deficiency anemia, unspecified: Secondary | ICD-10-CM | POA: Diagnosis not present

## 2019-01-05 ENCOUNTER — Encounter: Payer: Self-pay | Admitting: Internal Medicine

## 2019-02-08 DIAGNOSIS — Z0000 Encounter for general adult medical examination without abnormal findings: Secondary | ICD-10-CM | POA: Diagnosis not present

## 2019-02-18 DIAGNOSIS — R3911 Hesitancy of micturition: Secondary | ICD-10-CM | POA: Diagnosis not present

## 2019-03-23 DIAGNOSIS — E1165 Type 2 diabetes mellitus with hyperglycemia: Secondary | ICD-10-CM | POA: Diagnosis not present

## 2019-03-23 DIAGNOSIS — I10 Essential (primary) hypertension: Secondary | ICD-10-CM | POA: Diagnosis not present

## 2019-03-23 DIAGNOSIS — R7301 Impaired fasting glucose: Secondary | ICD-10-CM | POA: Diagnosis not present

## 2019-03-23 DIAGNOSIS — E782 Mixed hyperlipidemia: Secondary | ICD-10-CM | POA: Diagnosis not present

## 2019-03-23 DIAGNOSIS — D509 Iron deficiency anemia, unspecified: Secondary | ICD-10-CM | POA: Diagnosis not present

## 2019-04-13 DIAGNOSIS — E1122 Type 2 diabetes mellitus with diabetic chronic kidney disease: Secondary | ICD-10-CM | POA: Diagnosis not present

## 2019-04-13 DIAGNOSIS — R944 Abnormal results of kidney function studies: Secondary | ICD-10-CM | POA: Diagnosis not present

## 2019-04-13 DIAGNOSIS — E782 Mixed hyperlipidemia: Secondary | ICD-10-CM | POA: Diagnosis not present

## 2019-04-13 DIAGNOSIS — N183 Chronic kidney disease, stage 3 (moderate): Secondary | ICD-10-CM | POA: Diagnosis not present

## 2019-04-13 DIAGNOSIS — I10 Essential (primary) hypertension: Secondary | ICD-10-CM | POA: Diagnosis not present

## 2019-05-04 DIAGNOSIS — N183 Chronic kidney disease, stage 3 (moderate): Secondary | ICD-10-CM | POA: Diagnosis not present

## 2019-05-04 DIAGNOSIS — R944 Abnormal results of kidney function studies: Secondary | ICD-10-CM | POA: Diagnosis not present

## 2019-05-04 DIAGNOSIS — I10 Essential (primary) hypertension: Secondary | ICD-10-CM | POA: Diagnosis not present

## 2019-05-04 DIAGNOSIS — K219 Gastro-esophageal reflux disease without esophagitis: Secondary | ICD-10-CM | POA: Diagnosis not present

## 2019-05-04 DIAGNOSIS — B0222 Postherpetic trigeminal neuralgia: Secondary | ICD-10-CM | POA: Diagnosis not present

## 2019-05-04 DIAGNOSIS — E782 Mixed hyperlipidemia: Secondary | ICD-10-CM | POA: Diagnosis not present

## 2019-06-25 DIAGNOSIS — S0002XA Blister (nonthermal) of scalp, initial encounter: Secondary | ICD-10-CM | POA: Diagnosis not present

## 2019-06-25 DIAGNOSIS — L551 Sunburn of second degree: Secondary | ICD-10-CM | POA: Diagnosis not present

## 2019-07-08 DIAGNOSIS — Z23 Encounter for immunization: Secondary | ICD-10-CM | POA: Diagnosis not present

## 2019-08-03 DIAGNOSIS — R7301 Impaired fasting glucose: Secondary | ICD-10-CM | POA: Diagnosis not present

## 2019-08-03 DIAGNOSIS — I10 Essential (primary) hypertension: Secondary | ICD-10-CM | POA: Diagnosis not present

## 2019-08-03 DIAGNOSIS — E782 Mixed hyperlipidemia: Secondary | ICD-10-CM | POA: Diagnosis not present

## 2019-08-03 DIAGNOSIS — D509 Iron deficiency anemia, unspecified: Secondary | ICD-10-CM | POA: Diagnosis not present

## 2019-08-09 DIAGNOSIS — E782 Mixed hyperlipidemia: Secondary | ICD-10-CM | POA: Diagnosis not present

## 2019-08-09 DIAGNOSIS — R944 Abnormal results of kidney function studies: Secondary | ICD-10-CM | POA: Diagnosis not present

## 2019-08-09 DIAGNOSIS — N1831 Chronic kidney disease, stage 3a: Secondary | ICD-10-CM | POA: Diagnosis not present

## 2019-08-09 DIAGNOSIS — E1122 Type 2 diabetes mellitus with diabetic chronic kidney disease: Secondary | ICD-10-CM | POA: Diagnosis not present

## 2019-08-09 DIAGNOSIS — I10 Essential (primary) hypertension: Secondary | ICD-10-CM | POA: Diagnosis not present

## 2019-10-15 ENCOUNTER — Other Ambulatory Visit: Payer: Self-pay

## 2019-10-15 NOTE — Patient Outreach (Signed)
[facility]) Care Management  10/15/2019  Alejandro Krueger [birth year] [id number]   Medication Adherence call to Alejandro Krueger [facility] Ident[facility] Verify spoke with patient wife patient is due on Lisinopril 20 mg patient wife explain  He is taking 1 tablet daily she explain he has one more tablet and will place an order herself,patient said she can take care of there medications and could not understand why we call or want it to help,she does not want a call back. Alejandro Krueger is showing past due under [place].  [place] Management Direct Dial [phone]  Fax [phone] [email or web address]

## 2019-11-04 ENCOUNTER — Other Ambulatory Visit (HOSPITAL_COMMUNITY): Payer: Self-pay | Admitting: Internal Medicine

## 2019-11-04 ENCOUNTER — Ambulatory Visit (HOSPITAL_COMMUNITY)
Admission: RE | Admit: 2019-11-04 | Discharge: 2019-11-04 | Disposition: A | Discharge status: Home / Self Care | Payer: Medicare Other | Source: Ambulatory Visit | Attending: Internal Medicine | Admitting: Internal Medicine

## 2019-11-04 ENCOUNTER — Other Ambulatory Visit: Payer: Self-pay

## 2019-11-04 DIAGNOSIS — W19XXXA Unspecified fall, initial encounter: Secondary | ICD-10-CM

## 2019-11-04 DIAGNOSIS — S72001A Fracture of unspecified part of neck of right femur, initial encounter for closed fracture: Secondary | ICD-10-CM | POA: Insufficient documentation

## 2019-11-04 DIAGNOSIS — M25551 Pain in right hip: Secondary | ICD-10-CM | POA: Insufficient documentation

## 2019-11-04 DIAGNOSIS — R2689 Other abnormalities of gait and mobility: Secondary | ICD-10-CM | POA: Diagnosis not present

## 2019-11-08 ENCOUNTER — Other Ambulatory Visit (HOSPITAL_COMMUNITY): Payer: Medicare Other

## 2019-11-08 ENCOUNTER — Ambulatory Visit: Payer: Medicare Other | Admitting: Orthopedic Surgery

## 2019-11-08 ENCOUNTER — Ambulatory Visit: Payer: Medicare Other

## 2019-11-08 ENCOUNTER — Encounter (HOSPITAL_COMMUNITY)
Admission: RE | Admit: 2019-11-08 | Discharge: 2019-11-08 | Disposition: A | Discharge status: Home / Self Care | Payer: Medicare Other | Source: Ambulatory Visit | Attending: Orthopedic Surgery | Admitting: Orthopedic Surgery

## 2019-11-08 ENCOUNTER — Other Ambulatory Visit (HOSPITAL_COMMUNITY)
Admission: RE | Admit: 2019-11-08 | Discharge: 2019-11-08 | Disposition: A | Discharge status: Home / Self Care | Payer: Medicare Other | Source: Ambulatory Visit | Attending: Orthopedic Surgery | Admitting: Orthopedic Surgery

## 2019-11-08 ENCOUNTER — Other Ambulatory Visit: Payer: Self-pay

## 2019-11-08 ENCOUNTER — Encounter: Payer: Self-pay | Admitting: Orthopedic Surgery

## 2019-11-08 ENCOUNTER — Encounter (HOSPITAL_COMMUNITY): Payer: Self-pay

## 2019-11-08 ENCOUNTER — Telehealth: Payer: Self-pay | Admitting: Radiology

## 2019-11-08 VITALS — Temp 97.3°F | Ht 66.0 in | Wt 182.0 lb

## 2019-11-08 DIAGNOSIS — S72001A Fracture of unspecified part of neck of right femur, initial encounter for closed fracture: Secondary | ICD-10-CM | POA: Diagnosis not present

## 2019-11-08 DIAGNOSIS — Z01818 Encounter for other preprocedural examination: Secondary | ICD-10-CM | POA: Insufficient documentation

## 2019-11-08 DIAGNOSIS — Z8546 Personal history of malignant neoplasm of prostate: Secondary | ICD-10-CM | POA: Diagnosis not present

## 2019-11-08 DIAGNOSIS — Z791 Long term (current) use of non-steroidal anti-inflammatories (NSAID): Secondary | ICD-10-CM | POA: Diagnosis not present

## 2019-11-08 DIAGNOSIS — I10 Essential (primary) hypertension: Secondary | ICD-10-CM | POA: Insufficient documentation

## 2019-11-08 DIAGNOSIS — K219 Gastro-esophageal reflux disease without esophagitis: Secondary | ICD-10-CM | POA: Diagnosis not present

## 2019-11-08 DIAGNOSIS — Z86010 Personal history of colonic polyps: Secondary | ICD-10-CM | POA: Diagnosis not present

## 2019-11-08 DIAGNOSIS — W19XXXA Unspecified fall, initial encounter: Secondary | ICD-10-CM

## 2019-11-08 DIAGNOSIS — Y92512 Supermarket, store or market as the place of occurrence of the external cause: Secondary | ICD-10-CM

## 2019-11-08 DIAGNOSIS — Z79899 Other long term (current) drug therapy: Secondary | ICD-10-CM | POA: Diagnosis not present

## 2019-11-08 DIAGNOSIS — Z20822 Contact with and (suspected) exposure to covid-19: Secondary | ICD-10-CM | POA: Diagnosis not present

## 2019-11-08 DIAGNOSIS — X58XXXA Exposure to other specified factors, initial encounter: Secondary | ICD-10-CM | POA: Diagnosis not present

## 2019-11-08 LAB — CBC WITH DIFFERENTIAL/PLATELET
Abs Immature Granulocytes: 0.06 10*3/uL (ref 0.00–0.07)
Basophils Absolute: 0 10*3/uL (ref 0.0–0.1)
Basophils Relative: 0 %
Eosinophils Absolute: 0.2 10*3/uL (ref 0.0–0.5)
Eosinophils Relative: 2 %
HCT: 39.8 % (ref 39.0–52.0)
Hemoglobin: 12.4 g/dL — ABNORMAL LOW (ref 13.0–17.0)
Immature Granulocytes: 1 %
Lymphocytes Relative: 22 %
Lymphs Abs: 1.7 10*3/uL (ref 0.7–4.0)
MCH: 28.2 pg (ref 26.0–34.0)
MCHC: 31.2 g/dL (ref 30.0–36.0)
MCV: 90.7 fL (ref 80.0–100.0)
Monocytes Absolute: 0.6 10*3/uL (ref 0.1–1.0)
Monocytes Relative: 8 %
Neutro Abs: 5.3 10*3/uL (ref 1.7–7.7)
Neutrophils Relative %: 67 %
Platelets: 281 10*3/uL (ref 150–400)
RBC: 4.39 MIL/uL (ref 4.22–5.81)
RDW: 15.5 % (ref 11.5–15.5)
WBC: 7.9 10*3/uL (ref 4.0–10.5)
nRBC: 0 % (ref 0.0–0.2)

## 2019-11-08 LAB — BASIC METABOLIC PANEL
Anion gap: 7 (ref 5–15)
BUN: 23 mg/dL (ref 8–23)
CO2: 24 mmol/L (ref 22–32)
Calcium: 8.3 mg/dL — ABNORMAL LOW (ref 8.9–10.3)
Chloride: 106 mmol/L (ref 98–111)
Creatinine, Ser: 1.19 mg/dL (ref 0.61–1.24)
GFR calc Af Amer: >60 mL/min (ref >60)
GFR calc non Af Amer: 58 mL/min — ABNORMAL LOW (ref >60)
Glucose, Bld: 169 mg/dL — ABNORMAL HIGH (ref 70–99)
Potassium: 3.4 mmol/L — ABNORMAL LOW (ref 3.5–5.1)
Sodium: 137 mmol/L (ref 135–145)

## 2019-11-08 LAB — SARS CORONAVIRUS 2 (TAT 6-24 HRS): SARS Coronavirus 2: NEGATIVE

## 2019-11-08 NOTE — Patient Instructions (Signed)
Your procedure is scheduled on: 11/09/2019  Report to [facility] at   7:00  AM.  Call this number if you have problems the morning of surgery: [phone]   Remember:   Do not Eat or Drink after midnight         No Smoking the morning of surgery  :  Take these medicines the morning of surgery with A SIP OF WATER: Amlodipine, Wellbutrin, Omeprazole, gabapentin and hydrocodone if needed   Do not wear jewelry, make-up or nail polish.  Do not wear lotions, powders, or perfumes. You may wear deodorant.  Do not shave 48 hours prior to surgery. Men may shave face and neck.  Do not bring valuables to the hospital.  Contacts, dentures or bridgework may not be worn into surgery.  Leave suitcase in the car. After surgery it may be brought to your room.  For patients admitted to the hospital, checkout time is 11:00 AM the day of discharge.   Patients discharged the day of surgery will not be allowed to drive home.    Special Instructions: Shower using CHG night before surgery and shower the day of surgery use CHG.  Use special wash - you have one bottle of CHG for all showers.  You should use approximately 1/2 of the bottle for each shower.  How to Use Chlorhexidine for Bathing Chlorhexidine gluconate (CHG) is a germ-killing (antiseptic) solution that is used to clean the skin. It can get rid of the bacteria that normally live on the skin and can keep them away for about 24 hours. To clean your skin with CHG, you may be given:  A CHG solution to use in the shower or as part of a sponge bath.  A prepackaged cloth that contains CHG. Cleaning your skin with CHG may help lower the risk for infection:  While you are staying in the intensive care unit of the hospital.  If you have a vascular access, such as a central line, to provide short-term or long-term access to your veins.  If you have a catheter to drain urine from your bladder.  If you are on a ventilator. A ventilator is a machine  that helps you breathe by moving air in and out of your lungs.  After surgery. What are the risks? Risks of using CHG include:  A skin reaction.  Hearing loss, if CHG gets in your ears.  Eye injury, if CHG gets in your eyes and is not rinsed out.  The CHG product catching fire. Make sure that you avoid smoking and flames after applying CHG to your skin. Do not use CHG:  If you have a chlorhexidine allergy or have previously reacted to chlorhexidine.  On babies younger than 25 months of age. How to use CHG solution  Use CHG only as told by your health care provider, and follow the instructions on the label.  Use the full amount of CHG as directed. Usually, this is one bottle. During a shower Follow these steps when using CHG solution during a shower (unless your health care provider gives you different instructions): 1. Start the shower. 2. Use your normal soap and shampoo to wash your face and hair. 3. Turn off the shower or move out of the shower stream. 4. Pour the CHG onto a clean washcloth. Do not use any type of brush or rough-edged sponge. 5. Starting at your neck, lather your body down to your toes. Make sure you follow these instructions: ? If you  will be having surgery, pay special attention to the part of your body where you will be having surgery. Scrub this area for at least 1 minute. ? Do not use CHG on your head or face. If the solution gets into your ears or eyes, rinse them well with water. ? Avoid your genital area. ? Avoid any areas of skin that have broken skin, cuts, or scrapes. ? Scrub your back and under your arms. Make sure to wash skin folds. 6. Let the lather sit on your skin for 1-2 minutes or as long as told by your health care provider. 7. Thoroughly rinse your entire body in the shower. Make sure that all body creases and crevices are rinsed well. 8. Dry off with a clean towel. Do not put any substances on your body afterward-such as powder, lotion,  or perfume-unless you are told to do so by your health care provider. Only use lotions that are recommended by the manufacturer. 9. Put on clean clothes or pajamas. 10. If it is the night before your surgery, sleep in clean sheets.  During a sponge bath Follow these steps when using CHG solution during a sponge bath (unless your health care provider gives you different instructions): 1. Use your normal soap and shampoo to wash your face and hair. 2. Pour the CHG onto a clean washcloth. 3. Starting at your neck, lather your body down to your toes. Make sure you follow these instructions: ? If you will be having surgery, pay special attention to the part of your body where you will be having surgery. Scrub this area for at least 1 minute. ? Do not use CHG on your head or face. If the solution gets into your ears or eyes, rinse them well with water. ? Avoid your genital area. ? Avoid any areas of skin that have broken skin, cuts, or scrapes. ? Scrub your back and under your arms. Make sure to wash skin folds. 4. Let the lather sit on your skin for 1-2 minutes or as long as told by your health care provider. 5. Using a different clean, wet washcloth, thoroughly rinse your entire body. Make sure that all body creases and crevices are rinsed well. 6. Dry off with a clean towel. Do not put any substances on your body afterward-such as powder, lotion, or perfume-unless you are told to do so by your health care provider. Only use lotions that are recommended by the manufacturer. 7. Put on clean clothes or pajamas. 8. If it is the night before your surgery, sleep in clean sheets. How to use CHG prepackaged cloths  Only use CHG cloths as told by your health care provider, and follow the instructions on the label.  Use the CHG cloth on clean, dry skin.  Do not use the CHG cloth on your head or face unless your health care provider tells you to.  When washing with the CHG cloth: ? Avoid your genital  area. ? Avoid any areas of skin that have broken skin, cuts, or scrapes. Before surgery Follow these steps when using a CHG cloth to clean before surgery (unless your health care provider gives you different instructions): 1. Using the CHG cloth, vigorously scrub the part of your body where you will be having surgery. Scrub using a back-and-forth motion for 3 minutes. The area on your body should be completely wet with CHG when you are done scrubbing. 2. Do not rinse. Discard the cloth and let the area air-dry. Do not  put any substances on the area afterward, such as powder, lotion, or perfume. 3. Put on clean clothes or pajamas. 4. If it is the night before your surgery, sleep in clean sheets.  For general bathing Follow these steps when using CHG cloths for general bathing (unless your health care provider gives you different instructions). 1. Use a separate CHG cloth for each area of your body. Make sure you wash between any folds of skin and between your fingers and toes. Wash your body in the following order, switching to a new cloth after each step: ? The front of your neck, shoulders, and chest. ? Both of your arms, under your arms, and your hands. ? Your stomach and groin area, avoiding the genitals. ? Your right leg and foot. ? Your left leg and foot. ? The back of your neck, your back, and your buttocks. 2. Do not rinse. Discard the cloth and let the area air-dry. Do not put any substances on your body afterward-such as powder, lotion, or perfume-unless you are told to do so by your health care provider. Only use lotions that are recommended by the manufacturer. 3. Put on clean clothes or pajamas. Contact a health care provider if:  Your skin gets irritated after scrubbing.  You have questions about using your solution or cloth. Get help right away if:  Your eyes become very red or swollen.  Your eyes itch badly.  Your skin itches badly and is red or swollen.  Your hearing  changes.  You have trouble seeing.  You have swelling or tingling in your mouth or throat.  You have trouble breathing.  You swallow any chlorhexidine. Summary  Chlorhexidine gluconate (CHG) is a germ-killing (antiseptic) solution that is used to clean the skin. Cleaning your skin with CHG may help to lower your risk for infection.  You may be given CHG to use for bathing. It may be in a bottle or in a prepackaged cloth to use on your skin. Carefully follow your health care provider's instructions and the instructions on the product label.  Do not use CHG if you have a chlorhexidine allergy.  Contact your health care provider if your skin gets irritated after scrubbing. This information is not intended to replace advice given to you by your health care provider. Make sure you discuss any questions you have with your health care provider. Document Revised: 10/29/2018 Document Reviewed: 07/10/2017 Elsevier Patient Education  2020 [place].  Hip Fracture Treated With ORIF  A hip fracture is a break in the upper part of the thigh bone (femur). This is usually the result of an injury, often a fall. Open reduction with internal fixation (ORIF) is a common surgery used to fix a hip fracture or a broken hip. A surgical cut (incision) in the skin will be made to open the fracture area. This lets your health care provider see the broken bone. The bone pieces will then be put back together. Hardware, such as nails, pins, or compression screws will be used to hold the bone pieces in place. Tell a health care provider about:  Any allergies you have.  All medicines you are taking, including vitamins, herbs, eye drops, creams, and over-the-counter medicines.  Any problems you or family members have had with anesthetic medicines.  Any blood disorders you have.  Any surgeries you have had.  Any medical conditions you have.  Whether you are pregnant or may be pregnant. What are the risks?  Generally, this is a  safe procedure. However, problems may occur, including:  Infection.  Bleeding.  Allergic reactions to medicines.  Blood clot. This can form in the leg and travel to the lungs.  Damage to nerves or other structures.  Lung infection (pneumonia).  Continuing pain.  Difficulty walking.  Failure of bones to heal. What happens before the procedure? Ask your health care provider about:  Changing or stopping your regular medicines. This is especially important if you are taking diabetes medicines or blood thinners.  Taking medicines such as aspirin and ibuprofen. These medicines can thin your blood. Do not take these medicines unless your health care provider tells you to take them.  Taking over-the-counter medicines, vitamins, herbs, and supplements. General instructions  A thorough medical exam will be done to ensure that you are healthy enough for surgery. Tests include: ? A physical exam. ? A test of how your nerves and veins work (neurovascular assessment). ? Blood tests. ? Urine test. ? Electrocardiogram (ECG).  You may have imaging tests such as X-rays, CT scan, or MRI.  Plan to have someone take you home from the hospital or clinic.  Plan to have someone care for you for several days after you leave the hospital or clinic. This is important.  Ask your health care provider what steps will be taken to prevent infection. These may include: ? Removing hair at the surgery site. ? Washing skin with a germ-killing soap. ? Antibiotic medicine. What happens during the procedure?  An IV will be inserted into one of your veins.  You will be given one or more of the following: ? A medicine to help you relax (sedative). ? A medicine to numb the area (local anesthetic). ? A medicine that makes you fall asleep (general anesthetic). ? A medicine that is injected into your spine to numb the area below and slightly above the injection site (spinal  anesthetic). ? A medicine that is injected into an area of your body to numb everything below the injection site (regional anesthetic).  Small monitors will be attached to your body. They will be used to check your heart, blood pressure, and oxygen level.  You will receive an antibiotic through your IV during surgery.  A catheter may be inserted into your bladder to collect urine while you are asleep.  Your health care provider will: ? Make an incision over the hip. ? Move your bone pieces back into normal alignment and position. ? Secure the bone pieces with hardware (nails, pins, or compression screws).  X-rays may be taken to make sure the bone pieces are in the correct position.  The incision will be closed with small stitches (sutures) or staples.  A bandage (dressing) or some other kind of wound cover will be placed over the incision. The procedure may vary among health care providers and hospitals. What happens after the procedure?  Your blood pressure, heart rate, breathing rate, and blood oxygen level will be monitored until you leave the hospital or clinic.  You may continue to receive fluids through the IV.  You will receive additional antibiotics through your IV for the next 24 hours.  You will be given medicine for pain as needed.  You may receive a blood thinner to prevent blood clots.  You may have to wear compression stockings. These stockings help to prevent blood clots and reduce swelling in your legs.  You may be given instructions about how much body weight you can safely support on your injured leg (weight-bearing restrictions).  You may be given crutches, a cane, or a walker to help you move around so that you do not bear any weight on your injured leg.  You will be helped out of bed so you can begin moving around. This is important to improve blood flow and breathing. Summary  Open reduction with internal fixation (ORIF) is a common surgery used to fix a  hip fracture or a broken hip.  You will be given anesthetic medicines to make you fall asleep, and your health care provider will fix your fracture with nails, pins, or compression screws.  After the surgery, you may receive a blood thinner or wear compression stockings to prevent blood clots. This information is not intended to replace advice given to you by your health care provider. Make sure you discuss any questions you have with your health care provider. Document Revised: 09/22/2017 Document Reviewed: 09/22/2017 Elsevier Patient Education  2020 [place].  Hip Fracture Treated With ORIF, Care After This sheet gives you information about how to care for yourself after your procedure. Your health care provider may also give you more specific instructions. If you have problems or questions, contact your health care provider. What can I expect after the procedure? After the procedure, it is common to have:  Pain. You will be given medicines to treat this.  Swelling.  Difficulty walking.  Some redness or bruising around the incision.  A small amount of fluid or blood from the incision. Follow these instructions at home: Medicines  Take over-the-counter and prescription medicines only as told by your health care provider.  You may be given a blood thinner to take for up to six weeks. This will help reduce the risk of developing a blood clot. It is important to use this medicine exactly as directed.  You may be given calcium and vitamin D supplements to strengthen your bones.  If you are taking prescription pain medicine, take actions to prevent or treat constipation. Your health care provider may recommend that you: ? Drink enough fluid to keep your urine pale yellow. ? Eat foods that are high in fiber, such as fresh fruits and vegetables, whole grains, and beans. ? Limit foods that are high in fat and processed sugars, such as fried or sweet foods. ? Take an over-the-counter  or prescription medicine for constipation. Bathing  Do not take baths, swim, or use a hot tub until your health care provider approves. Ask your health care provider if you can take showers. You may only be allowed to take sponge baths.  Keep the bandage (dressing) dry until your health care provider says it can be removed. Incision care   Follow instructions from your health care provider about how to take care of your incision. Make sure you: ? Wash your hands with soap and water before you change your dressing. If soap and water are not available, use hand sanitizer. ? Change your dressing as told by your health care provider. ? Leave stitches (sutures), skin glue, or adhesive strips in place. These skin closures may need to stay in place for 2 weeks or longer. If adhesive strip edges start to loosen and curl up, you may trim the loose edges. Do not remove adhesive strips completely unless your health care provider tells you to do that.  Check your incision area every day for signs of infection. Check for: ? More redness, swelling, or pain. ? More fluid or blood. ? Warmth. ? Pus or a bad smell. Managing  pain, stiffness, and swelling   If directed, put ice on the affected area to prevent pain and swelling. ? Put ice in a plastic bag. ? Place a towel between your skin and the bag. ? Leave the ice on for 20 minutes, 2-3 times a day.  Move your toes often to avoid stiffness and to lessen swelling.  Raise (elevate) your leg above the level of your heart while you are sitting or lying down. To do this, try putting a few pillows under your leg. Activity   Return to your normal activities as told by your health care provider. Ask your health care provider what activities are safe for you.  Do exercises as told by your health care provider or physical therapist. This will help make your hip stronger and help you recover more quickly.  Do not use your injured limb to support (bear) your  body weight until your health care provider says that you can. Follow weight-bearing restrictions as told. Use crutches or other devices to help you move around (assistive devices) as directed.  You may feel most comfortable using a raised surface when sitting on the toilet or in a chair.  Consider using a toilet seat riser over the toilet for comfort. Driving  Do not drive or use heavy machinery while taking prescription pain medicine.  Ask your health care provider when it is safe for you to drive. General instructions  Wear compression stockings as told by your health care provider. These stockings help to prevent blood clots and reduce swelling in your legs.  Do not use any products that contain nicotine or tobacco, such as cigarettes and e-cigarettes. These can delay bone healing. If you need help quitting, ask your health care provider.  Keep all follow-up visits as told by your health care provider. This is important. This may include visits for: ? Physical therapy. ? Screening for osteoporosis. Osteoporosis is thinning and loss of density in your bones. Contact a health care provider if you:  Have a fever.  Have pain that is not helped with medicine.  Have more redness, swelling, or pain at your incision area.  Have more fluid or blood coming from your incision or leaking through your dressing.  Notice that your incision feels warm to the touch.  Have pus or a bad smell coming from your incision area. Get help right away if you:  Notice that the edges of your incision have come apart after the sutures or staples have been removed.  Have pain, warmth, or tenderness in the back of your lower leg (calf).  Have tingling or numbness in your leg.  Have a pale and cold leg.  Have trouble breathing.  Have chest pain. Summary  After the procedure, it is common to have some pain and swelling.  Take pain medicines as directed by your health care provider. Icing may also  help with pain control.  Contact your health care provider if you have signs of infection, severe pain, or more fluid or blood coming from your incision. This information is not intended to replace advice given to you by your health care provider. Make sure you discuss any questions you have with your health care provider. Document Revised: 05/02/2018 Document Reviewed: 09/22/2017 Elsevier Patient Education  2020 [place] Anesthesia, Adult, Care After This sheet gives you information about how to care for yourself after your procedure. Your health care provider may also give you more specific instructions. If you have problems or  questions, contact your health care provider. What can I expect after the procedure? After the procedure, the following side effects are common:  Pain or discomfort at the IV site.  Nausea.  Vomiting.  Sore throat.  Trouble concentrating.  Feeling cold or chills.  Weak or tired.  Sleepiness and fatigue.  Soreness and body aches. These side effects can affect parts of the body that were not involved in surgery. Follow these instructions at home:  For at least 24 hours after the procedure:  Have a responsible adult stay with you. It is important to have someone help care for you until you are awake and alert.  Rest as needed.  Do not: ? Participate in activities in which you could fall or become injured. ? Drive. ? Use heavy machinery. ? Drink alcohol. ? Take sleeping pills or medicines that cause drowsiness. ? Make important decisions or sign legal documents. ? Take care of children on your own. Eating and drinking  Follow any instructions from your health care provider about eating or drinking restrictions.  When you feel hungry, start by eating small amounts of foods that are soft and easy to digest (bland), such as toast. Gradually return to your regular diet.  Drink enough fluid to keep your urine pale yellow.  If you  vomit, rehydrate by drinking water, juice, or clear broth. General instructions  If you have sleep apnea, surgery and certain medicines can increase your risk for breathing problems. Follow instructions from your health care provider about wearing your sleep device: ? Anytime you are sleeping, including during daytime naps. ? While taking prescription pain medicines, sleeping medicines, or medicines that make you drowsy.  Return to your normal activities as told by your health care provider. Ask your health care provider what activities are safe for you.  Take over-the-counter and prescription medicines only as told by your health care provider.  If you smoke, do not smoke without supervision.  Keep all follow-up visits as told by your health care provider. This is important. Contact a health care provider if:  You have nausea or vomiting that does not get better with medicine.  You cannot eat or drink without vomiting.  You have pain that does not get better with medicine.  You are unable to pass urine.  You develop a skin rash.  You have a fever.  You have redness around your IV site that gets worse. Get help right away if:  You have difficulty breathing.  You have chest pain.  You have blood in your urine or stool, or you vomit blood. Summary  After the procedure, it is common to have a sore throat or nausea. It is also common to feel tired.  Have a responsible adult stay with you for the first 24 hours after general anesthesia. It is important to have someone help care for you until you are awake and alert.  When you feel hungry, start by eating small amounts of foods that are soft and easy to digest (bland), such as toast. Gradually return to your regular diet.  Drink enough fluid to keep your urine pale yellow.  Return to your normal activities as told by your health care provider. Ask your health care provider what activities are safe for you. This information is  not intended to replace advice given to you by your health care provider. Make sure you discuss any questions you have with your health care provider. Document Revised: 08/15/2017 Document Reviewed: 03/28/2017 Elsevier Patient Education  2020 [employer]  

## 2019-11-08 NOTE — Patient Outreach (Signed)
[facility]) Care Management  11/08/2019  Alejandro Krueger March 26, 1941 [id number]   Medication Adherence call to Alejandro Krueger HIPPA Compliant Voice message left with a call back number. Alejandro Krueger is showing past due on Lisinopril 20 mg under [facility].   [facility] Direct Dial [phone]  Fax [phone] [email or web address]

## 2019-11-08 NOTE — Progress Notes (Signed)
Chief Complaint  Patient presents with  . Hip Injury    Right hip fracture, DOI 11-02-19. Referred by Dr. Nevada Crane.   Past Medical History:  Diagnosis Date  . GERD (gastroesophageal reflux disease)   . Hx of adenomatous colonic polyps   . Hypertension   . Prostate cancer ([place])   . Umbilical hernia with obstruction     History 79 year old male with no major medical problems status post seeding for prostate disease presents with a 39-day-old hip fracture referred to [place] by Dr. Nevada Crane.  He fell at [facility] states on the ninth had x-rays on the 11th due to continued pain in his right hip and trouble walking  Presents now with mild pain which increases with weightbearing he is on crutches  Past Surgical History:  Procedure Laterality Date  . COLONOSCOPY  06/23/2007   [id number] polyps removed (cecum and hepatic flexure). The remainder of the colonic mucosa appeared normal/Left-sided diverticula/Normal rectum (tubular adenomas)  . COLONOSCOPY N/A 01/05/2014   Procedure: COLONOSCOPY;  Surgeon: Daneil Dolin, MD;  [facility];  Service: Endoscopy;  Laterality: N/A;  9:30  . ESOPHAGOGASTRODUODENOSCOPY N/A 01/05/2014   Procedure: ESOPHAGOGASTRODUODENOSCOPY (EGD);  Surgeon: Daneil Dolin, MD;  [facility]: [facility];  Service: Endoscopy;  Laterality: N/A;  . KNEE ARTHROSCOPY  2003   right  . prostate biopsy  2016  . RADIOACTIVE SEED IMPLANT N/A 06/02/2015   Procedure: RADIOACTIVE SEED IMPLANT/BRACHYTHERAPY IMPLANT;  Surgeon: Festus Aloe, MD;  Location: [facility];  Service: Urology;  Laterality: N/A;  63 seeds implanted no seeds in bladder  . UMBILICAL HERNIA REPAIR N/A 02/12/2017   Procedure: HERNIA REPAIR UMBILICAL ADULT WITH MESH;  Surgeon: Aviva Signs, MD;  Location: [facility];  Service: General;  Laterality: N/A;    Social History   Tobacco Use  . Smoking status: Never Smoker  . Smokeless tobacco: Never Used  Substance Use Topics  . Alcohol use: No  . Drug  use: No     Current Outpatient Medications:  .  amLODipine (NORVASC) 5 MG tablet, Take 5 mg by mouth daily., Disp: , Rfl:  .  buPROPion (WELLBUTRIN XL) 150 MG 24 hr tablet, Take 150 mg by mouth daily. , Disp: , Rfl:  .  ibuprofen (ADVIL) 800 MG tablet, Take 800 mg by mouth 3 (three) times daily., Disp: , Rfl:  .  lisinopril-hydrochlorothiazide (PRINZIDE,ZESTORETIC) 20-12.5 MG per tablet, Take 1 tablet by mouth daily., Disp: , Rfl:  .  Multiple Vitamins-Minerals (MULTIVITAMIN WITH MINERALS) tablet, Take 1 tablet by mouth daily., Disp: , Rfl:  .  omeprazole (PRILOSEC) 20 MG capsule, Take 20 mg by mouth daily. , Disp: , Rfl:  .  tamsulosin (FLOMAX) 0.4 MG CAPS capsule, Take 1 capsule (0.4 mg total) by mouth daily after supper., Disp: 30 capsule, Rfl: 5 .  gabapentin (NEURONTIN) 300 MG capsule, Take 600 mg by mouth at bedtime. , Disp: , Rfl:  .  HYDROcodone-acetaminophen (NORCO) 5-325 MG tablet, Take 1 tablet by mouth every 6 (six) hours as needed for moderate pain. (Patient not taking: Reported on 11/08/2019), Disp: 30 tablet, Rfl: 0  Temp (!) 97.3 F (36.3 C)   Ht 5\' 6"  (1.676 m)   Wt 182 lb (82.6 kg)   BMI 29.38 kg/m   He is awake alert and oriented x3  Mood affect normal  Gait is supported by crutches he is walking with a externally rotated leg  He is tender over the proximal hip his passive range of  motion really does not cause any pain and is not restricted there is no hip instability muscle tone is normal skin is intact distal pulses are intact no edema sensation is normal.  Coordination and balance are good  Upper extremities are normal  Initial outside films show femoral neck fracture nondisplaced  It took a better lateral to get a better assessment of the fracture angulation which was minimal  We scheduled him for surgery for ORIF right hip for tomorrow  Risks and benefits explained  Encounter Diagnosis  Name Primary?  . Closed fracture of neck of right femur, initial  encounter ([place]) 11/02/19 Yes

## 2019-11-08 NOTE — Telephone Encounter (Signed)
Initiated a prior authorization request for patients surgery tomorrow [phone] pending  [id number].

## 2019-11-08 NOTE — H&P (Signed)
Chief Complaint  Patient presents with  . Hip Injury      Right hip fracture, DOI 11-02-19. Referred by Dr. Nevada Crane.        Past Medical History:  Diagnosis Date  . GERD (gastroesophageal reflux disease)    . Hx of adenomatous colonic polyps    . Hypertension    . Prostate cancer ([place])    . Umbilical hernia with obstruction        History 79 year old male with no major medical problems status post seeding for prostate disease presents with a 60-day-old hip fracture referred to [place] by Dr. [place] Crane.  He fell at [facility] states on the ninth had x-rays on the 11th due to continued pain in his right hip and trouble walking   Presents now with mild pain which increases with weightbearing he is on crutches        Past Surgical History:  Procedure Laterality Date  . COLONOSCOPY   06/23/2007    [id number] polyps removed (cecum and hepatic flexure). The remainder of the colonic mucosa appeared normal/Left-sided diverticula/Normal rectum (tubular adenomas)  . COLONOSCOPY N/A 01/05/2014    Procedure: COLONOSCOPY;  Surgeon: Daneil Dolin, MD;  [facility];  Service: Endoscopy;  Laterality: N/A;  9:30  . ESOPHAGOGASTRODUODENOSCOPY N/A 01/05/2014    Procedure: ESOPHAGOGASTRODUODENOSCOPY (EGD);  Surgeon: Daneil Dolin, MD;  [facility];  Service: Endoscopy;  Laterality: N/A;  . KNEE ARTHROSCOPY   2003    right  . prostate biopsy   2016  . RADIOACTIVE SEED IMPLANT N/A 06/02/2015    Procedure: RADIOACTIVE SEED IMPLANT/BRACHYTHERAPY IMPLANT;  Surgeon: Festus Aloe, MD;  Location: [facility];  Service: Urology;  Laterality: N/A;  63 seeds implanted no seeds in bladder  . UMBILICAL HERNIA REPAIR N/A 02/12/2017    Procedure: HERNIA REPAIR UMBILICAL ADULT WITH MESH;  Surgeon: Aviva Signs, MD;  Location: [facility];  Service: General;  Laterality: N/A;      Social History        Tobacco Use  . Smoking status: Never Smoker  . Smokeless tobacco: Never Used   Substance Use Topics  . Alcohol use: No  . Drug use: No        Current Outpatient Medications:  .  amLODipine (NORVASC) 5 MG tablet, Take 5 mg by mouth daily., Disp: , Rfl:  .  buPROPion (WELLBUTRIN XL) 150 MG 24 hr tablet, Take 150 mg by mouth daily. , Disp: , Rfl:  .  ibuprofen (ADVIL) 800 MG tablet, Take 800 mg by mouth 3 (three) times daily., Disp: , Rfl:  .  lisinopril-hydrochlorothiazide (PRINZIDE,ZESTORETIC) 20-12.5 MG per tablet, Take 1 tablet by mouth daily., Disp: , Rfl:  .  Multiple Vitamins-Minerals (MULTIVITAMIN WITH MINERALS) tablet, Take 1 tablet by mouth daily., Disp: , Rfl:  .  omeprazole (PRILOSEC) 20 MG capsule, Take 20 mg by mouth daily. , Disp: , Rfl:  .  tamsulosin (FLOMAX) 0.4 MG CAPS capsule, Take 1 capsule (0.4 mg total) by mouth daily after supper., Disp: 30 capsule, Rfl: 5 .  gabapentin (NEURONTIN) 300 MG capsule, Take 600 mg by mouth at bedtime. , Disp: , Rfl:  .  HYDROcodone-acetaminophen (NORCO) 5-325 MG tablet, Take 1 tablet by mouth every 6 (six) hours as needed for moderate pain. (Patient not taking: Reported on 11/08/2019), Disp: 30 tablet, Rfl: 0   Temp (!) 97.3 F (36.3 C)   Ht 5\' 6"  (1.676 m)   Wt 182 lb (82.6 kg)   BMI  29.38 kg/m    He is awake alert and oriented x3   Mood affect normal   Gait is supported by crutches he is walking with a externally rotated leg   He is tender over the proximal hip his passive range of motion really does not cause any pain and is not restricted there is no hip instability muscle tone is normal skin is intact distal pulses are intact no edema sensation is normal.  Coordination and balance are good   Upper extremities are normal   Initial outside films show femoral neck fracture nondisplaced   It took a better lateral to get a better assessment of the fracture angulation which was minimal   We scheduled him for surgery for ORIF right hip for tomorrow   Risks and benefits explained       Encounter  Diagnosis  Name Primary?  . Closed fracture of neck of right femur, initial encounter ([place]) 11/02/19 Yes

## 2019-11-09 ENCOUNTER — Ambulatory Visit (HOSPITAL_COMMUNITY): Payer: Medicare Other | Admitting: Anesthesiology

## 2019-11-09 ENCOUNTER — Encounter (HOSPITAL_COMMUNITY)
Admission: RE | Disposition: A | Discharge status: Home / Self Care | Payer: Self-pay | Source: Ambulatory Visit | Attending: Orthopedic Surgery

## 2019-11-09 ENCOUNTER — Ambulatory Visit (HOSPITAL_COMMUNITY): Payer: Medicare Other

## 2019-11-09 ENCOUNTER — Ambulatory Visit (HOSPITAL_COMMUNITY)
Admission: RE | Admit: 2019-11-09 | Discharge: 2019-11-09 | Disposition: A | Discharge status: Home / Self Care | Payer: Medicare Other | Source: Ambulatory Visit | Attending: Orthopedic Surgery | Admitting: Orthopedic Surgery

## 2019-11-09 DIAGNOSIS — K219 Gastro-esophageal reflux disease without esophagitis: Secondary | ICD-10-CM | POA: Diagnosis not present

## 2019-11-09 DIAGNOSIS — Z86010 Personal history of colonic polyps: Secondary | ICD-10-CM | POA: Diagnosis not present

## 2019-11-09 DIAGNOSIS — S72001A Fracture of unspecified part of neck of right femur, initial encounter for closed fracture: Secondary | ICD-10-CM | POA: Diagnosis not present

## 2019-11-09 DIAGNOSIS — X58XXXA Exposure to other specified factors, initial encounter: Secondary | ICD-10-CM | POA: Insufficient documentation

## 2019-11-09 DIAGNOSIS — Z79899 Other long term (current) drug therapy: Secondary | ICD-10-CM | POA: Insufficient documentation

## 2019-11-09 DIAGNOSIS — S72041A Displaced fracture of base of neck of right femur, initial encounter for closed fracture: Secondary | ICD-10-CM | POA: Diagnosis not present

## 2019-11-09 DIAGNOSIS — Z8546 Personal history of malignant neoplasm of prostate: Secondary | ICD-10-CM | POA: Insufficient documentation

## 2019-11-09 DIAGNOSIS — Z20822 Contact with and (suspected) exposure to covid-19: Secondary | ICD-10-CM | POA: Insufficient documentation

## 2019-11-09 DIAGNOSIS — I10 Essential (primary) hypertension: Secondary | ICD-10-CM | POA: Insufficient documentation

## 2019-11-09 DIAGNOSIS — Z791 Long term (current) use of non-steroidal anti-inflammatories (NSAID): Secondary | ICD-10-CM | POA: Diagnosis not present

## 2019-11-09 HISTORY — PX: HIP PINNING,CANNULATED: SHX1758

## 2019-11-09 LAB — GLUCOSE, CAPILLARY: Glucose-Capillary: 122 mg/dL — ABNORMAL HIGH (ref 70–99)

## 2019-11-09 SURGERY — FIXATION, FEMUR, NECK, PERCUTANEOUS, USING SCREW
Anesthesia: Spinal | Site: Hip | Laterality: Right

## 2019-11-09 MED ORDER — ACETAMINOPHEN 500 MG PO TABS
500.0000 mg | ORAL_TABLET | Freq: Once | ORAL | Status: AC
  Administered 2019-11-09: 500 mg via ORAL
  Filled 2019-11-09: qty 1

## 2019-11-09 MED ORDER — SODIUM CHLORIDE 0.9 % IV BOLUS: 250.0000 mL | Freq: Once | INTRAVENOUS | 0 refills | Status: AC

## 2019-11-09 MED ORDER — LACTATED RINGERS IV SOLN: Freq: Once | INTRAVENOUS | Status: AC

## 2019-11-09 MED ORDER — ACETAMINOPHEN 500 MG PO TABS: 500.0000 mg | ORAL_TABLET | Freq: Once | ORAL | 0 refills | Status: AC

## 2019-11-09 MED ORDER — IBUPROFEN 400 MG PO TABS
400.0000 mg | ORAL_TABLET | Freq: Once | ORAL | Status: AC
  Administered 2019-11-09: 400 mg via ORAL
  Filled 2019-11-09: qty 1

## 2019-11-09 MED ORDER — SODIUM CHLORIDE 0.9 % IV BOLUS: 250.0000 mL | Freq: Once | INTRAVENOUS | Status: DC

## 2019-11-09 MED ORDER — CEFAZOLIN SODIUM-DEXTROSE 2-4 GM/100ML-% IV SOLN
INTRAVENOUS | Status: AC
  Filled 2019-11-09: qty 100

## 2019-11-09 MED ORDER — FENTANYL CITRATE (PF) 100 MCG/2ML IJ SOLN
INTRAMUSCULAR | Status: AC
  Filled 2019-11-09: qty 2

## 2019-11-09 MED ORDER — BUPIVACAINE IN DEXTROSE 0.75-8.25 % IT SOLN
INTRATHECAL | Status: DC | PRN
  Administered 2019-11-09: 1.6 mL via INTRATHECAL

## 2019-11-09 MED ORDER — PROPOFOL 10 MG/ML IV BOLUS
INTRAVENOUS | Status: AC
  Filled 2019-11-09: qty 60

## 2019-11-09 MED ORDER — ONDANSETRON HCL 4 MG/2ML IJ SOLN
INTRAMUSCULAR | Status: AC
  Filled 2019-11-09: qty 2

## 2019-11-09 MED ORDER — HYDROCODONE-ACETAMINOPHEN 5-325 MG PO TABS: 1.0000 | ORAL_TABLET | Freq: Four times a day (QID) | ORAL | 0 refills | Status: AC | PRN

## 2019-11-09 MED ORDER — 0.9 % SODIUM CHLORIDE (POUR BTL) OPTIME
TOPICAL | Status: DC | PRN
  Administered 2019-11-09: 1000 mL

## 2019-11-09 MED ORDER — PROPOFOL 500 MG/50ML IV EMUL
INTRAVENOUS | Status: DC | PRN
  Administered 2019-11-09: 50 ug/kg/min via INTRAVENOUS

## 2019-11-09 MED ORDER — LACTATED RINGERS IV SOLN: INTRAVENOUS | Status: DC | PRN

## 2019-11-09 MED ORDER — BUPIVACAINE-EPINEPHRINE (PF) 0.5% -1:200000 IJ SOLN
INTRAMUSCULAR | Status: DC | PRN
  Administered 2019-11-09: 60 mL via PERINEURAL

## 2019-11-09 MED ORDER — BUPIVACAINE-EPINEPHRINE (PF) 0.5% -1:200000 IJ SOLN
INTRAMUSCULAR | Status: AC
  Filled 2019-11-09: qty 60

## 2019-11-09 MED ORDER — CEFAZOLIN SODIUM-DEXTROSE 2-4 GM/100ML-% IV SOLN
2.0000 g | INTRAVENOUS | Status: AC
  Administered 2019-11-09: 2 g via INTRAVENOUS

## 2019-11-09 MED ORDER — FENTANYL CITRATE (PF) 250 MCG/5ML IJ SOLN
INTRAMUSCULAR | Status: DC | PRN
  Administered 2019-11-09 (×4): 12.5 ug via INTRAVENOUS

## 2019-11-09 MED ORDER — ONDANSETRON HCL 4 MG/2ML IJ SOLN
INTRAMUSCULAR | Status: DC | PRN
  Administered 2019-11-09: 4 mg via INTRAVENOUS

## 2019-11-09 MED ORDER — ONDANSETRON HCL 4 MG/2ML IJ SOLN: 4.0000 mg | Freq: Once | INTRAMUSCULAR | Status: DC | PRN

## 2019-11-09 MED ORDER — CHLORHEXIDINE GLUCONATE 4 % EX LIQD: 60.0000 mL | Freq: Once | CUTANEOUS | Status: DC

## 2019-11-09 SURGICAL SUPPLY — 56 items
BAG HAMPER (MISCELLANEOUS) ×3 IMPLANT
BENZOIN TINCTURE PRP APPL 2/3 (GAUZE/BANDAGES/DRESSINGS) ×2 IMPLANT
BIT DRILL 5 ACE CANN QC (BIT) ×3 IMPLANT
BLADE HEX COATED 2.75 (ELECTRODE) ×3 IMPLANT
BLADE SURG SZ10 CARB STEEL (BLADE) ×6 IMPLANT
BNDG GAUZE ELAST 4 BULKY (GAUZE/BANDAGES/DRESSINGS) ×3 IMPLANT
CHLORAPREP W/TINT 26 (MISCELLANEOUS) ×3 IMPLANT
CLOSURE WOUND 1/2 X4 (GAUZE/BANDAGES/DRESSINGS) ×1
CLOTH BEACON ORANGE TIMEOUT ST (SAFETY) ×3 IMPLANT
COVER LIGHT HANDLE STERIS (MISCELLANEOUS) ×6 IMPLANT
COVER MAYO STAND XLG (MISCELLANEOUS) IMPLANT
COVER WAND RF STERILE (DRAPES) ×3 IMPLANT
DECANTER SPIKE VIAL GLASS SM (MISCELLANEOUS) ×6 IMPLANT
DRAPE STERI IOBAN 125X83 (DRAPES) ×3 IMPLANT
DRESSING MEPILEX BORDER 6X8 (GAUZE/BANDAGES/DRESSINGS) ×1 IMPLANT
DRSG MEPILEX BORDER 4X12 (GAUZE/BANDAGES/DRESSINGS) ×2 IMPLANT
DRSG MEPILEX BORDER 6X8 (GAUZE/BANDAGES/DRESSINGS) ×3
GLOVE BIO SURGEON STRL SZ7 (GLOVE) ×4 IMPLANT
GLOVE BIOGEL PI IND STRL 7.0 (GLOVE) ×2 IMPLANT
GLOVE BIOGEL PI INDICATOR 7.0 (GLOVE) ×4
GLOVE SKINSENSE NS SZ8.0 LF (GLOVE) ×4
GLOVE SKINSENSE STRL SZ8.0 LF (GLOVE) ×2 IMPLANT
GLOVE SS N UNI LF 8.5 STRL (GLOVE) ×3 IMPLANT
GOWN STRL REUS W/TWL LRG LVL3 (GOWN DISPOSABLE) ×5 IMPLANT
GOWN STRL REUS W/TWL XL LVL3 (GOWN DISPOSABLE) ×3 IMPLANT
INST SET MAJOR BONE (KITS) ×3 IMPLANT
KIT BLADEGUARD II DBL (SET/KITS/TRAYS/PACK) ×3 IMPLANT
KIT TURNOVER CYSTO (KITS) ×3 IMPLANT
MANIFOLD NEPTUNE II (INSTRUMENTS) ×3 IMPLANT
MARKER SKIN DUAL TIP RULER LAB (MISCELLANEOUS) ×3 IMPLANT
NDL HYPO 21X1.5 SAFETY (NEEDLE) ×1 IMPLANT
NDL SPNL 18GX3.5 QUINCKE PK (NEEDLE) ×1 IMPLANT
NEEDLE HYPO 21X1.5 SAFETY (NEEDLE) ×3 IMPLANT
NEEDLE SPNL 18GX3.5 QUINCKE PK (NEEDLE) ×3 IMPLANT
NS IRRIG 1000ML POUR BTL (IV SOLUTION) ×3 IMPLANT
PACK BASIC III (CUSTOM PROCEDURE TRAY) ×3
PACK SRG BSC III STRL LF ECLPS (CUSTOM PROCEDURE TRAY) ×1 IMPLANT
PAD ABD 5X9 TENDERSORB (GAUZE/BANDAGES/DRESSINGS) ×3 IMPLANT
PENCIL SMOKE EVACUATOR COATED (MISCELLANEOUS) ×3 IMPLANT
PIN THREADED GUIDE ACE (PIN) ×9 IMPLANT
SCREW CANN 6.5 85MM (Screw) ×6 IMPLANT
SCREW CANN 6.5 95MM (Screw) ×3 IMPLANT
SCREW CANN LG 6.5 FLT 85X22 (Screw) IMPLANT
SCREW CANN LG 6.5 FLT 95X22 (Screw) IMPLANT
SET BASIN LINEN APH (SET/KITS/TRAYS/PACK) ×3 IMPLANT
SPONGE LAP 18X18 RF (DISPOSABLE) ×3 IMPLANT
STAPLER VISISTAT 35W (STAPLE) ×3 IMPLANT
STRIP CLOSURE SKIN 1/2X4 (GAUZE/BANDAGES/DRESSINGS) ×1 IMPLANT
SUT BRALON NAB BRD #1 30IN (SUTURE) IMPLANT
SUT MNCRL 0 VIOLET CTX 36 (SUTURE) ×1 IMPLANT
SUT MON AB 2-0 CT1 36 (SUTURE) ×3 IMPLANT
SUT MONOCRYL 0 CTX 36 (SUTURE) ×3
SYR 30ML LL (SYRINGE) ×3 IMPLANT
SYR BULB IRRIGATION 50ML (SYRINGE) ×6 IMPLANT
WASHER ACECAN 6.5 (Washer) ×6 IMPLANT
YANKAUER SUCT BULB TIP 10FT TU (MISCELLANEOUS) ×3 IMPLANT

## 2019-11-09 NOTE — Anesthesia Procedure Notes (Addendum)
Date/Time: 11/09/2019 9:45 AM Performed by: Vista Deck, CRNA Pre-anesthesia Checklist: Patient identified, Emergency Drugs available, Suction available, Timeout performed and Patient being monitored Patient Re-evaluated:Patient Re-evaluated prior to induction Oxygen Delivery Method: Nasal Cannula

## 2019-11-09 NOTE — Anesthesia Procedure Notes (Signed)
Spinal  Patient location during procedure: OR Start time: 11/09/2019 9:21 AM End time: 11/09/2019 9:31 AM Staffing Performed: anesthesiologist  Anesthesiologist: Denese Killings, MD Preanesthetic Checklist Completed: patient identified, IV checked, risks and benefits discussed, surgical consent, monitors and equipment checked, pre-op evaluation and timeout performed Spinal Block Patient position: sitting Prep: Betadine Patient monitoring: heart rate, continuous pulse ox and blood pressure Approach: midline Location: L3-4 Injection technique: single-shot Needle Needle type: Spinocan  Needle gauge: 24 G Needle length: 10 cm Needle insertion depth: 5 cm Assessment Sensory level: T8

## 2019-11-09 NOTE — Transfer of Care (Signed)
Immediate Anesthesia Transfer of Care Note  Patient: Alejandro Krueger  Procedure(s) Performed: CANNULATED HIP PINNING (Right Hip)  Patient Location: PACU  Anesthesia Type:Spinal  Level of Consciousness: awake, alert  and patient cooperative  Airway & Oxygen Therapy: Patient Spontanous Breathing  Post-op Assessment: Report given to RN and Post -op Vital signs reviewed and stable  Post vital signs: Reviewed and stable  Last Vitals:  Vitals Value Taken Time  BP    Temp    Pulse 72 11/09/19 1049  Resp 27 11/09/19 1049  SpO2 94 % 11/09/19 1049  Vitals shown include unvalidated device data.  Last Pain:  Vitals:   11/09/19 0733  TempSrc: Oral  PainSc: 2       Patients Stated Pain Goal: 7 ([id number])  Complications: No apparent anesthesia complications  SEE PACU FLOWSHEET FOR VITAL SIGNS.

## 2019-11-09 NOTE — Anesthesia Preprocedure Evaluation (Signed)
Anesthesia Evaluation  Patient identified by MRN, date of birth, ID band Patient awake    Reviewed: Allergy & Precautions, NPO status , Patient's Chart, lab work & pertinent test results  Airway Mallampati: II  TM Distance: >3 FB Neck ROM: Full    Dental  (+) Missing, Dental Advisory Given   Pulmonary neg pulmonary ROS,    breath sounds clear to auscultation       Cardiovascular Exercise Tolerance: Good hypertension, Pt. on medications  Rhythm:Regular Rate:Normal  08-Nov-2019 13:57:21 [facility] ROUTINE RECORD Sinus rhythm with Premature atrial complexes Septal infarct , age undetermined Baseline wander Abnormal ECG Confirmed by Cherlynn Kaiser [phone]) on 11/08/2019 7:30:49 PM   Neuro/Psych negative neurological ROS  negative psych ROS   GI/Hepatic Neg liver ROS, GERD  Medicated,  Endo/Other  negative endocrine ROS  Renal/GU negative Renal ROS  negative genitourinary   Musculoskeletal negative musculoskeletal ROS (+)   Abdominal   Peds negative pediatric ROS (+)  Hematology negative hematology ROS (+)   Anesthesia Other Findings   Reproductive/Obstetrics negative OB ROS                            Anesthesia Physical Anesthesia Plan  ASA: II  Anesthesia Plan: General/Spinal   Post-op Pain Management:    Induction: Intravenous  PONV Risk Score and Plan: 3 and Ondansetron, Dexamethasone and Treatment may vary due to age or medical condition  Airway Management Planned: LMA  Additional Equipment:   Intra-op Plan:   Post-operative Plan: Extubation in OR  Informed Consent: I have reviewed the patients History and Physical, chart, labs and discussed the procedure including the risks, benefits and alternatives for the proposed anesthesia with the patient or authorized representative who has indicated his/her understanding and acceptance.     Dental advisory  given  Plan Discussed with: CRNA  Anesthesia Plan Comments:        Anesthesia Quick Evaluation

## 2019-11-09 NOTE — Anesthesia Postprocedure Evaluation (Signed)
Anesthesia Post Note  Patient: Alejandro Krueger  Procedure(s) Performed: CANNULATED HIP PINNING (Right Hip)  Patient location during evaluation: PACU Anesthesia Type: Spinal Level of consciousness: awake and alert and patient cooperative Pain management: satisfactory to patient Vital Signs Assessment: post-procedure vital signs reviewed and stable Respiratory status: spontaneous breathing Cardiovascular status: stable Postop Assessment: no apparent nausea or vomiting, patient able to bend at knees and spinal receding Anesthetic complications: no     Last Vitals:  Vitals:   11/09/19 1245 11/09/19 1300  BP: (!) 132/91 135/72  Pulse: 63   Resp: 13   Temp:    SpO2:      Last Pain:  Vitals:   11/09/19 1245  TempSrc:   PainSc: 0-No pain                 Loetta Connelley

## 2019-11-09 NOTE — Progress Notes (Signed)
Spoke with Dr.Harrison who stated patient may be discharged from PACU/PHASE II if he walks well with crutches or walker, tolerated PO's and fluids and voiding.

## 2019-11-09 NOTE — Op Note (Signed)
11/09/2019  10:43 AM  PATIENT:  Alejandro Krueger  79 y.o. male  PRE-OPERATIVE DIAGNOSIS:  fracture right femoral neck  POST-OPERATIVE DIAGNOSIS:  fracture right femoral neck  PROCEDURE:  Procedure(s): CANNULATED HIP PINNING (Right)   Findings: minimally displaced minimally angulated femoral neck fracture right hip   3 screws and 3 washers // titanium screws; depuy ace system   SURGEON:  Surgeon(s) and Role:    Carole Civil, MD - Primary  The surgery was performed in the following manner: The patient was identified in the preop.  The right hip was confirmed as the surgical site chart review was completed images were also reviewed and conformation of implants was done.  The patient was taken to the operating room for spinal anesthesia and then placed on the fracture table with the well leg padded in abduction external rotation in the operative leg which was the right leg placed in traction.  The fracture was minimally displaced and minimally angulated and no additional manipulation was necessary  C-arm was brought in and appropriate images were obtained.  The fracture position was confirmed and fracture fixation with screws was also confirmed.  After sterile prep and drape and timeout an incision was made at the greater trochanter was extended distally the subcutaneous tissue was divided along with the fascia.  The vastus lateralis muscle was retracted anteriorly and an L-shaped incision was made in the attachment to the greater trochanter and subperiosteal dissection was performed to expose the proximal femur  We started with a pen inferiorly on the inferior neck confirmed its position to be center of the femoral head on the lateral x-ray and then used a cannulated pinning guide to place 2 other pins in parallel fashion superior to the first pin  Images in multiple planes confirmed position  Each pin was measured the inferior pin was measured for 90 mm and the 2 superior pins  were measured for 85 mm  The first screw was placed after drilling with addition of a washer and it was a 22 mm thread to allow some compression however, to avoid any further collapse of the femoral head neck and lead to shortening of the limb the proximal 2 screws were of longer thread.  After all screws were placed and tightened final images confirmed position of the fracture to be in the reduced state and acceptable alignment along with screws that were all in the femoral head  The wounds were irrigated and closed with #1 Braylon 0 Monocryl and 2-0 Monocryl Steri-Strips and sterile dressing  Postoperative plan the patient be weightbearing as tolerated with crutches or walker once comfortable he can convert to a cane and then independent ambulation  He will be on aspirin for 35 days for DVT prophylaxis  Follow-up in the office in 2 weeks  PHYSICIAN ASSISTANT:   ASSISTANTS: none   ANESTHESIA:   spinal  EBL:  50 mL   BLOOD ADMINISTERED:none  DRAINS: none   LOCAL MEDICATIONS USED:  MARCAINE    and Amount: 60 ml  SPECIMEN:  No Specimen  DISPOSITION OF SPECIMEN:  N/A  COUNTS:  YES  TOURNIQUET:  * No tourniquets in log *  DICTATION: .Dragon Dictation and Note written in paper chart  PLAN OF CARE: Admit to inpatient   PATIENT DISPOSITION:  PACU - hemodynamically stable.   Delay start of Pharmacological VTE agent (>24hrs) due to surgical blood loss or risk of bleeding: yes

## 2019-11-09 NOTE — Discharge Instructions (Signed)
PATIENT INSTRUCTIONS POST-ANESTHESIA  IMMEDIATELY FOLLOWING SURGERY:  Do not drive or operate machinery for the first twenty four hours after surgery.  Do not make any important decisions for twenty four hours after surgery or while taking narcotic pain medications or sedatives.  If you develop intractable nausea and vomiting or a severe headache please notify your doctor immediately.  FOLLOW-UP:  Please make an appointment with your surgeon as instructed. You do not need to follow up with anesthesia unless specifically instructed to do so.  WOUND CARE INSTRUCTIONS (if applicable):  Keep a dry clean dressing on the anesthesia/puncture wound site if there is drainage.  Once the wound has quit draining you may leave it open to air.  Generally you should leave the bandage intact for twenty four hours unless there is drainage.  If the epidural site drains for more than 36-48 hours please call the anesthesia department.  QUESTIONS?:  Please feel free to call your physician or the hospital operator if you have any questions, and they will be happy to assist you.       Spinal Anesthesia and Epidural Anesthesia, Care After This sheet gives you information about how to care for yourself after your procedure. Your doctor may also give you more specific instructions. If you have problems or questions, call your doctor. Follow these instructions at home: For at least 24 hours after the procedure:   Have a responsible adult stay with you. It is important to have someone help care for you until you are awake and alert.  Rest as needed.  Do not do activities where you could fall or get hurt (injured).  Do not drive.  Do not use heavy machinery.  Do not drink alcohol.  Do not take sleeping pills or medicines that make you sleepy (drowsy).  Do not make important decisions.  Do not sign legal documents.  Do not take care of children on your own. Eating and drinking  If you throw up (vomit),  drink water, juice, or soup when nausea and vomiting stop.  Drink enough fluid to keep your pee (urine) pale yellow.  Make sure you do not feel like throwing up (nauseous) before you eat solid foods.  Follow the diet that your doctor recommends. General instructions  Return to your normal activities as told by your doctor. Ask your doctor what activities are safe for you.  Take over-the-counter and prescription medicines only as told by your doctor.  If you have sleep apnea, surgery and certain medicines can raise your risk for breathing problems. Follow instructions from your doctor about when to wear your sleep device. Your doctor may tell you to wear your sleep device: ? Anytime you are sleeping, including during daytime naps. ? While taking prescription pain medicines, sleeping pills, or medicines that make you sleepy.  Do not use any products that contain nicotine or tobacco. This includes cigarettes and e-cigarettes. ? If you need help quitting, ask your doctor. ? If you smoke, do not smoke by yourself. Make sure someone is nearby in case you need help.  Keep all follow-up visits as told by your doctor. This is important. Contact a doctor if:  It has been more than one day since your procedure and you feel like throwing up.  It has been more than one day since your procedure and you throw up.  You have a rash. Get help right away if:  You have a fever.  You have a headache that lasts a long time.  You have a very bad headache.  Your vision is blurry.  You see two of a single object (double vision).  You are dizzy or light-headed.  You faint.  Your arms or legs tingle, feel weak, or get numb.  You have trouble breathing.  You cannot pee (urinate). Summary  After the procedure, have a responsible adult stay with you at home until you are fully awake and alert.  Do not do activities that might get you injured. Do not drive, use heavy machinery, drink alcohol,  or make important decisions for 24 hours after the procedure.  Take medicines as told by your doctor. Do not use products that contain nicotine or tobacco.  Get help right away if you have a fever, blurry vision, difficulty breathing or passing urine, or weakness or numbness in arms or legs. This information is not intended to replace advice given to you by your health care provider. Make sure you discuss any questions you have with your health care provider. Document Revised: 07/25/2017 Document Reviewed: 12/04/2015 Elsevier Patient Education  [place] 81MG  ASPIRIN A DAY FOR 35 DAYS PER DR. HARRISON.   Incision Care, Adult An incision is a cut that a doctor makes in your skin for surgery (for a procedure). Most times, these cuts are closed after surgery. Your cut from surgery may be closed with stitches (sutures), staples, skin glue, or skin tape (adhesive strips). You may need to return to your doctor to have stitches or staples taken out. This may happen many days or many weeks after your surgery. The cut needs to be well cared for so it does not get infected. How to care for your cut Cut care   Follow instructions from your doctor about how to take care of your cut. Make sure you: ? Wash your hands with soap and water before you change your bandage (dressing). If you cannot use soap and water, use hand sanitizer. ? Change your bandage as told by your doctor. ? Leave stitches, skin glue, or skin tape in place. They may need to stay in place for 2 weeks or longer. If tape strips get loose and curl up, you may trim the loose edges. Do not remove tape strips completely unless your doctor says it is okay.  Check your cut area every day for signs of infection. Check for: ? More redness, swelling, or pain. ? More fluid or blood. ? Warmth. ? Pus or a bad smell.  Ask your doctor how to clean the cut. This may include: ? Using mild soap and water. ? Using a clean towel  to pat the cut dry after you clean it. ? Putting a cream or ointment on the cut. Do this only as told by your doctor. ? Covering the cut with a clean bandage.  Ask your doctor when you can leave the cut uncovered.  Do not take baths, swim, or use a hot tub until your doctor says it is okay. Ask your doctor if you can take showers. You may only be allowed to take sponge baths for bathing. Medicines  If you were prescribed an antibiotic medicine, cream, or ointment, take the antibiotic or put it on the cut as told by your doctor. Do not stop taking or putting on the antibiotic even if your condition gets better.  Take over-the-counter and prescription medicines only as told by your doctor. General instructions  Limit movement around your cut. This helps healing. ? Avoid straining,  lifting, or exercise for the first month, or for as long as told by your doctor. ? Follow instructions from your doctor about going back to your normal activities. ? Ask your doctor what activities are safe.  Protect your cut from the sun when you are outside for the first 6 months, or for as long as told by your doctor. Put on sunscreen around the scar or cover up the scar.  Keep all follow-up visits as told by your doctor. This is important. Contact a doctor if:  Your have more redness, swelling, or pain around the cut.  You have more fluid or blood coming from the cut.  Your cut feels warm to the touch.  You have pus or a bad smell coming from the cut.  You have a fever or shaking chills.  You feel sick to your stomach (nauseous) or you throw up (vomit).  You are dizzy.  Your stitches or staples come undone. Get help right away if:  You have a red streak coming from your cut.  Your cut bleeds through the bandage and the bleeding does not stop with gentle pressure.  The edges of your cut open up and separate.  You have very bad (severe) pain.  You have a rash.  You are confused.  You  pass out (faint).  You have trouble breathing and you have a fast heartbeat. This information is not intended to replace advice given to you by your health care provider. Make sure you discuss any questions you have with your health care provider. Document Revised: 12/30/2016 Document Reviewed: 04/19/2016 Elsevier Patient Education  2020 [employer].

## 2019-11-09 NOTE — Interval H&P Note (Signed)
History and Physical Interval Note:  11/09/2019 8:52 AM  Alejandro Krueger  has presented today for surgery, with the diagnosis of fracture right femoral neck.  The various methods of treatment have been discussed with the patient and family. After consideration of risks, benefits and other options for treatment, the patient has consented to  Procedure(s): CANNULATED HIP PINNING (Right) as a surgical intervention.  The patient's history has been reviewed, patient examined, no change in status, stable for surgery.  I have reviewed the patient's chart and labs.  Questions were answered to the patient's satisfaction.     Arther Abbott

## 2019-11-09 NOTE — Brief Op Note (Signed)
11/09/2019  10:43 AM  PATIENT:  Alejandro Krueger  79 y.o. male  PRE-OPERATIVE DIAGNOSIS:  fracture right femoral neck  POST-OPERATIVE DIAGNOSIS:  fracture right femoral neck  PROCEDURE:  Procedure(s): CANNULATED HIP PINNING (Right)   Findings: minimally displaced minimally angulated femoral neck fracture right hip   3 screws and 3 washers // titanium screws; depuy ace system   SURGEON:  Surgeon(s) and Role:    Carole Civil, MD - Primary  The surgery was performed in the following manner: The patient was identified in the preop.  The right hip was confirmed as the surgical site chart review was completed images were also reviewed and conformation of implants was done.  The patient was taken to the operating room for spinal anesthesia and then placed on the fracture table with the well leg padded in abduction external rotation in the operative leg which was the right leg placed in traction.  The fracture was minimally displaced and minimally angulated and no additional manipulation was necessary  C-arm was brought in and appropriate images were obtained.  The fracture position was confirmed and fracture fixation with screws was also confirmed.  After sterile prep and drape and timeout an incision was made at the greater trochanter was extended distally the subcutaneous tissue was divided along with the fascia.  The vastus lateralis muscle was retracted anteriorly and an L-shaped incision was made in the attachment to the greater trochanter and subperiosteal dissection was performed to expose the proximal femur  We started with a pen inferiorly on the inferior neck confirmed its position to be center of the femoral head on the lateral x-ray and then used a cannulated pinning guide to place 2 other pins in parallel fashion superior to the first pin  Images in multiple planes confirmed position  Each pin was measured the inferior pin was measured for 90 mm and the 2 superior pins  were measured for 85 mm  The first screw was placed after drilling with addition of a washer and it was a 22 mm thread to allow some compression however, to avoid any further collapse of the femoral head neck and lead to shortening of the limb the proximal 2 screws were of longer thread.  After all screws were placed and tightened final images confirmed position of the fracture to be in the reduced state and acceptable alignment along with screws that were all in the femoral head  The wounds were irrigated and closed with #1 Braylon 0 Monocryl and 2-0 Monocryl Steri-Strips and sterile dressing  Postoperative plan the patient be weightbearing as tolerated with crutches or walker once comfortable he can convert to a cane and then independent ambulation  He will be on aspirin for 35 days for DVT prophylaxis  Follow-up in the office in 2 weeks  PHYSICIAN ASSISTANT:   ASSISTANTS: none   ANESTHESIA:   spinal  EBL:  50 mL   BLOOD ADMINISTERED:none  DRAINS: none   LOCAL MEDICATIONS USED:  MARCAINE    and Amount: 60 ml  SPECIMEN:  No Specimen  DISPOSITION OF SPECIMEN:  N/A  COUNTS:  YES  TOURNIQUET:  * No tourniquets in log *  DICTATION: .Dragon Dictation and Note written in paper chart  PLAN OF CARE: Admit to inpatient   PATIENT DISPOSITION:  PACU - hemodynamically stable.   Delay start of Pharmacological VTE agent (>24hrs) due to surgical blood loss or risk of bleeding: yes

## 2019-11-22 ENCOUNTER — Encounter: Payer: Self-pay | Admitting: Orthopedic Surgery

## 2019-11-22 ENCOUNTER — Ambulatory Visit (INDEPENDENT_AMBULATORY_CARE_PROVIDER_SITE_OTHER): Payer: Medicare Other | Admitting: Orthopedic Surgery

## 2019-11-22 ENCOUNTER — Other Ambulatory Visit: Payer: Self-pay

## 2019-11-22 ENCOUNTER — Ambulatory Visit: Payer: Medicare Other

## 2019-11-22 DIAGNOSIS — S72001A Fracture of unspecified part of neck of right femur, initial encounter for closed fracture: Secondary | ICD-10-CM | POA: Diagnosis not present

## 2019-11-22 DIAGNOSIS — Z8781 Personal history of (healed) traumatic fracture: Secondary | ICD-10-CM

## 2019-11-22 DIAGNOSIS — S72001D Fracture of unspecified part of neck of right femur, subsequent encounter for closed fracture with routine healing: Secondary | ICD-10-CM | POA: Diagnosis not present

## 2019-11-22 DIAGNOSIS — Z9889 Other specified postprocedural states: Secondary | ICD-10-CM

## 2019-11-22 MED ORDER — HYDROCODONE-ACETAMINOPHEN 5-325 MG PO TABS: 1.0000 | ORAL_TABLET | Freq: Two times a day (BID) | ORAL | 0 refills | Status: AC | PRN

## 2019-11-22 NOTE — Patient Instructions (Signed)
Try to use tylenol or advil for pain and if that doesn't relieve it  Take a pain pill  Walk with a cane until you see me again

## 2019-11-22 NOTE — Progress Notes (Signed)
Chief Complaint  Patient presents with  . Routine Post Op    right hip 11/09/19    Encounter Diagnoses  Name Primary?  . Closed fracture of neck of right femur with routine healing, subsequent encounter Yes  . S/P ORIF (open reduction internal fixation) fracture 11/09/19 right hip pinning    PRE-OPERATIVE DIAGNOSIS:  fracture right femoral neck   POST-OPERATIVE DIAGNOSIS:  fracture right femoral neck   PROCEDURE:  Procedure(s): CANNULATED HIP PINNING (Right)    Findings: minimally displaced minimally angulated femoral neck fracture right hip    3 screws and 3 washers // titanium screws; depuy ace system

## 2019-12-22 ENCOUNTER — Other Ambulatory Visit: Payer: Self-pay

## 2019-12-22 ENCOUNTER — Ambulatory Visit: Payer: Medicare Other

## 2019-12-22 ENCOUNTER — Ambulatory Visit (INDEPENDENT_AMBULATORY_CARE_PROVIDER_SITE_OTHER): Payer: Medicare Other | Admitting: Orthopedic Surgery

## 2019-12-22 DIAGNOSIS — Z9889 Other specified postprocedural states: Secondary | ICD-10-CM

## 2019-12-22 DIAGNOSIS — S72001D Fracture of unspecified part of neck of right femur, subsequent encounter for closed fracture with routine healing: Secondary | ICD-10-CM | POA: Diagnosis not present

## 2019-12-22 DIAGNOSIS — Z8781 Personal history of (healed) traumatic fracture: Secondary | ICD-10-CM

## 2019-12-22 NOTE — Progress Notes (Signed)
No chief complaint on file.   Encounter Diagnoses  Name Primary?  . S/P ORIF (open reduction internal fixation) fracture 11/09/19 right hip pinning   . Closed fracture of neck of right femur with routine healing, subsequent encounter Yes     PRE-OPERATIVE DIAGNOSIS:  fracture right femoral neck   POST-OPERATIVE DIAGNOSIS:  fracture right femoral neck   PROCEDURE:  Procedure(s): CANNULATED HIP PINNING (Right)

## 2019-12-22 NOTE — Progress Notes (Signed)
Chief Complaint  Patient presents with  . Routine Post Op    11/02/19 right hip improving     Encounter Diagnoses  Name Primary?  . S/P ORIF (open reduction internal fixation) fracture 11/09/19 right hip pinning   . Closed fracture of neck of right femur with routine healing, subsequent encounter Yes     PRE-OPERATIVE DIAGNOSIS:  fracture right femoral neck   POST-OPERATIVE DIAGNOSIS:  fracture right femoral neck   PROCEDURE:  Procedure(s): CANNULATED HIP PINNING (Right)   6 weeks post cannulated pinning right hip  Patient still using his cane but he is off all pain medication not even taken any Tylenol  He has good hip flexion his wound looks good  He still has a limp almost like 1 leg is longer than the other  Recommend continue exercises and advance activities as tolerated  X-ray 6 weeks

## 2020-01-31 DIAGNOSIS — Z8781 Personal history of (healed) traumatic fracture: Secondary | ICD-10-CM | POA: Insufficient documentation

## 2020-02-02 ENCOUNTER — Encounter: Payer: Self-pay | Admitting: Orthopedic Surgery

## 2020-02-02 ENCOUNTER — Other Ambulatory Visit: Payer: Self-pay

## 2020-02-02 ENCOUNTER — Ambulatory Visit (INDEPENDENT_AMBULATORY_CARE_PROVIDER_SITE_OTHER): Payer: Medicare Other | Admitting: Orthopedic Surgery

## 2020-02-02 ENCOUNTER — Ambulatory Visit: Payer: Medicare Other

## 2020-02-02 DIAGNOSIS — Z9889 Other specified postprocedural states: Secondary | ICD-10-CM

## 2020-02-02 DIAGNOSIS — S72001D Fracture of unspecified part of neck of right femur, subsequent encounter for closed fracture with routine healing: Secondary | ICD-10-CM

## 2020-02-02 DIAGNOSIS — Z8781 Personal history of (healed) traumatic fracture: Secondary | ICD-10-CM

## 2020-02-02 NOTE — Progress Notes (Signed)
Chief Complaint  Patient presents with  . Routine Post Op    11/02/19 right hip fracture fixation/ fell last week has had increased pain since    Alejandro Krueger did have a fall landed on his bottom says his hips not really hurting him but is gluteal area is uncomfortable  His x-ray today shows that the fracture is healed he did have some sliding and there is prominence of the 3 screws on clinical exam it does not bother him he cannot really feel them the wound looks fine hip motion is normal  He is regained a good gait pattern  Recommend follow-up as needed and he is aware that if the screws bother him they can be removed

## 2020-02-09 DIAGNOSIS — D126 Benign neoplasm of colon, unspecified: Secondary | ICD-10-CM | POA: Diagnosis not present

## 2020-02-09 DIAGNOSIS — E1122 Type 2 diabetes mellitus with diabetic chronic kidney disease: Secondary | ICD-10-CM | POA: Diagnosis not present

## 2020-02-09 DIAGNOSIS — D72829 Elevated white blood cell count, unspecified: Secondary | ICD-10-CM | POA: Diagnosis not present

## 2020-02-09 DIAGNOSIS — D509 Iron deficiency anemia, unspecified: Secondary | ICD-10-CM | POA: Diagnosis not present

## 2020-02-09 DIAGNOSIS — B0222 Postherpetic trigeminal neuralgia: Secondary | ICD-10-CM | POA: Diagnosis not present

## 2020-02-14 DIAGNOSIS — E1122 Type 2 diabetes mellitus with diabetic chronic kidney disease: Secondary | ICD-10-CM | POA: Diagnosis not present

## 2020-02-14 DIAGNOSIS — N1831 Chronic kidney disease, stage 3a: Secondary | ICD-10-CM | POA: Diagnosis not present

## 2020-02-14 DIAGNOSIS — E782 Mixed hyperlipidemia: Secondary | ICD-10-CM | POA: Diagnosis not present

## 2020-02-14 DIAGNOSIS — Z0001 Encounter for general adult medical examination with abnormal findings: Secondary | ICD-10-CM | POA: Diagnosis not present

## 2020-02-14 DIAGNOSIS — I10 Essential (primary) hypertension: Secondary | ICD-10-CM | POA: Diagnosis not present

## 2020-02-14 DIAGNOSIS — R944 Abnormal results of kidney function studies: Secondary | ICD-10-CM | POA: Diagnosis not present

## 2020-02-25 DIAGNOSIS — R3912 Poor urinary stream: Secondary | ICD-10-CM | POA: Diagnosis not present

## 2020-03-15 ENCOUNTER — Other Ambulatory Visit: Payer: Self-pay

## 2020-03-15 ENCOUNTER — Encounter: Payer: Self-pay | Admitting: Orthopedic Surgery

## 2020-03-15 ENCOUNTER — Ambulatory Visit: Payer: Medicare Other

## 2020-03-15 ENCOUNTER — Ambulatory Visit (INDEPENDENT_AMBULATORY_CARE_PROVIDER_SITE_OTHER): Payer: Medicare Other | Admitting: Orthopedic Surgery

## 2020-03-15 VITALS — BP 143/82 | HR 97 | Ht 66.0 in | Wt 180.0 lb

## 2020-03-15 DIAGNOSIS — M5116 Intervertebral disc disorders with radiculopathy, lumbar region: Secondary | ICD-10-CM | POA: Diagnosis not present

## 2020-03-15 DIAGNOSIS — S72001D Fracture of unspecified part of neck of right femur, subsequent encounter for closed fracture with routine healing: Secondary | ICD-10-CM | POA: Diagnosis not present

## 2020-03-15 MED ORDER — METHOCARBAMOL 500 MG PO TABS: 500.0000 mg | ORAL_TABLET | Freq: Three times a day (TID) | ORAL | 1 refills | Status: DC

## 2020-03-15 MED ORDER — PREDNISONE 10 MG (48) PO TBPK: ORAL_TABLET | Freq: Every day | ORAL | 1 refills | Status: DC

## 2020-03-15 NOTE — Progress Notes (Signed)
Alejandro Krueger  03/15/2020  Body mass index is 29.05 kg/m.   S:  Chief Complaint  Patient presents with  . Leg Pain    right leg pain/ denies back pain entire leg painful    79 year old male had ORIF 11/09/19, (4 months ago) of his right hip with cannulated screws for femoral neck fracture after he walked on it for about a week without knowing this fracture  Last x-rays show the fracture is healing well and the screws have slid some and causing some prominence on the lateral portion beneath the trochanter  Comes in today after going on a drive complaining of pain in his right leg Pain radiates down to his foot  He also feels something moving over his right hip  O: BP (!) 143/82   Pulse 97   Ht 5\' 6"  (1.676 m)   Wt 180 lb (81.6 kg)   BMI 29.05 kg/m   Physical Exam Constitutional:      General: He is not in acute distress.    Appearance: He is well-developed.  Cardiovascular:     Comments: No peripheral edema Musculoskeletal:     Comments: Right hip mild tenderness over the greater trochanter screws are prominent  Tenderness in the L4-5 and L5-S1 interspace normal hip flexion normal hip rotation without pain negative straight leg raise normal sensation normal pulse  Skin:    General: Skin is warm and dry.  Neurological:     Mental Status: He is alert and oriented to person, place, and time.     Sensory: No sensory deficit.     Coordination: Coordination normal.     Gait: Gait abnormal.     Deep Tendon Reflexes: Reflexes are normal and symmetric.     MEDICAL DECISION MAKING  Encounter Diagnoses  Name Primary?  . Closed fracture of neck of right femur with routine healing, subsequent encounter   . Intervertebral disc disorders with radiculopathy, lumbar region Yes   IMAGING: Independent interpretation of images: New x-rays of the pelvis and hip showed no evidence of avascular necrosis or nonunion screws are intact screws are prominent as noted  before    MANAGEMENT   I told the patient to take Robaxin and if no improvement take the Dosepak for 12 days  Meds ordered this encounter  Medications  . predniSONE (STERAPRED UNI-PAK 48 TAB) 10 MG (48) TBPK tablet    Sig: Take by mouth daily.    Dispense:  48 tablet    Refill:  1  . methocarbamol (ROBAXIN) 500 MG tablet    Sig: Take 1 tablet (500 mg total) by mouth 3 (three) times daily.    Dispense:  60 tablet    Refill:  1      Arther Abbott, MD  03/15/2020 10:40 AM

## 2020-03-15 NOTE — Patient Instructions (Addendum)
If it starts hurting take 1 robaxin if t continues add sterapred dose pack for 12 days   Diagnosis   Disc bulge with nerve pinched    Degenerative Disk Disease  Degenerative disk disease is a condition caused by changes that occur in the spinal disks as a person ages. Spinal disks are soft and compressible disks located between the bones of your spine (vertebrae). These disks act like shock absorbers. Degenerative disk disease can affect the whole spine. However, the neck and lower back are most often affected. Many changes can occur in the spinal disks with aging, such as:  The spinal disks may dry and shrink.  Small tears may occur in the tough, outer covering of the disk (annulus).  The disk space may become smaller due to loss of water.  Abnormal growths in the bone (spurs) may occur. This can put pressure on the nerve roots exiting the spinal canal, causing pain.  The spinal canal may become narrowed. What are the causes? This condition may be caused by:  Normal degeneration with age.  Injuries.  Certain activities and sports that cause damage. What increases the risk? The following factors may make you more likely to develop this condition:  Being overweight.  Having a family history of degenerative disk disease.  Smoking.  Sudden injury.  Doing work that requires heavy lifting. What are the signs or symptoms? Symptoms of this condition include:  Pain that varies in intensity. Some people have no pain, while others have severe pain. The location of the pain depends on the part of your backbone that is affected. You may have: ? Pain in your neck or arm if a disk in your neck area is affected. ? Pain in your back, buttocks, or legs if a disk in your lower back is affected.  Pain that becomes worse while bending or reaching up, or with twisting movements.  Pain that may start gradually and then get worse as time passes. It may also start after a major or minor  injury.  Numbness or tingling in the arms or legs. How is this diagnosed? This condition may be diagnosed based on:  Your symptoms and medical history.  A physical exam.  Imaging tests, including: ? An X-ray of the spine. ? MRI. How is this treated? This condition may be treated with:  Medicines.  Rehabilitation exercises. These activities aim to strengthen muscles in your back and abdomen to better support your spine. If treatments do not help to relieve your symptoms or you have severe pain, you may need surgery. Follow these instructions at home: Medicines  Take over-the-counter and prescription medicines only as told by your health care provider.  Do not drive or use heavy machinery while taking prescription pain medicine.  If you are taking prescription pain medicine, take actions to prevent or treat constipation. Your health care provider may recommend that you: ? Drink enough fluid to keep your urine pale yellow. ? Eat foods that are high in fiber, such as fresh fruits and vegetables, whole grains, and beans. ? Limit foods that are high in fat and processed sugars, such as fried or sweet foods. ? Take an over-the-counter or prescription medicine for constipation. Activity  Rest as told by your health care provider.  Ask your health care provider what activities are safe for you. Return to your normal activities as directed.  Avoid sitting for a long time without moving. Get up to take short walks every 1-2 hours. This is important  to improve blood flow and breathing. Ask for help if you feel weak or unsteady.  Perform relaxation exercises as told by your health care provider.  Maintain good posture.  Do not lift anything that is heavier than 10 lb (4.5 kg), or the limit that you are told, until your health care provider says that it is safe.  Follow proper lifting and walking techniques as told by your health care provider. Managing pain, stiffness, and  swelling   If directed, put ice on the painful area. Icing can help to relieve pain. ? Put ice in a plastic bag. ? Place a towel between your skin and the bag. ? Leave the ice on for 20 minutes, 2-3 times a day.  If directed, apply heat to the painful area as often as told by your health care provider. Heat can reduce the stiffness of your muscles. Use the heat source that your health care provider recommends, such as a moist heat pack or a heating pad. ? Place a towel between your skin and the heat source. ? Leave the heat on for 20-30 minutes. ? Remove the heat if your skin turns bright red. This is especially important if you are unable to feel pain, heat, or cold. You may have a greater risk of getting burned. General instructions  Change your sitting, standing, and sleeping habits as told by your health care provider.  Avoid sitting in the same position for long periods of time. Change positions frequently.  Lose weight or maintain a healthy weight as told by your health care provider.  Do not use any products that contain nicotine or tobacco, such as cigarettes and e-cigarettes. If you need help quitting, ask your health care provider.  Wear supportive footwear.  Keep all follow-up visits as told by your health care provider. This is important. This may include visits for physical therapy. Contact a health care provider if you:  Have pain that does not go away within 1-4 weeks.  Lose your appetite.  Lose weight without trying. Get help right away if you:  Have severe pain.  Notice weakness in your arms, hands, or legs.  Begin to lose control of your bladder or bowel movements.  Have fevers or night sweats. Summary  Degenerative disk disease is a condition caused by changes that occur in the spinal disks as a person ages.  Degenerative disk disease can affect the whole spine. However, the neck and lower back are most often affected.  Take over-the-counter and  prescription medicines only as told by your health care provider. This information is not intended to replace advice given to you by your health care provider. Make sure you discuss any questions you have with your health care provider. Document Revised: 08/07/2017 Document Reviewed: 08/07/2017 Elsevier Patient Education  2020 [place].  Lumbosacral Radiculopathy Lumbosacral radiculopathy is a condition that involves the spinal nerves and nerve roots in the low back and bottom of the spine. The condition develops when these nerves and nerve roots move out of place or become inflamed and cause symptoms. What are the causes? This condition may be caused by:  Pressure from a disk that bulges out of place (herniated disk). A disk is a plate of soft cartilage that separates bones in the spine.  Disk changes that occur with age (disk degeneration).  A narrowing of the bones of the lower back (spinal stenosis).  A tumor.  An infection.  An injury that places sudden pressure on the disks that  cushion the bones of your lower spine. What increases the risk? You are more likely to develop this condition if:  You are a male who is 63-60 years old.  You are a male who is 89-30 years old.  You use improper technique when lifting things.  You are overweight or live a sedentary lifestyle.  Your work requires frequent lifting.  You smoke.  You do repetitive activities that strain the spine. What are the signs or symptoms? Symptoms of this condition include:  Pain that goes down from your back into your legs (sciatica), usually on one side of the body. This is the most common symptom. The pain may be worse with sitting, coughing, or sneezing.  Pain and numbness in your legs.  Muscle weakness.  Tingling.  Loss of bladder control or bowel control. How is this diagnosed? This condition may be diagnosed based on:  Your symptoms and medical history.  A physical exam. If the  pain is lasting, you may have tests, such as:  MRI scan.  X-ray.  CT scan.  A type of X-ray used to examine the spinal canal after injecting a dye into your spine (myelogram).  A test to measure how electrical impulses move through a nerve (nerve conduction study). How is this treated? Treatment may depend on the cause of the condition and may include:  Working with a physical therapist.  Taking pain medicine.  Applying heat and ice to affected areas.  Doing stretches to improve flexibility.  Doing exercises to strengthen back muscles.  Having chiropractic spinal manipulation.  Using transcutaneous electrical nerve stimulation (TENS) therapy.  Getting a steroid injection in the spine. In some cases, no treatment is needed. If the condition is long-lasting (chronic), or if symptoms are severe, treatment may involve surgery or lifestyle changes, such as following a weight-loss plan. Follow these instructions at home: Activity  Avoid bending and other activities that make the problem worse.  Maintain a proper position when standing or sitting: ? When standing, keep your upper back and neck straight, with your shoulders pulled back. Avoid slouching. ? When sitting, keep your back straight and relax your shoulders. Do not round your shoulders or pull them backward.  Do not sit or stand in one place for long periods of time.  Take brief periods of rest throughout the day. This will reduce your pain. It is usually better to rest by lying down or standing, not sitting.  When you are resting for longer periods, mix in some mild activity or stretching between periods of rest. This will help to prevent stiffness and pain.  Get regular exercise. Ask your health care provider what activities are safe for you. If you were shown how to do any exercises or stretches, do them as directed by your health care provider.  Do not lift anything that is heavier than 10 lb (4.5 kg) or the limit  that you are told by your health care provider. Always use proper lifting technique, which includes: ? Bending your knees. ? Keeping the load close to your body. ? Avoiding twisting. Managing pain  If directed, put ice on the affected area: ? Put ice in a plastic bag. ? Place a towel between your skin and the bag. ? Leave the ice on for 20 minutes, 2-3 times a day.  If directed, apply heat to the affected area as often as told by your health care provider. Use the heat source that your health care provider recommends, such as a  moist heat pack or a heating pad. ? Place a towel between your skin and the heat source. ? Leave the heat on for 20-30 minutes. ? Remove the heat if your skin turns bright red. This is especially important if you are unable to feel pain, heat, or cold. You may have a greater risk of getting burned.  Take over-the-counter and prescription medicines only as told by your health care provider. General instructions  Sleep on a firm mattress in a comfortable position. Try lying on your side with your knees slightly bent. If you lie on your back, put a pillow under your knees.  Do not drive or use heavy machinery while taking prescription pain medicine.  If your health care provider prescribed a diet or exercise program, follow it as directed.  Keep all follow-up visits as told by your health care provider. This is important. Contact a health care provider if:  Your pain does not improve over time, even when taking pain medicines. Get help right away if:  You develop severe pain.  Your pain suddenly gets worse.  You develop increasing weakness in your legs.  You lose the ability to control your bladder or bowel.  You have difficulty walking or balancing.  You have a fever. Summary  Lumbosacral radiculopathy is a condition that occurs when the spinal nerves and nerve roots in the lower part of the spine move out of place or become inflamed and cause  symptoms.  Symptoms include pain, numbness, and tingling that go down from your back into your legs (sciatica), muscle weakness, and loss of bladder control or bowel control.  If directed, apply ice or heat to the affected area as told by your health care provider.  Follow instructions about activity, rest, and proper lifting technique. This information is not intended to replace advice given to you by your health care provider. Make sure you discuss any questions you have with your health care provider. Document Revised: 07/31/2017 Document Reviewed: 07/31/2017 Elsevier Patient Education  [place].

## 2020-06-29 ENCOUNTER — Encounter: Payer: Self-pay | Admitting: Orthopedic Surgery

## 2020-06-29 ENCOUNTER — Ambulatory Visit: Payer: Medicare Other

## 2020-06-29 ENCOUNTER — Other Ambulatory Visit: Payer: Self-pay

## 2020-06-29 ENCOUNTER — Ambulatory Visit: Payer: Medicare Other | Admitting: Orthopedic Surgery

## 2020-06-29 VITALS — BP 147/80 | HR 77 | Ht 66.0 in | Wt 176.0 lb

## 2020-06-29 DIAGNOSIS — M5410 Radiculopathy, site unspecified: Secondary | ICD-10-CM

## 2020-06-29 DIAGNOSIS — Z9889 Other specified postprocedural states: Secondary | ICD-10-CM

## 2020-06-29 DIAGNOSIS — Z8781 Personal history of (healed) traumatic fracture: Secondary | ICD-10-CM

## 2020-06-29 DIAGNOSIS — S72001D Fracture of unspecified part of neck of right femur, subsequent encounter for closed fracture with routine healing: Secondary | ICD-10-CM | POA: Diagnosis not present

## 2020-06-29 MED ORDER — METHYLPREDNISOLONE ACETATE 40 MG/ML IJ SUSP
40.0000 mg | Freq: Once | INTRAMUSCULAR | Status: AC
  Administered 2020-06-29: 40 mg via INTRAMUSCULAR

## 2020-06-29 NOTE — Addendum Note (Signed)
Addended byCandice Camp on: 06/29/2020 03:43 PM   Modules accepted: Orders

## 2020-06-29 NOTE — Progress Notes (Signed)
Chief Complaint  Patient presents with  . Leg Pain    right leg pain/ steroid helped but pain has returned in past 2 weeks    79 year old male right leg pain had ORIF with a cannulated hip screw for femoral neck fracture did well but developed postoperative radiculopathy and back pain  We put him on Robaxin and then a Dosepak for 12 days.  His back pain improved his right leg is still numb pain still radiates  Red flags he is over 70, pain greater than 6 weeks history of cancer in the prostate  Past Medical History:  Diagnosis Date  . GERD (gastroesophageal reflux disease)   . Hx of adenomatous colonic polyps   . Hypertension   . Prostate cancer ([place])   . Umbilical hernia with obstruction      Recommend IM shot of Depo-Medrol  Nurse gave 40 mg Depo-Medrol right hip  MRI lumbar spine rule out cancer  If degenerative disc disease injections will be ordered

## 2020-06-29 NOTE — Patient Instructions (Addendum)
MRI BACK / does not require authorization, you can go ahead and call to schedule the MRI scan at [facility], the number is [phone]

## 2020-07-12 ENCOUNTER — Other Ambulatory Visit: Payer: Self-pay

## 2020-07-12 ENCOUNTER — Ambulatory Visit (HOSPITAL_COMMUNITY)
Admission: RE | Admit: 2020-07-12 | Discharge: 2020-07-12 | Disposition: A | Discharge status: Home / Self Care | Payer: Medicare Other | Source: Ambulatory Visit | Attending: Orthopedic Surgery | Admitting: Orthopedic Surgery

## 2020-07-12 DIAGNOSIS — M545 Low back pain, unspecified: Secondary | ICD-10-CM | POA: Diagnosis not present

## 2020-07-12 DIAGNOSIS — M5410 Radiculopathy, site unspecified: Secondary | ICD-10-CM | POA: Diagnosis not present

## 2020-07-13 ENCOUNTER — Telehealth: Payer: Self-pay | Admitting: Orthopedic Surgery

## 2020-07-13 ENCOUNTER — Ambulatory Visit: Payer: Medicare Other | Admitting: Orthopedic Surgery

## 2020-07-13 ENCOUNTER — Encounter: Payer: Self-pay | Admitting: Orthopedic Surgery

## 2020-07-13 VITALS — BP 141/89 | HR 82 | Ht 66.0 in | Wt 176.0 lb

## 2020-07-13 DIAGNOSIS — M48061 Spinal stenosis, lumbar region without neurogenic claudication: Secondary | ICD-10-CM | POA: Diagnosis not present

## 2020-07-13 DIAGNOSIS — C61 Malignant neoplasm of prostate: Secondary | ICD-10-CM | POA: Diagnosis not present

## 2020-07-13 MED ORDER — TRAMADOL-ACETAMINOPHEN 37.5-325 MG PO TABS: 1.0000 | ORAL_TABLET | ORAL | 0 refills | Status: AC | PRN

## 2020-07-13 NOTE — Patient Instructions (Addendum)
Driving Directions to [employer] from [employer] address is [place] The phone number is [phone]  1. Start out going [place] on [address] toward [address].  Then 0.02 miles0.02 total miles 2. Take the 1st right onto [address]/[place]. Continue to follow [address].  If you reach [facility] you've gone a little too far  Then 0.58 miles0.60 total miles 3. Turn right onto [address] is just past [facility]  Then 2.25 miles2.85 total miles 4. Take the [address] ramp toward [place].  Then 0.25 miles3.10 total miles 5. Merge onto [address]  Then 18.17 miles21.28 total miles 6. Merge onto [address]  Then 1.47 miles22.74 total miles 7. Turn right onto [address] is just past [place]  Then 0.11 miles22.85 total miles  8. [address], [place], [place] [id number], [place] is on the left. Epidural Steroid Injection An epidural steroid injection is a shot of steroid medicine and numbing medicine that is given into the space between the spinal cord and the bones of the back (epidural space). The shot helps relieve pain caused by an irritated or swollen nerve root. The amount of pain relief you get from the injection depends on what is causing the nerve to be swollen and irritated, and how long your pain lasts. You are more likely to benefit from this injection if your pain is strong and comes on suddenly rather than if you have had long-term (chronic) pain. Tell a health care provider about:  Any allergies you have.  All medicines you are taking, including vitamins, herbs, eye drops, creams, and over-the-counter medicines.  Any problems you or family members have had with anesthetic medicines.  Any blood disorders you have.  Any surgeries you have had.  Any medical conditions you have.  Whether you are pregnant or may be  pregnant. What are the risks? Generally, this is a safe procedure. However, problems may occur, including:  Headache.  Bleeding.  Infection.  Allergic reaction to medicines.  Nerve damage. What happens before the procedure? Staying hydrated Follow instructions from your health care provider about hydration, which may include:  Up to 2 hours before the procedure - you may continue to drink clear liquids, such as water, clear fruit juice, black coffee, and plain tea. Eating and drinking restrictions Follow instructions from your health care provider about eating and drinking, which may include:  8 hours before the procedure - stop eating heavy meals or foods, such as meat, fried foods, or fatty foods.  6 hours before the procedure - stop eating light meals or foods, such as toast or cereal.  6 hours before the procedure - stop drinking milk or drinks that contain milk.  2 hours before the procedure - stop drinking clear liquids. Medicines  You may be given medicines to lower anxiety.  Ask your health care provider about: ? Changing or stopping your regular medicines. This is especially important if you are taking diabetes medicines or blood thinners. ? Taking medicines such as aspirin and ibuprofen. These medicines can thin your blood. Do not take these medicines unless your health care provider tells you to take them. ? Taking over-the-counter medicines, vitamins, herbs, and supplements.  Ask your health care provider what steps will be taken to prevent infection. General instructions  Plan to have someone take you home from the  hospital or clinic.  If you will be going home right after the procedure, plan to have someone with you for 24 hours. What happens during the procedure?  An IV will be inserted into one of your veins.  You will be given one or more of the following: ? A medicine to help you relax (sedative). ? A medicine to numb the area (local  anesthetic).  You will be asked to lie on your abdomen or sit.  The injection site will be cleaned.  A needle will be inserted through your skin into the epidural space. This may cause you some discomfort. An X-ray machine will be used to guide the needle as close as possible to the affected nerve.  A steroid medicine and a local anesthetic will be injected into the epidural space.  The needle and IV will be removed.  A bandage (dressing) will be put over the injection site. The procedure may vary among health care providers and hospitals. What can I expect after the procedure? Follow these instructions at home: Injection site care  You may remove the bandage (dressing) after 24 hours.  Check your injection site every day for signs of infection. Check for: ? Redness, swelling, or pain. ? Fluid or blood. ? Warmth. ? Pus or a bad smell. Managing pain, stiffness, and swelling  For 24 hours after the procedure: ? Avoid using heat on the injection site. ? Do not take baths, swim, or use a hot tub until your health care provider approves. Ask your health care provider if you may take a shower. You may only be allowed to take sponge baths.  If directed, put ice on the injection site. To do this: ? Put ice in a plastic bag. ? Place a towel between your skin and the bag. ? Leave the ice on for 20 minutes, 2-3 times a day.  Activity  Do not drive for 24 hours if you were given a sedative during your procedure.  Return to your normal activities as told by your health care provider. Ask your health care provider what activities are safe for you. General instructions  Your blood pressure, heart rate, breathing rate, and blood oxygen level will be monitored until you leave the hospital or clinic.  Your arm or leg may feel weak or numb for a few hours.  The injection site may feel sore.  Take over-the-counter and prescription medicines only as told by your health care  provider.  Drink enough fluid to keep your urine pale yellow.  Keep all follow-up visits as told by your health care provider. This is important. Contact a health care provider if:  You have any of these signs of infection: ? Redness, swelling, or pain around your injection site. ? Fluid or blood coming from your injection site. ? Warmth coming from your injection site. ? Pus or a bad smell coming from your injection site. ? A fever.  You continue to have pain and soreness around the injection site, even after taking over-the-counter pain medicine.  You have severe, sudden, or lasting nausea or vomiting. Get help right away if:  You have severe pain at the injection site that is not relieved by medicines.  You develop a severe headache or a stiff neck.  You become sensitive to light.  You have any new numbness or weakness in your legs or arms.  You lose control of your bladder or bowel movements.  You have trouble breathing. Summary  An epidural steroid injection  is a shot of steroid medicine and numbing medicine that is given into the epidural space.  The shot helps relieve pain caused by an irritated or swollen nerve root.  You are more likely to benefit from this injection if your pain is strong and comes on suddenly rather than if you have had chronic pain. This information is not intended to replace advice given to you by your health care provider. Make sure you discuss any questions you have with your health care provider. Document Revised: 02/22/2019 Document Reviewed: 02/22/2019 Elsevier Patient Education  [place].

## 2020-07-13 NOTE — Telephone Encounter (Signed)
Call received from [facility], ph# [phone], asking if patient may receive Tramadol instead of Ultracet, as they cannot get Ultracet  Prescription from today, 07/13/20:  "traMADol-acetaminophen (ULTRACET) 37.5-325 MG tablet"

## 2020-07-13 NOTE — Addendum Note (Signed)
Addended by: Carole Civil on: 07/13/2020 10:23 AM   Modules accepted: Orders

## 2020-07-13 NOTE — Progress Notes (Addendum)
Chief Complaint  Patient presents with  . Back Pain  . Results    review Lumbar MRI     Encounter Diagnoses  Name Primary?  . Degenerative lumbar spinal stenosis Yes  . Malignant neoplasm of prostate ([place])     MRI follow-up lumbar spine  Alejandro Krueger his MRI shows that he has a shallow disc protrusion at L4-5 and a L3-4 disc bulge with some facet and ligament hypertrophy he has bilateral lateral recess stenosis at L3-4 and lateral recess stenosis at L4-5  MRI report was read as follows    L3-4: Mild bulging of the disc. Mild facet and ligamentous prominence. Mild bilateral lateral recess stenosis but without visible neural compression.   L4-5: Shallow disc protrusion. Annular fissures with annular bulging in both foramina, right more than left. Mild facet osteoarthritis. Mild stenosis of both lateral recesses. Neural compression is not demonstrated, but there would be potential cause right-sided neural irritation based on the annular fissures.   L5-S1: No disc abnormality.   IMPRESSION: 1. At L4-5, there is a shallow disc protrusion. Annular fissures with annular bulging in both foramina, right more than left. There is mild stenosis of both lateral recesses. Neural compression is not demonstrated, but there would be potential for right-sided neural irritation based on the annular fissures. 2. L3-4: Disc bulge. Mild facet and ligamentous prominence. Mild bilateral lateral recess stenosis but without visible neural compression.     Electronically Signed   By: Nelson Chimes M.D.   On: 07/12/2020 13:30   Recommend patient see neurosurgery for definitive management or ESI   After discussion he wants to try injections ESI   Meds ordered this encounter  Medications  . traMADol-acetaminophen (ULTRACET) 37.5-325 MG tablet    Sig: Take 1 tablet by mouth every 4 (four) hours as needed for up to 7 days.    Dispense:  42 tablet    Refill:  0

## 2020-07-13 NOTE — Addendum Note (Signed)
Addended byCandice Camp on: 07/13/2020 10:17 AM   Modules accepted: Orders

## 2020-07-14 ENCOUNTER — Other Ambulatory Visit: Payer: Self-pay | Admitting: Orthopedic Surgery

## 2020-07-14 DIAGNOSIS — M48061 Spinal stenosis, lumbar region without neurogenic claudication: Secondary | ICD-10-CM

## 2020-07-14 MED ORDER — TRAMADOL HCL 50 MG PO TABS: 50.0000 mg | ORAL_TABLET | Freq: Four times a day (QID) | ORAL | 0 refills | Status: AC | PRN

## 2020-07-14 NOTE — Progress Notes (Signed)
Meds ordered this encounter  Medications  . traMADol (ULTRAM) 50 MG tablet    Sig: Take 1 tablet (50 mg total) by mouth every 6 (six) hours as needed for up to 7 days.    Dispense:  28 tablet    Refill:  0

## 2020-08-08 ENCOUNTER — Encounter: Payer: Self-pay | Admitting: Physical Medicine and Rehabilitation

## 2020-08-08 ENCOUNTER — Ambulatory Visit (INDEPENDENT_AMBULATORY_CARE_PROVIDER_SITE_OTHER): Payer: Medicare Other | Admitting: Physical Medicine and Rehabilitation

## 2020-08-08 ENCOUNTER — Other Ambulatory Visit: Payer: Self-pay

## 2020-08-08 ENCOUNTER — Ambulatory Visit: Payer: Self-pay

## 2020-08-08 VITALS — BP 142/81 | HR 70

## 2020-08-08 DIAGNOSIS — M5416 Radiculopathy, lumbar region: Secondary | ICD-10-CM | POA: Diagnosis not present

## 2020-08-08 MED ORDER — DEXAMETHASONE SODIUM PHOSPHATE 10 MG/ML IJ SOLN
15.0000 mg | Freq: Once | INTRAMUSCULAR | Status: AC
  Administered 2020-08-08: 14:00:00 15 mg

## 2020-08-08 NOTE — Progress Notes (Signed)
Pt state pain in both legs mostly his right leg. Pt state the leg goes numb at times. Pt mention falling and breaking his right hip in march. Pt state sitting in a car makes the pain worse. Pt state his done with pain meds and just deals with the pain.  Numeric Pain Rating Scale and Functional Assessment Average Pain 5   In the last MONTH (on 0-10 scale) has pain interfered with the following?  1. General activity like being  able to carry out your everyday physical activities such as walking, climbing stairs, carrying groceries, or moving a chair?  Rating(5)   +Driver, -BT, -Dye Allergies.

## 2020-08-09 DIAGNOSIS — I499 Cardiac arrhythmia, unspecified: Secondary | ICD-10-CM | POA: Diagnosis not present

## 2020-08-09 DIAGNOSIS — R7301 Impaired fasting glucose: Secondary | ICD-10-CM | POA: Diagnosis not present

## 2020-08-09 DIAGNOSIS — I10 Essential (primary) hypertension: Secondary | ICD-10-CM | POA: Diagnosis not present

## 2020-08-09 DIAGNOSIS — E782 Mixed hyperlipidemia: Secondary | ICD-10-CM | POA: Diagnosis not present

## 2020-08-09 DIAGNOSIS — B0222 Postherpetic trigeminal neuralgia: Secondary | ICD-10-CM | POA: Diagnosis not present

## 2020-08-09 DIAGNOSIS — K219 Gastro-esophageal reflux disease without esophagitis: Secondary | ICD-10-CM | POA: Diagnosis not present

## 2020-08-14 DIAGNOSIS — Z23 Encounter for immunization: Secondary | ICD-10-CM | POA: Diagnosis not present

## 2020-08-14 DIAGNOSIS — E1122 Type 2 diabetes mellitus with diabetic chronic kidney disease: Secondary | ICD-10-CM | POA: Diagnosis not present

## 2020-08-14 DIAGNOSIS — N1831 Chronic kidney disease, stage 3a: Secondary | ICD-10-CM | POA: Diagnosis not present

## 2020-08-14 DIAGNOSIS — I10 Essential (primary) hypertension: Secondary | ICD-10-CM | POA: Diagnosis not present

## 2020-08-14 DIAGNOSIS — R944 Abnormal results of kidney function studies: Secondary | ICD-10-CM | POA: Diagnosis not present

## 2020-09-07 ENCOUNTER — Ambulatory Visit: Payer: Medicare Other | Admitting: Orthopedic Surgery

## 2020-09-07 ENCOUNTER — Other Ambulatory Visit: Payer: Self-pay

## 2020-09-07 ENCOUNTER — Encounter: Payer: Self-pay | Admitting: Orthopedic Surgery

## 2020-09-07 VITALS — BP 166/90 | HR 78 | Ht 66.0 in | Wt 176.0 lb

## 2020-09-07 DIAGNOSIS — M48061 Spinal stenosis, lumbar region without neurogenic claudication: Secondary | ICD-10-CM | POA: Diagnosis not present

## 2020-09-07 DIAGNOSIS — S72001D Fracture of unspecified part of neck of right femur, subsequent encounter for closed fracture with routine healing: Secondary | ICD-10-CM | POA: Diagnosis not present

## 2020-09-07 NOTE — Progress Notes (Signed)
FOLLOW-UP OFFICE VISIT   Encounter Diagnoses  Name Primary?  . Degenerative lumbar spinal stenosis Yes  . Closed fracture of neck of right femur with routine healing, subsequent encounter     80 year old male status post epidural injection x1 for radicular pain left leg.  Patient says his shot gave him excellent relief  He still complains of pain over his right hip status post open reduction internal fixation with cannulated screws  As noted on prior x-rays the screw heads are prominent.  He does have complaints of popping over the lateral side of the right hip near the greater trochanter  (and prior treatment)  + EXAM FINDINGS: Normal range of motion the left hip and left leg with tenderness pain and popping near the greater trochanter of the right leg  He has a slight limp favoring the right lower extremity ASSESSMENT AND PLAN Improved pain from degenerative lumbar spinal stenosis and left lower extremity radiculopathy after injection  Fracture right hip status post cannulated screws with pain over the screw heads  I will see him in March to plan for removal of those screws as they are symptomatic

## 2020-10-08 NOTE — Progress Notes (Signed)
Alejandro Krueger - 80 y.o. male MRN [id number]  Date of birth: 1940/09/14  Office Visit Note: Visit Date: 08/08/2020 PCP: Celene Squibb, MD Referred by: Celene Squibb, MD  Subjective: Chief Complaint  Patient presents with  . Left Leg - Pain  . Right Leg - Numbness, Pain   HPI:  Alejandro Krueger is a 80 y.o. male who comes in today at the request of Dr. Arther Abbott for planned Right L4-L5 Lumbar epidural steroid injection with fluoroscopic guidance.  The patient has failed conservative care including home exercise, medications, time and activity modification.  This injection will be diagnostic and hopefully therapeutic.  Please see requesting physician notes for further details and justification.  MRI reviewed with images and spine model.  MRI reviewed in the note below.   ROS Otherwise per HPI.  Assessment & Plan: Visit Diagnoses:    ICD-10-CM   1. Lumbar radiculopathy  M54.16 XR C-ARM NO REPORT    Epidural Steroid injection    dexamethasone (DECADRON) injection 15 mg    Plan: No additional findings.   Meds & Orders:  Meds ordered this encounter  Medications  . dexamethasone (DECADRON) injection 15 mg    Orders Placed This Encounter  Procedures  . XR C-ARM NO REPORT  . Epidural Steroid injection    Follow-up: Return if symptoms worsen or fail to improve, for Consider L5 transforaminal..   Procedures: No procedures performed  Lumbosacral Transforaminal Epidural Steroid Injection - Sub-Pedicular Approach with Fluoroscopic Guidance  Patient: Alejandro Krueger      Date of Birth: [birth year] MRN: [id number] PCP: Celene Squibb, MD      Visit Date: 08/08/2020   Universal Protocol:    Date/Time: 08/08/2020  Consent Given By: the patient  Position: PRONE  Additional Comments: Vital signs were monitored before and after the procedure. Patient was prepped and draped in the usual sterile fashion. The correct patient, procedure, and site was verified.   Injection  Procedure Details:   Procedure diagnoses: Lumbar radiculopathy [M54.16]    Meds Administered:  Meds ordered this encounter  Medications  . dexamethasone (DECADRON) injection 15 mg    Laterality: Right  Location/Site:  L4-L5  Needle:5.0 in., 22 ga.  Short bevel or Quincke spinal needle  Needle Placement: Transforaminal  Findings:    -Comments: Excellent flow of contrast along the nerve, nerve root and into the epidural space.  Procedure Details: After squaring off the end-plates to get a true AP view, the C-arm was positioned so that an oblique view of the foramen as noted above was visualized. The target area is just inferior to the "nose of the scotty dog" or sub pedicular. The soft tissues overlying this structure were infiltrated with 2-3 ml. of 1% Lidocaine without Epinephrine.  The spinal needle was inserted toward the target using a "trajectory" view along the fluoroscope beam.  Under AP and lateral visualization, the needle was advanced so it did not puncture dura and was located close the 6 O'Clock position of the pedical in AP tracterory. Biplanar projections were used to confirm position. Aspiration was confirmed to be negative for CSF and/or blood. A 1-2 ml. volume of Isovue-250 was injected and flow of contrast was noted at each level. Radiographs were obtained for documentation purposes.   After attaining the desired flow of contrast documented above, a 0.5 to 1.0 ml test dose of 0.25% Marcaine was injected into each respective transforaminal space.  The patient was observed for 90 seconds post  injection.  After no sensory deficits were reported, and normal lower extremity motor function was noted,   the above injectate was administered so that equal amounts of the injectate were placed at each foramen (level) into the transforaminal epidural space.   Additional Comments:  The patient tolerated the procedure well Dressing: 2 x 2 sterile gauze and Band-Aid     Post-procedure details: Patient was observed during the procedure. Post-procedure instructions were reviewed.  Patient left the clinic in stable condition.      Clinical History: MRI LUMBAR SPINE WITHOUT CONTRAST  TECHNIQUE: Multiplanar, multisequence MR imaging of the lumbar spine was performed. No intravenous contrast was administered.  COMPARISON:  Radiography 06/29/2020  FINDINGS: Segmentation:  5 lumbar type vertebral bodies.  Alignment: Straightening of the normal lumbar lordosis. Mild curvature convex to the left.  Vertebrae:  No fracture or primary bone lesion.  Conus medullaris and cauda equina: Conus extends to the T12-L1 level. Conus and cauda equina appear normal.  Paraspinal and other soft tissues: Negative except for aortic atherosclerosis.  Disc levels:  T12-L1 and L1-2: Negative  L2-3: Minimal disc bulge without compressive stenosis.  L3-4: Mild bulging of the disc. Mild facet and ligamentous prominence. Mild bilateral lateral recess stenosis but without visible neural compression.  L4-5: Shallow disc protrusion. Annular fissures with annular bulging in both foramina, right more than left. Mild facet osteoarthritis. Mild stenosis of both lateral recesses. Neural compression is not demonstrated, but there would be potential cause right-sided neural irritation based on the annular fissures.  L5-S1: No disc abnormality.  IMPRESSION: 1. At L4-5, there is a shallow disc protrusion. Annular fissures with annular bulging in both foramina, right more than left. There is mild stenosis of both lateral recesses. Neural compression is not demonstrated, but there would be potential for right-sided neural irritation based on the annular fissures. 2. L3-4: Disc bulge. Mild facet and ligamentous prominence. Mild bilateral lateral recess stenosis but without visible neural compression.   Electronically Signed   By: Nelson Chimes M.D.    On: 07/12/2020 13:30     Objective:  VS:  HT:    WT:   BMI:     BP:(!) 142/81  HR:70bpm  TEMP: ( )  RESP:  Physical Exam Vitals and nursing note reviewed.  Constitutional:      General: He is not in acute distress.    Appearance: Normal appearance. He is not ill-appearing.  HENT:     Head: Normocephalic and atraumatic.     Right Ear: External ear normal.     Left Ear: External ear normal.     Nose: No congestion.  Eyes:     Extraocular Movements: Extraocular movements intact.  Cardiovascular:     Rate and Rhythm: Normal rate.     Pulses: Normal pulses.  Pulmonary:     Effort: Pulmonary effort is normal. No respiratory distress.  Abdominal:     General: There is no distension.     Palpations: Abdomen is soft.  Musculoskeletal:        General: No tenderness or signs of injury.     Cervical back: Neck supple.     Right lower leg: No edema.     Left lower leg: No edema.     Comments: Patient has good distal strength without clonus.  Skin:    Findings: No erythema or rash.  Neurological:     General: No focal deficit present.     Mental Status: He is alert and oriented to person,  place, and time.     Sensory: No sensory deficit.     Motor: No weakness or abnormal muscle tone.     Coordination: Coordination normal.  Psychiatric:        Mood and Affect: Mood normal.        Behavior: Behavior normal.      Imaging: No results found.

## 2020-10-08 NOTE — Procedures (Signed)
Lumbosacral Transforaminal Epidural Steroid Injection - Sub-Pedicular Approach with Fluoroscopic Guidance  Patient: Alejandro Krueger      Date of Birth: [birth year] MRN: [id number] PCP: Celene Squibb, MD      Visit Date: 08/08/2020   Universal Protocol:    Date/Time: 08/08/2020  Consent Given By: the patient  Position: PRONE  Additional Comments: Vital signs were monitored before and after the procedure. Patient was prepped and draped in the usual sterile fashion. The correct patient, procedure, and site was verified.   Injection Procedure Details:   Procedure diagnoses: Lumbar radiculopathy [M54.16]    Meds Administered:  Meds ordered this encounter  Medications  . dexamethasone (DECADRON) injection 15 mg    Laterality: Right  Location/Site:  L4-L5  Needle:5.0 in., 22 ga.  Short bevel or Quincke spinal needle  Needle Placement: Transforaminal  Findings:    -Comments: Excellent flow of contrast along the nerve, nerve root and into the epidural space.  Procedure Details: After squaring off the end-plates to get a true AP view, the C-arm was positioned so that an oblique view of the foramen as noted above was visualized. The target area is just inferior to the "nose of the scotty dog" or sub pedicular. The soft tissues overlying this structure were infiltrated with 2-3 ml. of 1% Lidocaine without Epinephrine.  The spinal needle was inserted toward the target using a "trajectory" view along the fluoroscope beam.  Under AP and lateral visualization, the needle was advanced so it did not puncture dura and was located close the 6 O'Clock position of the pedical in AP tracterory. Biplanar projections were used to confirm position. Aspiration was confirmed to be negative for CSF and/or blood. A 1-2 ml. volume of Isovue-250 was injected and flow of contrast was noted at each level. Radiographs were obtained for documentation purposes.   After attaining the desired flow of contrast  documented above, a 0.5 to 1.0 ml test dose of 0.25% Marcaine was injected into each respective transforaminal space.  The patient was observed for 90 seconds post injection.  After no sensory deficits were reported, and normal lower extremity motor function was noted,   the above injectate was administered so that equal amounts of the injectate were placed at each foramen (level) into the transforaminal epidural space.   Additional Comments:  The patient tolerated the procedure well Dressing: 2 x 2 sterile gauze and Band-Aid    Post-procedure details: Patient was observed during the procedure. Post-procedure instructions were reviewed.  Patient left the clinic in stable condition.

## 2020-11-06 ENCOUNTER — Other Ambulatory Visit: Payer: Self-pay

## 2020-11-06 ENCOUNTER — Telehealth: Payer: Self-pay | Admitting: Radiology

## 2020-11-06 ENCOUNTER — Ambulatory Visit: Payer: Medicare Other | Admitting: Orthopedic Surgery

## 2020-11-06 ENCOUNTER — Ambulatory Visit: Payer: Medicare Other

## 2020-11-06 ENCOUNTER — Encounter: Payer: Self-pay | Admitting: Orthopedic Surgery

## 2020-11-06 VITALS — BP 135/69 | HR 75 | Ht 66.0 in | Wt 179.1 lb

## 2020-11-06 DIAGNOSIS — T8484XA Pain due to internal orthopedic prosthetic devices, implants and grafts, initial encounter: Secondary | ICD-10-CM

## 2020-11-06 DIAGNOSIS — Z8781 Personal history of (healed) traumatic fracture: Secondary | ICD-10-CM

## 2020-11-06 DIAGNOSIS — Z9889 Other specified postprocedural states: Secondary | ICD-10-CM | POA: Diagnosis not present

## 2020-11-06 NOTE — Telephone Encounter (Signed)
  Notification or Prior Authorization is not required for the requested services  Decision ID #:[id number]  For hardware removal.

## 2020-11-06 NOTE — Patient Instructions (Signed)
Remove screws right hip

## 2020-11-06 NOTE — Progress Notes (Signed)
Chief Complaint  Patient presents with  . Hip Pain    R/ its been hurting for awhile now, but the last two weeks have been bad.     Status post ORIF right hip March 2021 Patient had some right hip pain we believe is secondary to to the screws backing out Comes in today for preop repeat x-ray preparation for removal of hardware  Note also had epidural injection for lumbar spinal stenosis  Review of Systems  Respiratory: Negative for shortness of breath.   Cardiovascular: Negative for chest pain.  Musculoskeletal: Positive for back pain.  Neurological: Tingling: .pmh.psh.   Past Surgical History:  Procedure Laterality Date  . COLONOSCOPY  06/23/2007   [id number] polyps removed (cecum and hepatic flexure). The remainder of the colonic mucosa appeared normal/Left-sided diverticula/Normal rectum (tubular adenomas)  . COLONOSCOPY N/A 01/05/2014   Procedure: COLONOSCOPY;  Surgeon: Daneil Dolin, MD;  Location: [facility];  Service: Endoscopy;  Laterality: N/A;  9:30  . ESOPHAGOGASTRODUODENOSCOPY N/A 01/05/2014   Procedure: ESOPHAGOGASTRODUODENOSCOPY (EGD);  Surgeon: Daneil Dolin, MD;  Location: [facility];  Service: Endoscopy;  Laterality: N/A;  . HIP PINNING,CANNULATED Right 11/09/2019   Procedure: CANNULATED HIP PINNING;  Surgeon: Carole Civil, MD;  Location: [facility];  Service: Orthopedics;  Laterality: Right;  . KNEE ARTHROSCOPY  2003   right  . prostate biopsy  2016  . RADIOACTIVE SEED IMPLANT N/A 06/02/2015   Procedure: RADIOACTIVE SEED IMPLANT/BRACHYTHERAPY IMPLANT;  Surgeon: Festus Aloe, MD;  [facility];  Service: Urology;  Laterality: N/A;  63 seeds implanted no seeds in bladder  . UMBILICAL HERNIA REPAIR N/A 02/12/2017   Procedure: HERNIA REPAIR UMBILICAL ADULT WITH MESH;  Surgeon: Aviva Signs, MD;  Location: [facility];  Service: General;  Laterality: N/A;   Past Medical History:  Diagnosis Date  . GERD (gastroesophageal reflux  disease)   . Hx of adenomatous colonic polyps   . Hypertension   . Prostate cancer ([place])   . Umbilical hernia with obstruction    BP 135/69   Pulse 75   Ht 5\' 6"  (1.676 m)   Wt 179 lb 2 oz (81.3 kg)   BMI 28.91 kg/m   Physical Exam  Well-healed incision right hip Tenderness over the hardware  Hip range of motion looks good May have slight shortening of the right leg  Vascular exam is intact  Tone is normal  Ongoing tenderness lumbar spine  X-rays 3 screws 3 washers to the screws are backing out there partially-threaded screws that are titanium  The procedure has been fully reviewed with the patient; The risks and benefits of surgery have been discussed and explained and understood. Alternative treatment has also been reviewed, questions were encouraged and answered. The postoperative plan is also been reviewed.    hwr rt hip

## 2020-11-14 NOTE — Patient Instructions (Signed)
Alejandro Krueger  11/14/2020     @PREFPERIOPPHARMACY @   Your procedure is scheduled on  11/17/2020   Report to [facility] at  [email or web address].   Call this number if you have problems the morning of surgery:  [phone]   Remember:  Do not eat or drink after midnight.                        Take these medicines the morning of surgery with A SIP OF WATER  Amlodipine, wellbutrin, prilosec.     Place clean sheets on your bed the night before your surgery. DO NOT sleep with pets this night.  Shower with CHG the night before and the morning of your procedure. DO NOT put CHG on your face, hair or genitals.  After each shower, dry of with a clean towel, put on clean comfortable clothes and brush your teeth.      Do not wear jewelry, make-up or nail polish.  Do not wear lotions, powders, or perfumes, or deodorant.  Do not shave 48 hours prior to surgery.  Men may shave face and neck.  Do not bring valuables to the hospital.  [facility] is not responsible for any belongings or valuables.  Contacts, dentures or bridgework may not be worn into surgery.  Leave your suitcase in the car.  After surgery it may be brought to your room.  For patients admitted to the hospital, discharge time will be determined by your treatment team.  Patients discharged the day of surgery will not be allowed to drive home and must have someone with them for 24 hours.   Special instructions:  DO NOT smoke tobacco or vape the morning of your procedure.  Please read over the following fact sheets that you were given. Coughing and Deep Breathing, Surgical Site Infection Prevention, Anesthesia Post-op Instructions and Care and Recovery After Surgery       Orthopedic Hardware Removal, Care After This sheet gives you information about how to care for yourself after your procedure. Your health care provider may also give you more specific instructions. If you have problems or questions, contact your  health care provider. What can I expect after the procedure? After the procedure, it is common to have:  Soreness or pain.  Some swelling in the area where the hardware was removed.  A small amount of blood or clear fluid coming from your incision. Follow these instructions at home: If you have a cast:  Do not stick anything inside the cast to scratch your skin. Doing that increases your risk of infection.  Check the skin around the cast every day. Tell your health care provider about any concerns.  You may put lotion on dry skin around the edges of the cast. Do not put lotion on the skin underneath the cast.  Keep the cast clean and dry. If you have a splint or boot:  Wear the splint or boot as told by your health care provider. Remove it only as told by your health care provider.  Loosen the splint or boot if your fingers or toes tingle, become numb, or turn cold and blue.  Keep the splint or boot clean and dry. Bathing  Do not take baths, swim, or use a hot tub until your health care provider approves. Ask your health care provider if you may take showers. You may only be allowed to take sponge baths.  Keep the bandage (dressing) dry until your health care provider says it can be removed.  If your cast, splint, or boot is not waterproof: ? Do not let it get wet. ? Cover it with a watertight covering when you take a bath or a shower. Incision care  Follow instructions from your health care provider about how to take care of your incision. Make sure you: ? Wash your hands with soap and water before you change your dressing. If soap and water are not available, use hand sanitizer. ? Change your dressing as told by your health care provider. ? Leave stitches (sutures), skin glue, or adhesive strips in place. These skin closures may need to stay in place for 2 weeks or longer. If adhesive strip edges start to loosen and curl up, you may trim the loose edges. Do not remove  adhesive strips completely unless your health care provider tells you to do that.  Check your incision area every day for signs of infection. Check for: ? Redness. ? More swelling or pain. ? More fluid or blood. ? Warmth. ? Pus or a bad smell.   Managing pain, stiffness, and swelling  If directed, put ice on the affected area: ? If you have a removable splint or boot, remove it as told by your health care provider. ? Put ice in a plastic bag. ? Place a towel between your skin and the bag. ? Leave the ice on for 20 minutes, 2-3 times a day.  Move your fingers or toes often to avoid stiffness and to lessen swelling.  Raise (elevate) the injured area above the level of your heart while you are sitting or lying down.   Driving  Do not drive or use heavy machinery while taking prescription pain medicine.  Do not drive for 24 hours if you were given a medicine to help you relax (sedative) during your procedure.  Ask your health care provider when it is safe to drive if you have a cast, splint, or boot on the affected limb. Activity  Ask your health care provider what activities are safe for you during recovery, and ask what activities you need to avoid.  Do not use the injured limb to support your body weight until your health care provider says that you can.  Do not play contact sports until your health care provider approves.  Do exercises as told by your health care provider.  Avoid sitting for a long time without moving. Get up and move around at least every few hours. This will help prevent blood clots. General instructions  Do not put pressure on any part of the cast or splint until it is fully hardened. This may take several hours.  If you are taking prescription pain medicine, take actions to prevent or treat constipation. Your health care provider may recommend that you: ? Drink enough fluid to keep your urine pale yellow. ? Eat foods that are high in fiber, such as  fresh fruits and vegetables, whole grains, and beans. ? Limit foods that are high in fat and processed sugars, such as fried or sweet foods. ? Take an over-the-counter or prescription medicine for constipation.  Do not use any products that contain nicotine or tobacco, such as cigarettes and e-cigarettes. These can delay bone healing after surgery. If you need help quitting, ask your health care provider.  Take over-the-counter and prescription medicines only as told by your health care provider.  Keep all follow-up visits as told by your  health care provider. This is important. Contact a health care provider if:  You have lasting pain.  You have redness around your incision.  You have more swelling or pain around your incision.  You have more fluid or blood coming from your incision.  Your incision feels warm to the touch.  You have pus or a bad smell coming from your incision.  You are unable to do exercises or physical activity as told by your health care provider. Get help right away if:  You have difficulty breathing.  You have chest pain.  You have severe pain.  You have a fever or chills.  You have numbness for more than 24 hours in the area where the hardware was removed. Summary  After the procedure, it is common to have some pain and swelling in the area where the hardware was removed.  Follow instructions from your health care provider about how to take care of your incision.  Return to your normal activities as told by your health care provider. Ask your health care provider what activities are safe for you. This information is not intended to replace advice given to you by your health care provider. Make sure you discuss any questions you have with your health care provider. Document Revised: 10/08/2018 Document Reviewed: 09/04/2017 Elsevier Patient Education  [place].  Spinal Anesthesia and Epidural Anesthesia, Care After This sheet gives you  information about how to care for yourself after your procedure. Your doctor may also give you more specific instructions. If you have problems or questions, contact your doctor. What can I expect after the procedure? While the medicines you were given are still having effects, it is common to:  Feel like you may vomit (nauseous).  Vomit.  Have a numb feeling or tingling in your legs.  Have problems when you pee (urinate).  Feel itchy. Follow these instructions at home: For the time period you were told by your doctor:  Rest.  Do not do activities where you could fall or get hurt.  Do not drive or use machines.  Do not drink alcohol.  Do not take sleeping pills or medicines that make you drowsy.  Do not make big decisions or sign legal documents.  Do not take care of children on your own.   Eating and drinking  If you vomit, wait a short time. When you can drink without vomiting, try to drink some water, juice, or clear soup.  Drink enough fluid to keep your pee (urine) pale yellow.  Make sure you do not feel like vomiting before you eat solid foods.  Follow the diet that your doctor recommends. General instructions  If you have sleep apnea, surgery and certain medicines can raise your risk for breathing problems. Follow instructions from your doctor about when to wear your sleep device.  Have a responsible adult stay with you for the time you are told. It is important to have someone help care for you until you are awake and alert.  Return to your normal activities as told by your doctor. Ask your doctor what activities are safe for you.  Take over-the-counter and prescription medicines only as told by your doctor.  Do not use any products that contain nicotine or tobacco, such as cigarettes, e-cigarettes, and chewing tobacco. If you need help quitting, ask your doctor.  Keep all follow-up visits as told by your doctor. This is important. Contact a doctor if:  It  has been more than one day  since your procedure and you feel like you may vomit or you are vomiting.  You have a rash. Get help right away if:  You have a fever.  You have a headache that lasts a long time.  You have a very bad headache.  Your vision is blurry or you see two of a single object (double vision).  You are dizzy or light-headed.  You faint.  Your arms or legs tingle, feel weak, or get numb.  You have trouble breathing.  You cannot pee. These symptoms may be an emergency. Get help right away. Call your local emergency services (911 in the [place]  Do not wait to see if the symptoms will go away.  Do not drive yourself to the hospital. Summary  After the procedure, have a responsible adult stay with you at home.  Do not do activities that might cause you to get hurt.  Do not drive, use machinery, drink alcohol, or make important decisions as told by your doctor.  Do not use products that contain nicotine or tobacco.  Get help right away if you have a very bad headache, trouble breathing, or you cannot pee. This information is not intended to replace advice given to you by your health care provider. Make sure you discuss any questions you have with your health care provider. Document Revised: 04/27/2020 Document Reviewed: 09/30/2019 Elsevier Patient Education  2021 [place] Anesthesia, Adult, Care After This sheet gives you information about how to care for yourself after your procedure. Your health care provider may also give you more specific instructions. If you have problems or questions, contact your health care provider. What can I expect after the procedure? After the procedure, the following side effects are common:  Pain or discomfort at the IV site.  Nausea.  Vomiting.  Sore throat.  Trouble concentrating.  Feeling cold or chills.  Feeling weak or tired.  Sleepiness and fatigue.  Soreness and body aches. These side effects  can affect parts of the body that were not involved in surgery. Follow these instructions at home: For the time period you were told by your health care provider:  Rest.  Do not participate in activities where you could fall or become injured.  Do not drive or use machinery.  Do not drink alcohol.  Do not take sleeping pills or medicines that cause drowsiness.  Do not make important decisions or sign legal documents.  Do not take care of children on your own.   Eating and drinking  Follow any instructions from your health care provider about eating or drinking restrictions.  When you feel hungry, start by eating small amounts of foods that are soft and easy to digest (bland), such as toast. Gradually return to your regular diet.  Drink enough fluid to keep your urine pale yellow.  If you vomit, rehydrate by drinking water, juice, or clear broth. General instructions  If you have sleep apnea, surgery and certain medicines can increase your risk for breathing problems. Follow instructions from your health care provider about wearing your sleep device: ? Anytime you are sleeping, including during daytime naps. ? While taking prescription pain medicines, sleeping medicines, or medicines that make you drowsy.  Have a responsible adult stay with you for the time you are told. It is important to have someone help care for you until you are awake and alert.  Return to your normal activities as told by your health care provider. Ask your health care provider what  activities are safe for you.  Take over-the-counter and prescription medicines only as told by your health care provider.  If you smoke, do not smoke without supervision.  Keep all follow-up visits as told by your health care provider. This is important. Contact a health care provider if:  You have nausea or vomiting that does not get better with medicine.  You cannot eat or drink without vomiting.  You have pain that  does not get better with medicine.  You are unable to pass urine.  You develop a skin rash.  You have a fever.  You have redness around your IV site that gets worse. Get help right away if:  You have difficulty breathing.  You have chest pain.  You have blood in your urine or stool, or you vomit blood. Summary  After the procedure, it is common to have a sore throat or nausea. It is also common to feel tired.  Have a responsible adult stay with you for the time you are told. It is important to have someone help care for you until you are awake and alert.  When you feel hungry, start by eating small amounts of foods that are soft and easy to digest (bland), such as toast. Gradually return to your regular diet.  Drink enough fluid to keep your urine pale yellow.  Return to your normal activities as told by your health care provider. Ask your health care provider what activities are safe for you. This information is not intended to replace advice given to you by your health care provider. Make sure you discuss any questions you have with your health care provider. Document Revised: 04/27/2020 Document Reviewed: 11/25/2019 Elsevier Patient Education  2021 [employer].

## 2020-11-15 ENCOUNTER — Other Ambulatory Visit (HOSPITAL_COMMUNITY)
Admission: RE | Admit: 2020-11-15 | Discharge: 2020-11-15 | Disposition: A | Discharge status: Home / Self Care | Payer: Medicare Other | Source: Ambulatory Visit | Attending: Orthopedic Surgery | Admitting: Orthopedic Surgery

## 2020-11-15 ENCOUNTER — Encounter (HOSPITAL_COMMUNITY)
Admission: RE | Admit: 2020-11-15 | Discharge: 2020-11-15 | Disposition: A | Discharge status: Home / Self Care | Payer: Medicare Other | Source: Ambulatory Visit | Attending: Orthopedic Surgery | Admitting: Orthopedic Surgery

## 2020-11-15 ENCOUNTER — Other Ambulatory Visit: Payer: Self-pay

## 2020-11-15 ENCOUNTER — Encounter (HOSPITAL_COMMUNITY): Payer: Self-pay

## 2020-11-15 DIAGNOSIS — Z20822 Contact with and (suspected) exposure to covid-19: Secondary | ICD-10-CM | POA: Insufficient documentation

## 2020-11-15 DIAGNOSIS — Z01818 Encounter for other preprocedural examination: Secondary | ICD-10-CM | POA: Diagnosis not present

## 2020-11-15 LAB — CBC WITH DIFFERENTIAL/PLATELET
Abs Immature Granulocytes: 0.03 10*3/uL (ref 0.00–0.07)
Basophils Absolute: 0 10*3/uL (ref 0.0–0.1)
Basophils Relative: 1 %
Eosinophils Absolute: 0.1 10*3/uL (ref 0.0–0.5)
Eosinophils Relative: 2 %
HCT: 38.1 % — ABNORMAL LOW (ref 39.0–52.0)
Hemoglobin: 11.6 g/dL — ABNORMAL LOW (ref 13.0–17.0)
Immature Granulocytes: 0 %
Lymphocytes Relative: 33 %
Lymphs Abs: 2.2 10*3/uL (ref 0.7–4.0)
MCH: 25.9 pg — ABNORMAL LOW (ref 26.0–34.0)
MCHC: 30.4 g/dL (ref 30.0–36.0)
MCV: 85 fL (ref 80.0–100.0)
Monocytes Absolute: 0.6 10*3/uL (ref 0.1–1.0)
Monocytes Relative: 9 %
Neutro Abs: 3.7 10*3/uL (ref 1.7–7.7)
Neutrophils Relative %: 55 %
Platelets: 272 10*3/uL (ref 150–400)
RBC: 4.48 MIL/uL (ref 4.22–5.81)
RDW: 17.2 % — ABNORMAL HIGH (ref 11.5–15.5)
WBC: 6.7 10*3/uL (ref 4.0–10.5)
nRBC: 0 % (ref 0.0–0.2)

## 2020-11-15 LAB — BASIC METABOLIC PANEL
Anion gap: 9 (ref 5–15)
BUN: 19 mg/dL (ref 8–23)
CO2: 23 mmol/L (ref 22–32)
Calcium: 8.8 mg/dL — ABNORMAL LOW (ref 8.9–10.3)
Chloride: 105 mmol/L (ref 98–111)
Creatinine, Ser: 1.16 mg/dL (ref 0.61–1.24)
GFR, Estimated: >60 mL/min (ref >60)
Glucose, Bld: 135 mg/dL — ABNORMAL HIGH (ref 70–99)
Potassium: 3.8 mmol/L (ref 3.5–5.1)
Sodium: 137 mmol/L (ref 135–145)

## 2020-11-15 LAB — SARS CORONAVIRUS 2 (TAT 6-24 HRS): SARS Coronavirus 2: NEGATIVE

## 2020-11-15 NOTE — Progress Notes (Signed)
Pain has redness and pain on right hand.  Patient stated a pot of beans splashed his hand.

## 2020-11-16 NOTE — H&P (Signed)
Chief Complaint  Patient presents with  . Hip Pain    R/ its been hurting for awhile now, but the last two weeks have been bad.     Status post ORIF right hip March 2021 Patient had some right hip pain we believe is secondary to to the screws backing out Comes in today for preop repeat x-ray preparation for removal of hardware  Note also had epidural injection for lumbar spinal stenosis  Review of Systems  Respiratory: Negative for shortness of breath.   Cardiovascular: Negative for chest pain.  Musculoskeletal: Positive for back pain.  Neurological: Tingling: .pmh.psh.   Past Surgical History:  Procedure Laterality Date  . COLONOSCOPY  06/23/2007   JSH:FWYOVZCH polyps removed (cecum and hepatic flexure). The remainder of the colonic mucosa appeared normal/Left-sided diverticula/Normal rectum (tubular adenomas)  . COLONOSCOPY N/A 01/05/2014   Procedure: COLONOSCOPY;  Surgeon: Daneil Dolin, MD;  Location: [facility];  Service: Endoscopy;  Laterality: N/A;  9:30  . ESOPHAGOGASTRODUODENOSCOPY N/A 01/05/2014   Procedure: ESOPHAGOGASTRODUODENOSCOPY (EGD);  Surgeon: Daneil Dolin, MD;  Location: [facility];  Service: Endoscopy;  Laterality: N/A;  . HIP PINNING,CANNULATED Right 11/09/2019   Procedure: CANNULATED HIP PINNING;  Surgeon: Carole Civil, MD;  Location: [facility];  Service: Orthopedics;  Laterality: Right;  . KNEE ARTHROSCOPY  2003   right  . prostate biopsy  2016  . RADIOACTIVE SEED IMPLANT N/A 06/02/2015   Procedure: RADIOACTIVE SEED IMPLANT/BRACHYTHERAPY IMPLANT;  Surgeon: Festus Aloe, MD;  Location: [facility];  Service: Urology;  Laterality: N/A;  63 seeds implanted no seeds in bladder  . UMBILICAL HERNIA REPAIR N/A 02/12/2017   Procedure: HERNIA REPAIR UMBILICAL ADULT WITH MESH;  Surgeon: Aviva Signs, MD;  Location: [facility];  Service: General;  Laterality: N/A;       Past Medical History:  Diagnosis Date  . GERD  (gastroesophageal reflux disease)   . Hx of adenomatous colonic polyps   . Hypertension   . Prostate cancer ([place])   . Umbilical hernia with obstruction    BP 135/69   Pulse 75   Ht 5\' 6"  (1.676 m)   Wt 179 lb 2 oz (81.3 kg)   BMI 28.91 kg/m   Physical Exam  Well-healed incision right hip Tenderness over the hardware  Hip range of motion looks good May have slight shortening of the right leg  Vascular exam is intact  Tone is normal  Ongoing tenderness lumbar spine  X-rays 3 screws 3 washers to the screws are backing out there partially-threaded screws that are titanium  The procedure has been fully reviewed with the patient; The risks and benefits of surgery have been discussed and explained and understood. Alternative treatment has also been reviewed, questions were encouraged and answered. The postoperative plan is also been reviewed.   Remove screws right hip

## 2020-11-17 ENCOUNTER — Encounter (HOSPITAL_COMMUNITY): Payer: Self-pay | Admitting: Orthopedic Surgery

## 2020-11-17 ENCOUNTER — Ambulatory Visit (HOSPITAL_COMMUNITY): Payer: Medicare Other

## 2020-11-17 ENCOUNTER — Ambulatory Visit (HOSPITAL_COMMUNITY): Payer: Medicare Other | Admitting: Certified Registered"

## 2020-11-17 ENCOUNTER — Ambulatory Visit (HOSPITAL_COMMUNITY)
Admission: RE | Admit: 2020-11-17 | Discharge: 2020-11-17 | Disposition: A | Discharge status: Home / Self Care | Payer: Medicare Other | Source: Ambulatory Visit | Attending: Orthopedic Surgery | Admitting: Orthopedic Surgery

## 2020-11-17 ENCOUNTER — Encounter (HOSPITAL_COMMUNITY)
Admission: RE | Disposition: A | Discharge status: Home / Self Care | Payer: Self-pay | Source: Ambulatory Visit | Attending: Orthopedic Surgery

## 2020-11-17 DIAGNOSIS — T85848A Pain due to other internal prosthetic devices, implants and grafts, initial encounter: Secondary | ICD-10-CM

## 2020-11-17 DIAGNOSIS — T8484XA Pain due to internal orthopedic prosthetic devices, implants and grafts, initial encounter: Secondary | ICD-10-CM | POA: Diagnosis not present

## 2020-11-17 DIAGNOSIS — S72001A Fracture of unspecified part of neck of right femur, initial encounter for closed fracture: Secondary | ICD-10-CM | POA: Diagnosis not present

## 2020-11-17 DIAGNOSIS — Z472 Encounter for removal of internal fixation device: Secondary | ICD-10-CM | POA: Diagnosis not present

## 2020-11-17 DIAGNOSIS — S72001S Fracture of unspecified part of neck of right femur, sequela: Secondary | ICD-10-CM

## 2020-11-17 DIAGNOSIS — Y812 Prosthetic and other implants, materials and accessory general- and plastic-surgery devices associated with adverse incidents: Secondary | ICD-10-CM | POA: Diagnosis not present

## 2020-11-17 DIAGNOSIS — M25551 Pain in right hip: Secondary | ICD-10-CM | POA: Diagnosis not present

## 2020-11-17 HISTORY — PX: HARDWARE REMOVAL: SHX979

## 2020-11-17 SURGERY — REMOVAL, HARDWARE
Anesthesia: Spinal | Laterality: Right

## 2020-11-17 MED ORDER — BUPIVACAINE-EPINEPHRINE (PF) 0.5% -1:200000 IJ SOLN
INTRAMUSCULAR | Status: AC
  Filled 2020-11-17: qty 30

## 2020-11-17 MED ORDER — ACETAMINOPHEN 500 MG PO TABS
500.0000 mg | ORAL_TABLET | Freq: Once | ORAL | Status: AC
  Administered 2020-11-17: 500 mg via ORAL
  Filled 2020-11-17: qty 1

## 2020-11-17 MED ORDER — TRAMADOL-ACETAMINOPHEN 37.5-325 MG PO TABS: 1.0000 | ORAL_TABLET | ORAL | 0 refills | Status: AC | PRN

## 2020-11-17 MED ORDER — PROPOFOL 10 MG/ML IV BOLUS
INTRAVENOUS | Status: DC | PRN
  Administered 2020-11-17 (×3): 20 mg via INTRAVENOUS

## 2020-11-17 MED ORDER — EPHEDRINE SULFATE-NACL 50-0.9 MG/10ML-% IV SOSY
PREFILLED_SYRINGE | INTRAVENOUS | Status: DC | PRN
  Administered 2020-11-17: 10 mg via INTRAVENOUS
  Administered 2020-11-17 (×2): 5 mg via INTRAVENOUS

## 2020-11-17 MED ORDER — EPHEDRINE 5 MG/ML INJ
INTRAVENOUS | Status: AC
  Filled 2020-11-17: qty 10

## 2020-11-17 MED ORDER — CHLORHEXIDINE GLUCONATE 0.12 % MT SOLN
15.0000 mL | Freq: Once | OROMUCOSAL | Status: AC
  Administered 2020-11-17: 15 mL via OROMUCOSAL

## 2020-11-17 MED ORDER — CEFAZOLIN SODIUM-DEXTROSE 2-4 GM/100ML-% IV SOLN
2.0000 g | INTRAVENOUS | Status: AC
  Administered 2020-11-17: 2 g via INTRAVENOUS
  Filled 2020-11-17: qty 100

## 2020-11-17 MED ORDER — FENTANYL CITRATE (PF) 100 MCG/2ML IJ SOLN
25.0000 ug | INTRAMUSCULAR | Status: DC | PRN
  Administered 2020-11-17: 50 ug via INTRAVENOUS
  Filled 2020-11-17: qty 2

## 2020-11-17 MED ORDER — LIDOCAINE 2% (20 MG/ML) 5 ML SYRINGE
INTRAMUSCULAR | Status: DC | PRN
  Administered 2020-11-17: 50 mg via INTRAVENOUS

## 2020-11-17 MED ORDER — FENTANYL CITRATE (PF) 100 MCG/2ML IJ SOLN
INTRAMUSCULAR | Status: AC
  Filled 2020-11-17: qty 2

## 2020-11-17 MED ORDER — FENTANYL CITRATE (PF) 100 MCG/2ML IJ SOLN
INTRAMUSCULAR | Status: DC | PRN
  Administered 2020-11-17 (×2): 50 ug via INTRAVENOUS

## 2020-11-17 MED ORDER — SODIUM CHLORIDE 0.9 % IR SOLN
Status: DC | PRN
  Administered 2020-11-17: 1000 mL

## 2020-11-17 MED ORDER — ONDANSETRON HCL 4 MG/2ML IJ SOLN: 4.0000 mg | Freq: Once | INTRAMUSCULAR | Status: DC | PRN

## 2020-11-17 MED ORDER — ONDANSETRON HCL 4 MG/2ML IJ SOLN
INTRAMUSCULAR | Status: DC | PRN
  Administered 2020-11-17: 4 mg via INTRAVENOUS

## 2020-11-17 MED ORDER — TRAMADOL HCL 50 MG PO TABS
50.0000 mg | ORAL_TABLET | Freq: Four times a day (QID) | ORAL | Status: DC
Start: 2020-11-17 — End: 2020-11-17
  Administered 2020-11-17: 50 mg via ORAL
  Filled 2020-11-17: qty 1

## 2020-11-17 MED ORDER — PHENYLEPHRINE 40 MCG/ML (10ML) SYRINGE FOR IV PUSH (FOR BLOOD PRESSURE SUPPORT)
PREFILLED_SYRINGE | INTRAVENOUS | Status: DC | PRN
  Administered 2020-11-17: 80 ug via INTRAVENOUS
  Administered 2020-11-17: 120 ug via INTRAVENOUS
  Administered 2020-11-17: 80 ug via INTRAVENOUS
  Administered 2020-11-17: 120 ug via INTRAVENOUS
  Administered 2020-11-17 (×5): 80 ug via INTRAVENOUS

## 2020-11-17 MED ORDER — PHENYLEPHRINE 40 MCG/ML (10ML) SYRINGE FOR IV PUSH (FOR BLOOD PRESSURE SUPPORT)
PREFILLED_SYRINGE | INTRAVENOUS | Status: AC
  Filled 2020-11-17: qty 10

## 2020-11-17 MED ORDER — PROPOFOL 10 MG/ML IV BOLUS
INTRAVENOUS | Status: AC
  Filled 2020-11-17: qty 20

## 2020-11-17 MED ORDER — ONDANSETRON HCL 4 MG/2ML IJ SOLN
INTRAMUSCULAR | Status: AC
  Filled 2020-11-17: qty 2

## 2020-11-17 MED ORDER — LIDOCAINE HCL (PF) 2 % IJ SOLN
INTRAMUSCULAR | Status: AC
  Filled 2020-11-17: qty 5

## 2020-11-17 MED ORDER — BUPIVACAINE-EPINEPHRINE (PF) 0.5% -1:200000 IJ SOLN
INTRAMUSCULAR | Status: DC | PRN
  Administered 2020-11-17: 30 mL

## 2020-11-17 MED ORDER — LACTATED RINGERS IV SOLN
INTRAVENOUS | Status: DC
  Administered 2020-11-17: 1000 mL via INTRAVENOUS

## 2020-11-17 MED ORDER — ORAL CARE MOUTH RINSE: 15.0000 mL | Freq: Once | OROMUCOSAL | Status: AC

## 2020-11-17 MED ORDER — PROPOFOL 500 MG/50ML IV EMUL
INTRAVENOUS | Status: DC | PRN
  Administered 2020-11-17: 40 ug/kg/min via INTRAVENOUS

## 2020-11-17 SURGICAL SUPPLY — 43 items
APL PRP STRL LF DISP 70% ISPRP (MISCELLANEOUS) ×1
APL SKNCLS STERI-STRIP NONHPOA (GAUZE/BANDAGES/DRESSINGS) ×1
BAG HAMPER (MISCELLANEOUS) ×2 IMPLANT
BENZOIN TINCTURE PRP APPL 2/3 (GAUZE/BANDAGES/DRESSINGS) ×2 IMPLANT
BLADE SURG SZ10 CARB STEEL (BLADE) ×2 IMPLANT
BNDG GAUZE ELAST 4 BULKY (GAUZE/BANDAGES/DRESSINGS) ×2 IMPLANT
CHLORAPREP W/TINT 26 (MISCELLANEOUS) ×2 IMPLANT
CLOTH BEACON ORANGE TIMEOUT ST (SAFETY) ×2 IMPLANT
COVER LIGHT HANDLE STERIS (MISCELLANEOUS) ×2 IMPLANT
COVER WAND RF STERILE (DRAPES) ×2 IMPLANT
DECANTER SPIKE VIAL GLASS SM (MISCELLANEOUS) ×2 IMPLANT
DRAPE STERI IOBAN 125X83 (DRAPES) ×2 IMPLANT
DRESSING MEPILEX BORDER 6X8 (GAUZE/BANDAGES/DRESSINGS) ×1 IMPLANT
DRSG MEPILEX BORDER 6X8 (GAUZE/BANDAGES/DRESSINGS) ×2
GLOVE SKINSENSE NS SZ8.0 LF (GLOVE) ×1
GLOVE SKINSENSE STRL SZ8.0 LF (GLOVE) ×1 IMPLANT
GLOVE SS N UNI LF 8.5 STRL (GLOVE) ×2 IMPLANT
GLOVE SURG UNDER POLY LF SZ7 (GLOVE) ×2 IMPLANT
GOWN STRL REUS W/TWL LRG LVL3 (GOWN DISPOSABLE) ×2 IMPLANT
GOWN STRL REUS W/TWL XL LVL3 (GOWN DISPOSABLE) ×2 IMPLANT
KIT BLADEGUARD II DBL (SET/KITS/TRAYS/PACK) ×2 IMPLANT
KIT TURNOVER CYSTO (KITS) ×2 IMPLANT
MANIFOLD NEPTUNE II (INSTRUMENTS) ×2 IMPLANT
MARKER SKIN DUAL TIP RULER LAB (MISCELLANEOUS) ×2 IMPLANT
NEEDLE HYPO 21X1.5 SAFETY (NEEDLE) ×2 IMPLANT
NEEDLE SPNL 18GX3.5 QUINCKE PK (NEEDLE) ×2 IMPLANT
NS IRRIG 1000ML POUR BTL (IV SOLUTION) ×2 IMPLANT
PACK BASIC III (CUSTOM PROCEDURE TRAY) ×2
PACK SRG BSC III STRL LF ECLPS (CUSTOM PROCEDURE TRAY) ×1 IMPLANT
PAD ABD 5X9 TENDERSORB (GAUZE/BANDAGES/DRESSINGS) ×2 IMPLANT
PENCIL SMOKE EVACUATOR COATED (MISCELLANEOUS) ×2 IMPLANT
PUTTY DBX 2.5CC (Putty) ×2 IMPLANT
PUTTY DBX 2.5CC DEPUY (Putty) ×1 IMPLANT
SET BASIN LINEN APH (SET/KITS/TRAYS/PACK) ×2 IMPLANT
SPONGE LAP 18X18 RF (DISPOSABLE) ×2 IMPLANT
STRIP CLOSURE SKIN 1/2X4 (GAUZE/BANDAGES/DRESSINGS) ×2 IMPLANT
SUT BRALON NAB BRD #1 30IN (SUTURE) ×2 IMPLANT
SUT MNCRL 0 VIOLET CTX 36 (SUTURE) ×1 IMPLANT
SUT MON AB 2-0 CT1 36 (SUTURE) ×2 IMPLANT
SUT MONOCRYL 0 CTX 36 (SUTURE) ×2
SYR 30ML LL (SYRINGE) ×2 IMPLANT
SYR BULB IRRIG 60ML STRL (SYRINGE) ×2 IMPLANT
YANKAUER SUCT BULB TIP 10FT TU (MISCELLANEOUS) ×2 IMPLANT

## 2020-11-17 NOTE — Progress Notes (Signed)
Patient able to ambulate to car , moving both legs. Advised wife when arrive at home to obtain his walker in the house to assist with transfer of patient from car to home.  Wife verbalized understandiing.

## 2020-11-17 NOTE — Interval H&P Note (Signed)
History and Physical Interval Note:  11/17/2020 8:57 AM  Alejandro Krueger  has presented today for surgery, with the diagnosis of painful retained hardware right hip.  The various methods of treatment have been discussed with the patient and family. After consideration of risks, benefits and other options for treatment, the patient has consented to  Procedure(s): RIGHT HIP HARDWARE REMOVAL (Right) as a surgical intervention.  The patient's history has been reviewed, patient examined, no change in status, stable for surgery.  I have reviewed the patient's chart and labs.  Questions were answered to the patient's satisfaction.     Arther Abbott

## 2020-11-17 NOTE — Brief Op Note (Signed)
11/17/2020  10:29 AM  PATIENT:  Alejandro Krueger  80 y.o. male  PRE-OPERATIVE DIAGNOSIS:  painful retained hardware right hip  POST-OPERATIVE DIAGNOSIS:  painful retained hardware right hip  PROCEDURE:  Procedure(s): RIGHT HIP HARDWARE REMOVAL (Right) with DBX bonegraft ITEM 323557 2.5 CC   Findings: 3 screws 3 washers   SURGEON:  Surgeon(s) and Role:    Carole Civil, MD - Primary  -Findings  3 screws fracture had healed we also took out 3 washers.  Alejandro Krueger was seen in the preop area the surgical site was confirmed his right hip and marked chart review was completed  Patient was taken to the operating for spinal anesthesia.  He is placed on the fracture table with a perineal post the right leg was padded and placed in traction device.  The left leg was placed in a padded well leg holder  After sterile prep and drape and timeout I used the previous incision divided down to the fascia.  I split the fascia in line with the skin incision and divided the vastus lateralis fascia.  Muscle-splitting dissection was carried out until the screws were identified.  The screws were removed intact with the washers.  The screw holes were cleaned irrigated and then 2-1/2 cc of DBX bone putty was divided between the 3 screw holes  Radiographs were taken to confirm removal of the hardware  The fracture was confirmed to be healed  The wound was irrigated and closed in layered fashion with #1 Braylon 0 Monocryl and running 2-0 Monocryl with benzoin and Steri-Strips  Sterile dressing was applied  The patient was taken recovery room in stable condition  PHYSICIAN ASSISTANT:   ASSISTANTS: none   ANESTHESIA:   spinal  EBL:  MIN   BLOOD ADMINISTERED:none  DRAINS: none   LOCAL MEDICATIONS USED:  MARCAINE     SPECIMEN:  No Specimen  DISPOSITION OF SPECIMEN:  N/A  COUNTS:  YES  TOURNIQUET:  * No tourniquets in log *  DICTATION: .Dragon Dictation  PLAN OF CARE: Discharge to  home after PACU  PATIENT DISPOSITION:  PACU - hemodynamically stable.   Delay start of Pharmacological VTE agent (>24hrs) due to surgical blood loss or risk of bleeding: yes

## 2020-11-17 NOTE — Anesthesia Preprocedure Evaluation (Signed)
Anesthesia Evaluation  Patient identified by MRN, date of birth, ID band Patient awake    Reviewed: Allergy & Precautions, H&P , NPO status , Patient's Chart, lab work & pertinent test results, reviewed documented beta blocker date and time   Airway Mallampati: II  TM Distance: >3 FB Neck ROM: full    Dental no notable dental hx.    Pulmonary neg pulmonary ROS,    Pulmonary exam normal breath sounds clear to auscultation       Cardiovascular Exercise Tolerance: Good hypertension, negative cardio ROS   Rhythm:regular Rate:Normal     Neuro/Psych  Neuromuscular disease negative psych ROS   GI/Hepatic Neg liver ROS, GERD  Medicated,  Endo/Other  negative endocrine ROS  Renal/GU negative Renal ROS  negative genitourinary   Musculoskeletal   Abdominal   Peds  Hematology negative hematology ROS (+)   Anesthesia Other Findings   Reproductive/Obstetrics negative OB ROS                             Anesthesia Physical Anesthesia Plan  ASA: II  Anesthesia Plan: Spinal   Post-op Pain Management:    Induction:   PONV Risk Score and Plan: Propofol infusion  Airway Management Planned:   Additional Equipment:   Intra-op Plan:   Post-operative Plan:   Informed Consent: I have reviewed the patients History and Physical, chart, labs and discussed the procedure including the risks, benefits and alternatives for the proposed anesthesia with the patient or authorized representative who has indicated his/her understanding and acceptance.     Dental Advisory Given  Plan Discussed with: CRNA  Anesthesia Plan Comments:         Anesthesia Quick Evaluation

## 2020-11-17 NOTE — Anesthesia Procedure Notes (Signed)
Spinal  Patient location during procedure: OR Start time: 11/17/2020 9:17 AM End time: 11/17/2020 9:19 AM Reason for block: surgical anesthesia Staffing Performed: anesthesiologist  Anesthesiologist: Louann Sjogren, MD Preanesthetic Checklist Completed: patient identified, IV checked, site marked, risks and benefits discussed, surgical consent, monitors and equipment checked, pre-op evaluation and timeout performed Spinal Block Patient position: sitting Prep: ChloraPrep Patient monitoring: heart rate, cardiac monitor, continuous pulse ox and blood pressure Approach: midline Location: L4-5 Injection technique: single-shot Needle Needle type: Pencil-Tip  Needle gauge: 25 G Needle length: 9 cm Needle insertion depth: 4 cm Additional Notes No complications, pt. tol well, already endorsing numbness/tingling bilaterally.

## 2020-11-17 NOTE — Op Note (Signed)
11/17/2020  10:29 AM  PATIENT:  Alejandro Krueger  80 y.o. male  PRE-OPERATIVE DIAGNOSIS:  painful retained hardware right hip  POST-OPERATIVE DIAGNOSIS:  painful retained hardware right hip  PROCEDURE:  Procedure(s): RIGHT HIP HARDWARE REMOVAL (Right) with DBX bonegraft ITEM [id number] 2.5 CC   Findings: 3 screws 3 washers   SURGEON:  Surgeon(s) and Role:    Carole Civil, MD - Primary  -Findings  3 screws fracture had healed we also took out 3 washers.  Alejandro Krueger was seen in the preop area the surgical site was confirmed his right hip and marked chart review was completed  Patient was taken to the operating for spinal anesthesia.  He is placed on the fracture table with a perineal post the right leg was padded and placed in traction device.  The left leg was placed in a padded well leg holder  After sterile prep and drape and timeout I used the previous incision divided down to the fascia.  I split the fascia in line with the skin incision and divided the vastus lateralis fascia.  Muscle-splitting dissection was carried out until the screws were identified.  The screws were removed intact with the washers.  The screw holes were cleaned irrigated and then 2-1/2 cc of DBX bone putty was divided between the 3 screw holes  Radiographs were taken to confirm removal of the hardware  The fracture was confirmed to be healed  The wound was irrigated and closed in layered fashion with #1 Braylon 0 Monocryl and running 2-0 Monocryl with benzoin and Steri-Strips  Sterile dressing was applied  The patient was taken recovery room in stable condition  PHYSICIAN ASSISTANT:   ASSISTANTS: none   ANESTHESIA:   spinal  EBL:  MIN   BLOOD ADMINISTERED:none  DRAINS: none   LOCAL MEDICATIONS USED:  MARCAINE     SPECIMEN:  No Specimen  DISPOSITION OF SPECIMEN:  N/A  COUNTS:  YES  TOURNIQUET:  * No tourniquets in log *  DICTATION: .Dragon Dictation  PLAN OF CARE: Discharge to  home after PACU  PATIENT DISPOSITION:  PACU - hemodynamically stable.   Delay start of Pharmacological VTE agent (>24hrs) due to surgical blood loss or risk of bleeding: yes

## 2020-11-17 NOTE — Anesthesia Postprocedure Evaluation (Signed)
Anesthesia Post Note  Patient: CAP MASSI  Procedure(s) Performed: RIGHT HIP HARDWARE REMOVAL (Right )  Patient location during evaluation: PACU Anesthesia Type: Spinal Level of consciousness: awake Pain management: pain level controlled Vital Signs Assessment: post-procedure vital signs reviewed and stable Respiratory status: spontaneous breathing and respiratory function stable Cardiovascular status: blood pressure returned to baseline and stable Postop Assessment: no headache and no apparent nausea or vomiting Anesthetic complications: no   No complications documented.   Last Vitals:  Vitals:   11/17/20 1300 11/17/20 1316  BP: 127/80 (!) 152/75  Pulse: (!) 56 60  Resp: 13 16  Temp:  36.5 C  SpO2: 96% 99%    Last Pain:  Vitals:   11/17/20 1316  TempSrc: Oral  PainSc: [place]

## 2020-11-17 NOTE — Transfer of Care (Signed)
Immediate Anesthesia Transfer of Care Note  Patient: Alejandro Krueger  Procedure(s) Performed: RIGHT HIP HARDWARE REMOVAL (Right )  Patient Location: PACU  Anesthesia Type:Spinal  Level of Consciousness: awake, alert  and oriented  Airway & Oxygen Therapy: Patient Spontanous Breathing and Patient connected to face mask oxygen  Post-op Assessment: Report given to RN and Post -op Vital signs reviewed and stable  Post vital signs: Reviewed and stable  Last Vitals:  Vitals Value Taken Time  BP 90/58 11/17/20 1030  Temp    Pulse 68 11/17/20 1031  Resp 11 11/17/20 1031  SpO2 94 % 11/17/20 1031  Vitals shown include unvalidated device data.  Last Pain:  Vitals:   11/17/20 0728  TempSrc: Oral  PainSc: 4       Patients Stated Pain Goal: 8 (08/65/78 4696)  Complications: No complications documented.

## 2020-11-17 NOTE — Anesthesia Procedure Notes (Signed)
Date/Time: 11/17/2020 9:36 AM Performed by: Orlie Dakin, CRNA Pre-anesthesia Checklist: Patient identified, Emergency Drugs available, Suction available and Patient being monitored Oxygen Delivery Method: Non-rebreather mask Induction Type: IV induction Placement Confirmation: positive ETCO2

## 2020-11-20 ENCOUNTER — Encounter (HOSPITAL_COMMUNITY): Payer: Self-pay | Admitting: Orthopedic Surgery

## 2020-11-29 ENCOUNTER — Other Ambulatory Visit: Payer: Self-pay

## 2020-11-29 ENCOUNTER — Encounter: Payer: Self-pay | Admitting: Orthopedic Surgery

## 2020-11-29 ENCOUNTER — Ambulatory Visit (INDEPENDENT_AMBULATORY_CARE_PROVIDER_SITE_OTHER): Payer: Medicare Other | Admitting: Orthopedic Surgery

## 2020-11-29 DIAGNOSIS — Z9889 Other specified postprocedural states: Secondary | ICD-10-CM

## 2020-11-29 DIAGNOSIS — Z8781 Personal history of (healed) traumatic fracture: Secondary | ICD-10-CM

## 2020-11-29 NOTE — Progress Notes (Signed)
Chief Complaint  Patient presents with  . Hip Pain    Left hip s/p 11/17/20.   PRE-OPERATIVE DIAGNOSIS:  painful retained hardware right hip  POST-OPERATIVE DIAGNOSIS:  painful retained hardware right hip  PROCEDURE:  Procedure(s): RIGHT HIP HARDWARE REMOVAL (Right) with DBX bonegraft ITEM [id number] 2.5 CC   Findings: 3 screws 3 washers  12 days post op   Sore   Wound clean   So he can shower now  F/u 2 weeks

## 2020-12-14 ENCOUNTER — Ambulatory Visit (INDEPENDENT_AMBULATORY_CARE_PROVIDER_SITE_OTHER): Payer: Medicare Other | Admitting: Orthopedic Surgery

## 2020-12-14 ENCOUNTER — Other Ambulatory Visit: Payer: Self-pay

## 2020-12-14 ENCOUNTER — Encounter: Payer: Self-pay | Admitting: Orthopedic Surgery

## 2020-12-14 VITALS — BP 152/82 | HR 74 | Ht 68.0 in | Wt 176.0 lb

## 2020-12-14 DIAGNOSIS — Z8781 Personal history of (healed) traumatic fracture: Secondary | ICD-10-CM

## 2020-12-14 DIAGNOSIS — Z9889 Other specified postprocedural states: Secondary | ICD-10-CM

## 2020-12-14 NOTE — Progress Notes (Deleted)
             PRE-OPERATIVE DIAGNOSIS:  painful retained hardware right hip   POST-OPERATIVE DIAGNOSIS:  painful retained hardware right hip   PROCEDURE:  Procedure(s): RIGHT HIP HARDWARE REMOVAL (Right) with DBX bonegraft ITEM [id number] 2.5 CC    Findings: 3 screws 3 washers  12 days post op    Sore    Wound clean    So he can shower now   F/u 2 weeks

## 2020-12-14 NOTE — Progress Notes (Signed)
Chief Complaint  Patient presents with  . Hip Pain    S/p ORIF 11/09/19, rt hip pinning hardware removal 11/17/20   recheck 3 weeks post op HWR rt hip   Hip ROM is normal now   No complaints   Encounter Diagnosis  Name Primary?  . S/P ORIF (open reduction internal fixation) fracture 11/09/19 right hip pinning hardware removal 11/21/20 Yes    Fu prn    Last visit  PRE-OPERATIVE DIAGNOSIS:  painful retained hardware right hip   POST-OPERATIVE DIAGNOSIS:  painful retained hardware right hip   PROCEDURE:  Procedure(s): RIGHT HIP HARDWARE REMOVAL (Right) with DBX bonegraft ITEM [id number] 2.5 CC    Findings: 3 screws 3 washers  12 days post op    Sore    Wound clean    So he can shower now   F/u 2 weeks

## 2020-12-14 NOTE — Patient Instructions (Signed)
Activity as tolerated

## 2020-12-15 DIAGNOSIS — D509 Iron deficiency anemia, unspecified: Secondary | ICD-10-CM | POA: Diagnosis not present

## 2020-12-15 DIAGNOSIS — I10 Essential (primary) hypertension: Secondary | ICD-10-CM | POA: Diagnosis not present

## 2020-12-15 DIAGNOSIS — R7301 Impaired fasting glucose: Secondary | ICD-10-CM | POA: Diagnosis not present

## 2020-12-15 DIAGNOSIS — E782 Mixed hyperlipidemia: Secondary | ICD-10-CM | POA: Diagnosis not present

## 2020-12-15 DIAGNOSIS — E1165 Type 2 diabetes mellitus with hyperglycemia: Secondary | ICD-10-CM | POA: Diagnosis not present

## 2020-12-20 DIAGNOSIS — E782 Mixed hyperlipidemia: Secondary | ICD-10-CM | POA: Diagnosis not present

## 2020-12-20 DIAGNOSIS — I10 Essential (primary) hypertension: Secondary | ICD-10-CM | POA: Diagnosis not present

## 2020-12-20 DIAGNOSIS — N1831 Chronic kidney disease, stage 3a: Secondary | ICD-10-CM | POA: Diagnosis not present

## 2020-12-20 DIAGNOSIS — D509 Iron deficiency anemia, unspecified: Secondary | ICD-10-CM | POA: Diagnosis not present

## 2020-12-20 DIAGNOSIS — E1122 Type 2 diabetes mellitus with diabetic chronic kidney disease: Secondary | ICD-10-CM | POA: Diagnosis not present

## 2020-12-20 DIAGNOSIS — R944 Abnormal results of kidney function studies: Secondary | ICD-10-CM | POA: Diagnosis not present

## 2020-12-20 DIAGNOSIS — K219 Gastro-esophageal reflux disease without esophagitis: Secondary | ICD-10-CM | POA: Diagnosis not present

## 2020-12-20 DIAGNOSIS — M84359D Stress fracture, hip, unspecified, subsequent encounter for fracture with routine healing: Secondary | ICD-10-CM | POA: Diagnosis not present

## 2021-04-21 IMAGING — RF DG HIP (WITH PELVIS) OPERATIVE*R*
1 series · 2 of 2 positions shown · non-contrast
Comparison: 11/06/2020

CLINICAL DATA: Removal of hardware

EXAM:
OPERATIVE right HIP (WITH PELVIS IF PERFORMED) 2 VIEWS
TECHNIQUE: Fluoroscopic spot image(s) were submitted for interpretation
post-operatively.

[Series 1: run · 2 of 2 slices shown]
[im 1/2]
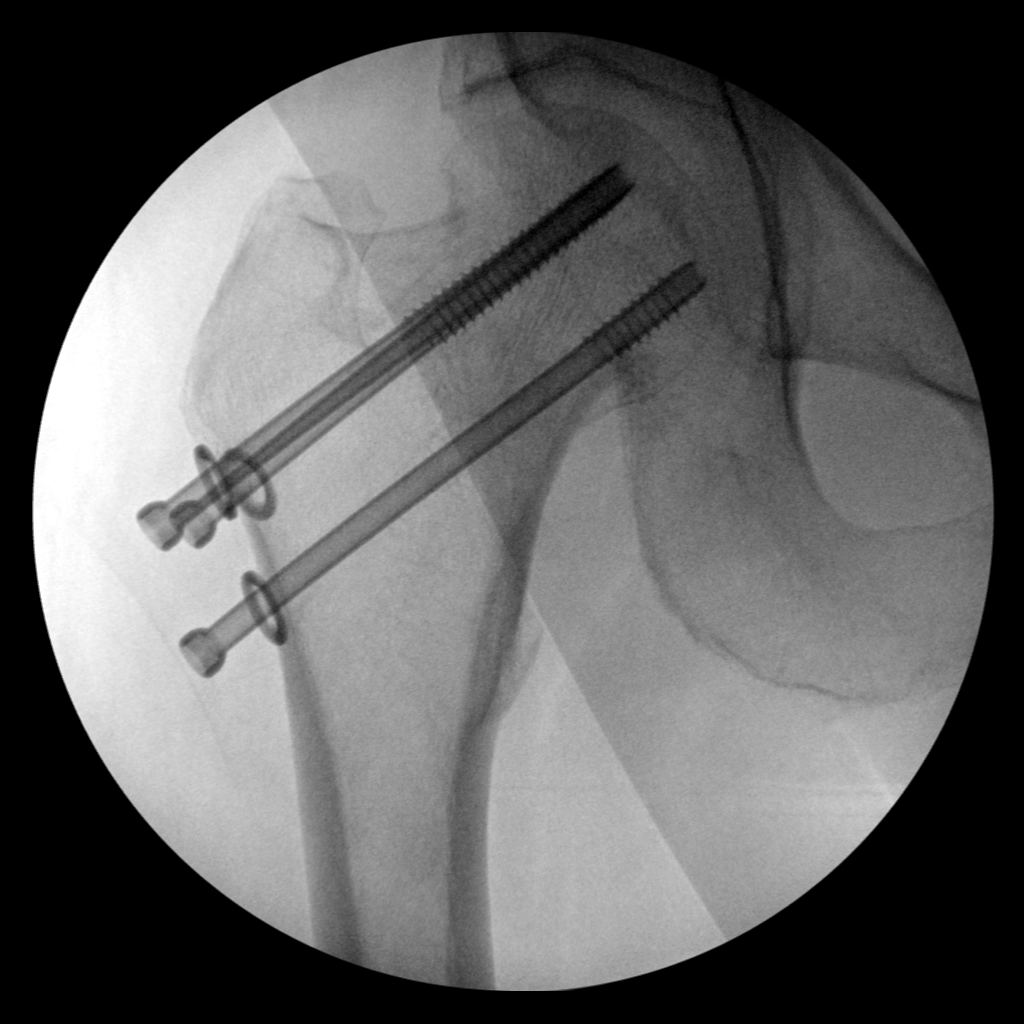
[im 2/2]
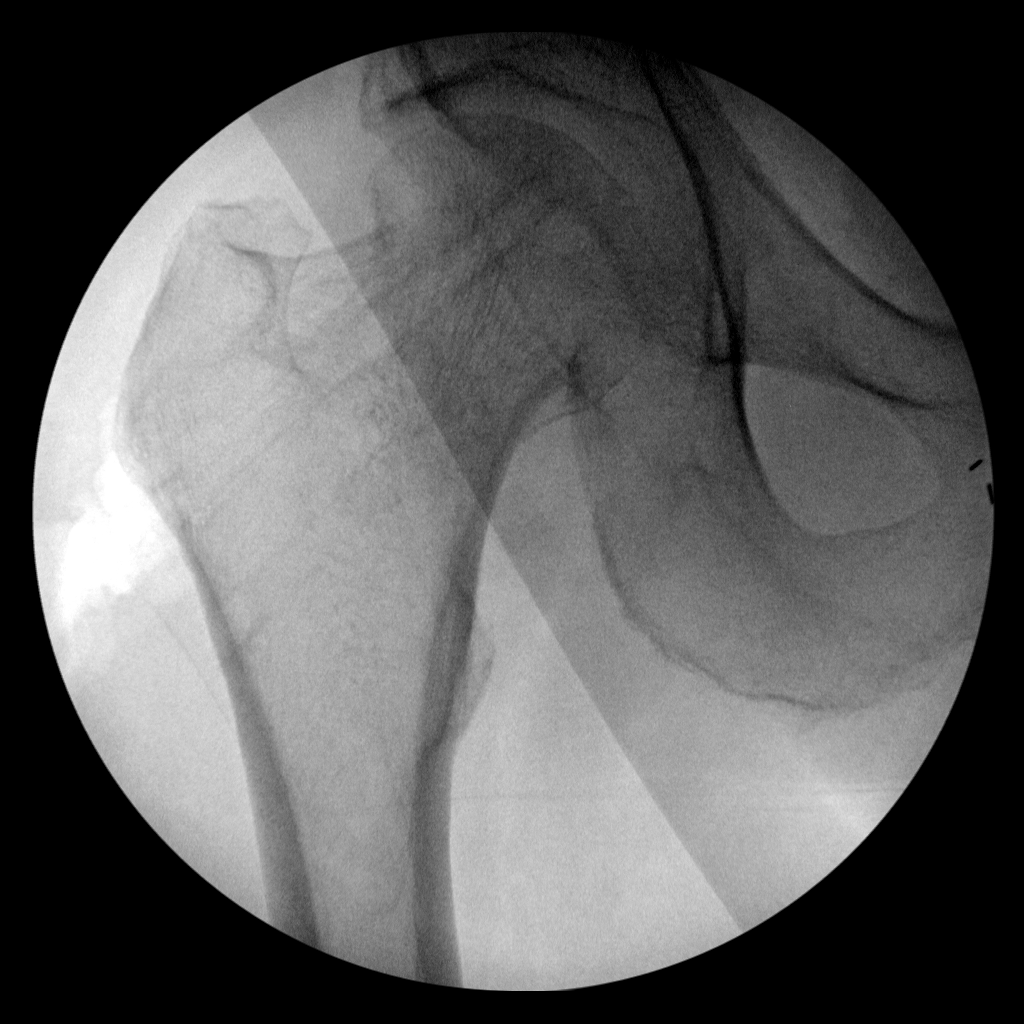

[2 of 2 positions shown; findings below may reference images not displayed]

FINDINGS: Three Knowles type pins have been removed. Screw shadows remain
visible. No evidence of fracture. Previous treated femoral neck
fracture. No retained components.
IMPRESSION: Three pins removed.  No retained components.  No unexpected finding.

## 2021-05-16 DIAGNOSIS — E119 Type 2 diabetes mellitus without complications: Secondary | ICD-10-CM | POA: Diagnosis not present

## 2021-05-16 DIAGNOSIS — I10 Essential (primary) hypertension: Secondary | ICD-10-CM | POA: Diagnosis not present

## 2021-05-23 DIAGNOSIS — N1831 Chronic kidney disease, stage 3a: Secondary | ICD-10-CM | POA: Diagnosis not present

## 2021-05-23 DIAGNOSIS — E1122 Type 2 diabetes mellitus with diabetic chronic kidney disease: Secondary | ICD-10-CM | POA: Diagnosis not present

## 2021-05-23 DIAGNOSIS — D509 Iron deficiency anemia, unspecified: Secondary | ICD-10-CM | POA: Diagnosis not present

## 2021-05-23 DIAGNOSIS — I10 Essential (primary) hypertension: Secondary | ICD-10-CM | POA: Diagnosis not present

## 2021-05-23 DIAGNOSIS — K219 Gastro-esophageal reflux disease without esophagitis: Secondary | ICD-10-CM | POA: Diagnosis not present

## 2021-05-23 DIAGNOSIS — M84351D Stress fracture, right femur, subsequent encounter for fracture with routine healing: Secondary | ICD-10-CM | POA: Diagnosis not present

## 2021-05-23 DIAGNOSIS — R809 Proteinuria, unspecified: Secondary | ICD-10-CM | POA: Diagnosis not present

## 2021-05-23 DIAGNOSIS — D72829 Elevated white blood cell count, unspecified: Secondary | ICD-10-CM | POA: Diagnosis not present

## 2021-05-23 DIAGNOSIS — Z23 Encounter for immunization: Secondary | ICD-10-CM | POA: Diagnosis not present

## 2021-05-23 DIAGNOSIS — E782 Mixed hyperlipidemia: Secondary | ICD-10-CM | POA: Diagnosis not present

## 2021-05-24 DIAGNOSIS — R350 Frequency of micturition: Secondary | ICD-10-CM | POA: Diagnosis not present

## 2021-06-13 DIAGNOSIS — H25813 Combined forms of age-related cataract, bilateral: Secondary | ICD-10-CM | POA: Diagnosis not present

## 2021-10-17 ENCOUNTER — Ambulatory Visit: Payer: Medicare Other

## 2021-10-17 ENCOUNTER — Encounter: Payer: Self-pay | Admitting: Orthopedic Surgery

## 2021-10-17 ENCOUNTER — Other Ambulatory Visit: Payer: Self-pay

## 2021-10-17 ENCOUNTER — Ambulatory Visit: Payer: Medicare Other | Admitting: Orthopedic Surgery

## 2021-10-17 VITALS — BP 167/98 | HR 112 | Ht 68.0 in | Wt 172.0 lb

## 2021-10-17 DIAGNOSIS — M545 Low back pain, unspecified: Secondary | ICD-10-CM | POA: Diagnosis not present

## 2021-10-17 DIAGNOSIS — M25552 Pain in left hip: Secondary | ICD-10-CM

## 2021-10-17 MED ORDER — IBUPROFEN 800 MG PO TABS: 800.0000 mg | ORAL_TABLET | Freq: Three times a day (TID) | ORAL | 1 refills | Status: DC | PRN

## 2021-10-17 NOTE — Patient Instructions (Signed)
Take the ibuprofen 800 mg 3 times a day as needed until symptoms resolve if symptoms worsen or you start to feel pain running down your leg into your foot please call back for another appointment

## 2021-10-17 NOTE — Progress Notes (Signed)
Chief Complaint  Patient presents with   Hip Pain    Left lateral hip for a couple weeks     [place] had hardware removal from his right hip after pinning comes in with left hip pain in the left buttock nonradiating he had an MRI back in 2021 for right-sided symptoms  He says he 800 mg ibuprofen seem to help but when his pain initially occurred he had trouble walking  His exam is negative except for some tenderness near the left ischium.  His lumbar spine is nontender his hip range of motion is normal  X-ray pelvis normal left hip previous hardware removal right hip no complications  MRI report from 2021 in November IMPRESSION: 1. At L4-5, there is a shallow disc protrusion. Annular fissures with annular bulging in both foramina, right more than left. There is mild stenosis of both lateral recesses. Neural compression is not demonstrated, but there would be potential for right-sided neural irritation based on the annular fissures. 2. L3-4: Disc bulge. Mild facet and ligamentous prominence. Mild bilateral lateral recess stenosis but without visible neural compression.     Electronically Signed   By: Nelson Chimes M.D.   On: 07/12/2020 13:30   I think he can continue with his ibuprofen 803 times a day until his symptoms resolve  He should have a brief period of rest to avoid aggravating his back  Meds ordered this encounter  Medications   ibuprofen (ADVIL) 800 MG tablet    Sig: Take 1 tablet (800 mg total) by mouth every 8 (eight) hours as needed.    Dispense:  90 tablet    Refill:  1

## 2021-11-16 DIAGNOSIS — E1122 Type 2 diabetes mellitus with diabetic chronic kidney disease: Secondary | ICD-10-CM | POA: Diagnosis not present

## 2021-11-20 DIAGNOSIS — E1122 Type 2 diabetes mellitus with diabetic chronic kidney disease: Secondary | ICD-10-CM | POA: Diagnosis not present

## 2021-11-20 DIAGNOSIS — M545 Low back pain, unspecified: Secondary | ICD-10-CM | POA: Diagnosis not present

## 2021-11-20 DIAGNOSIS — M84351D Stress fracture, right femur, subsequent encounter for fracture with routine healing: Secondary | ICD-10-CM | POA: Diagnosis not present

## 2021-11-20 DIAGNOSIS — D509 Iron deficiency anemia, unspecified: Secondary | ICD-10-CM | POA: Diagnosis not present

## 2021-11-20 DIAGNOSIS — E782 Mixed hyperlipidemia: Secondary | ICD-10-CM | POA: Diagnosis not present

## 2021-11-20 DIAGNOSIS — D72829 Elevated white blood cell count, unspecified: Secondary | ICD-10-CM | POA: Diagnosis not present

## 2021-11-20 DIAGNOSIS — R809 Proteinuria, unspecified: Secondary | ICD-10-CM | POA: Diagnosis not present

## 2021-11-20 DIAGNOSIS — K219 Gastro-esophageal reflux disease without esophagitis: Secondary | ICD-10-CM | POA: Diagnosis not present

## 2021-11-20 DIAGNOSIS — I10 Essential (primary) hypertension: Secondary | ICD-10-CM | POA: Diagnosis not present

## 2021-11-20 DIAGNOSIS — N1831 Chronic kidney disease, stage 3a: Secondary | ICD-10-CM | POA: Diagnosis not present

## 2022-01-28 ENCOUNTER — Ambulatory Visit: Payer: Medicare Other | Admitting: Orthopedic Surgery

## 2022-02-07 ENCOUNTER — Ambulatory Visit (INDEPENDENT_AMBULATORY_CARE_PROVIDER_SITE_OTHER): Payer: Medicare Other

## 2022-02-07 ENCOUNTER — Ambulatory Visit: Payer: Medicare Other | Admitting: Orthopedic Surgery

## 2022-02-07 ENCOUNTER — Encounter: Payer: Self-pay | Admitting: Orthopedic Surgery

## 2022-02-07 VITALS — BP 107/75 | HR 75 | Ht 68.0 in | Wt 174.0 lb

## 2022-02-07 DIAGNOSIS — R2231 Localized swelling, mass and lump, right upper limb: Secondary | ICD-10-CM | POA: Diagnosis not present

## 2022-02-07 NOTE — Progress Notes (Signed)
Chief Complaint  Patient presents with   Wrist Pain    Rt wrist nodule for 6-7 months getting bigger and painful    This is an 81 year old male with a mass over the dorsum of his right wrist he said has been there about 6 months and then got bigger over the last 2 to 3 weeks  Not causing any pain  Patient reports no history of any wrist issues over his lifetime  Review of systems no catching locking giving way of the wrist no numbness or tingling of the hand  He does note some nailbed changes of his thumb and index finger   Past Medical History:  Diagnosis Date   GERD (gastroesophageal reflux disease)    Hx of adenomatous colonic polyps    Hypertension    Prostate cancer ([place])    Umbilical hernia with obstruction     BP 107/75   Pulse 75   Ht '5\' 8"'$  (1.727 m)   Wt 174 lb (78.9 kg)   BMI 26.46 kg/m   He is awake alert and oriented x3 mood and affect normal walks without any assistive device and no limp  Right wrist has a lobulated dorsal ganglion cyst approximately mid portion of the dorsum of the wrist and hand  It is nontender range of motion is normal no clicks no instability  X-rays show soft tissue mass over the dorsum of the wrist and the area of the subcutaneous tissue.  On x-ray he has a scapholunate widening and radial ulnar arthritis as well as scaphoid trapezial arthritis  Procedure patient consented to aspiration right wrist  Site confirmed  Skin prepped with alcohol and ethyl chloride and then 18-gauge needle used to aspirate 45 cc of gelatinous fluid  Patient wants this removed if it comes back we told him that 50% come back with aspiration 20% come back with surgery  He says he is still rather have it removed if necessary  He will let us know and follow-up as needed. Aspiration right dorsal wrist

## 2022-05-01 DIAGNOSIS — R350 Frequency of micturition: Secondary | ICD-10-CM | POA: Diagnosis not present

## 2022-05-22 DIAGNOSIS — E1122 Type 2 diabetes mellitus with diabetic chronic kidney disease: Secondary | ICD-10-CM | POA: Diagnosis not present

## 2022-05-23 DIAGNOSIS — M545 Low back pain, unspecified: Secondary | ICD-10-CM | POA: Diagnosis not present

## 2022-05-23 DIAGNOSIS — N1831 Chronic kidney disease, stage 3a: Secondary | ICD-10-CM | POA: Diagnosis not present

## 2022-05-23 DIAGNOSIS — Z0000 Encounter for general adult medical examination without abnormal findings: Secondary | ICD-10-CM | POA: Diagnosis not present

## 2022-05-23 DIAGNOSIS — E782 Mixed hyperlipidemia: Secondary | ICD-10-CM | POA: Diagnosis not present

## 2022-05-23 DIAGNOSIS — D509 Iron deficiency anemia, unspecified: Secondary | ICD-10-CM | POA: Diagnosis not present

## 2022-05-23 DIAGNOSIS — R809 Proteinuria, unspecified: Secondary | ICD-10-CM | POA: Diagnosis not present

## 2022-05-23 DIAGNOSIS — K219 Gastro-esophageal reflux disease without esophagitis: Secondary | ICD-10-CM | POA: Diagnosis not present

## 2022-05-23 DIAGNOSIS — E1122 Type 2 diabetes mellitus with diabetic chronic kidney disease: Secondary | ICD-10-CM | POA: Diagnosis not present

## 2022-05-23 DIAGNOSIS — I10 Essential (primary) hypertension: Secondary | ICD-10-CM | POA: Diagnosis not present

## 2022-05-23 DIAGNOSIS — D72829 Elevated white blood cell count, unspecified: Secondary | ICD-10-CM | POA: Diagnosis not present

## 2022-05-23 DIAGNOSIS — Z23 Encounter for immunization: Secondary | ICD-10-CM | POA: Diagnosis not present

## 2022-06-13 DIAGNOSIS — L282 Other prurigo: Secondary | ICD-10-CM | POA: Diagnosis not present

## 2022-06-13 DIAGNOSIS — D509 Iron deficiency anemia, unspecified: Secondary | ICD-10-CM | POA: Diagnosis not present

## 2022-06-13 DIAGNOSIS — I10 Essential (primary) hypertension: Secondary | ICD-10-CM | POA: Diagnosis not present

## 2022-06-13 DIAGNOSIS — D72829 Elevated white blood cell count, unspecified: Secondary | ICD-10-CM | POA: Diagnosis not present

## 2022-09-11 NOTE — Progress Notes (Signed)
[facility] Note  09/16/2022     CHIEF COMPLAINT Patient presents for Retina Evaluation   HISTORY OF PRESENT ILLNESS: Alejandro Krueger is a 82 y.o. male who presents to the clinic today for:   HPI     Retina Evaluation   In both eyes.  This started 2 weeks ago.  Duration of 2 weeks.  Associated Symptoms Distortion.  Context:  distance vision and mid-range vision.  I, the attending physician,  performed the HPI with the patient and updated documentation appropriately.        Comments   Retina eval dor AMD OU per Dr. Sherlene Shams pt is reporting a film over his eyes he denies any flashes or floaters       Last edited by Bernarda Caffey, MD on 09/17/2022 10:32 PM.    Pt states VA has declined progressively.   Referring physician: Madelin Headings, DO [address] [place],  [place] [address]  HISTORICAL INFORMATION:   Selected notes from the MEDICAL RECORD NUMBER Referred by Dr. Jorja Loa for RPE changes OD LEE:  Ocular Hx- PMH-    CURRENT MEDICATIONS: No current outpatient medications on file. (Ophthalmic Drugs)   No current facility-administered medications for this visit. (Ophthalmic Drugs)   Current Outpatient Medications (Other)  Medication Sig   amLODipine (NORVASC) 5 MG tablet Take 5 mg by mouth in the morning.   buPROPion (WELLBUTRIN XL) 150 MG 24 hr tablet Take 150 mg by mouth in the morning.   ibuprofen (ADVIL) 800 MG tablet Take 1 tablet (800 mg total) by mouth every 8 (eight) hours as needed. (Patient not taking: Reported on 02/07/2022)   lisinopril (ZESTRIL) 20 MG tablet Take 20 mg by mouth in the morning.   omeprazole (PRILOSEC) 20 MG capsule Take 20 mg by mouth daily.   pravastatin (PRAVACHOL) 20 MG tablet Take 20 mg by mouth in the morning.   tamsulosin (FLOMAX) 0.4 MG CAPS capsule    No current facility-administered medications for this visit. (Other)   REVIEW OF SYSTEMS: ROS   Positive for: Eyes Last edited by Bernarda Caffey,  MD on 09/17/2022 10:32 PM.     ALLERGIES No Known Allergies  PAST MEDICAL HISTORY Past Medical History:  Diagnosis Date   GERD (gastroesophageal reflux disease)    Hx of adenomatous colonic polyps    Hypertension    Prostate cancer ([place])    Umbilical hernia with obstruction    Past Surgical History:  Procedure Laterality Date   COLONOSCOPY  06/23/2007   [id number] polyps removed (cecum and hepatic flexure). The remainder of the colonic mucosa appeared normal/Left-sided diverticula/Normal rectum (tubular adenomas)   COLONOSCOPY N/A 01/05/2014   Procedure: COLONOSCOPY;  Surgeon: Daneil Dolin, MD;  [facility];  Service: Endoscopy;  Laterality: N/A;  9:30   ESOPHAGOGASTRODUODENOSCOPY N/A 01/05/2014   Procedure: ESOPHAGOGASTRODUODENOSCOPY (EGD);  Surgeon: Daneil Dolin, MD;  [facility]: [facility];  Service: Endoscopy;  Laterality: N/A;   HARDWARE REMOVAL Right 11/17/2020   Procedure: RIGHT HIP HARDWARE REMOVAL;  Surgeon: Carole Civil, MD;  Location: [facility];  Service: Orthopedics;  Laterality: Right;   HIP PINNING,CANNULATED Right 11/09/2019   Procedure: CANNULATED HIP PINNING;  Surgeon: Carole Civil, MD;  Location: [facility];  Service: Orthopedics;  Laterality: Right;   KNEE ARTHROSCOPY  2003   right   prostate biopsy  2016   RADIOACTIVE SEED IMPLANT N/A 06/02/2015   Procedure: RADIOACTIVE SEED IMPLANT/BRACHYTHERAPY IMPLANT;  Surgeon: Festus Aloe, MD;  Location: [facility];  Service: Urology;  Laterality: N/A;  63 seeds implanted no seeds in bladder   UMBILICAL HERNIA REPAIR N/A 02/12/2017   Procedure: HERNIA REPAIR UMBILICAL ADULT WITH MESH;  Surgeon: Aviva Signs, MD;  Location: [facility];  Service: General;  Laterality: N/A;   FAMILY HISTORY Family History  Problem Relation Age of Onset   Diabetes Mother    Heart attack Father    Colon cancer Neg Hx    SOCIAL HISTORY Social History   Tobacco Use   Smoking status: Never    Smokeless tobacco: Never  Vaping Use   Vaping Use: Never used  Substance Use Topics   Alcohol use: No   Drug use: No       OPHTHALMIC EXAM:  Base Eye Exam     Visual Acuity (Snellen - Linear)       Right Left   Dist cc 20/100 -2 20/60   Dist ph cc 20/50 -1 20/50 +2    Correction: Glasses         Tonometry (Tonopen, 9:25 AM)       Right Left   Pressure 12 15         Pupils       Pupils Dark Light Shape React APD   Right PERRL 3 2 Round Brisk None   Left PERRL 3 2 Round Brisk None         Visual Fields       Left Right    Full Full         Extraocular Movement       Right Left    Full, Ortho Full, Ortho         Neuro/Psych     Oriented x3: Yes   Mood/Affect: Normal         Dilation     Both eyes: 2.5% Phenylephrine @ 9:26 AM           Slit Lamp and Fundus Exam     Slit Lamp Exam       Right Left   Lids/Lashes Dermatochalasis - upper lid, Meibomian gland dysfunction Dermatochalasis - upper lid, Meibomian gland dysfunction   Conjunctiva/Sclera White and quiet, mild Conjunctivochalasis White and quiet, temp pinguecula   Cornea Trace tear film debri Trace tear film debris, trace PEE   Anterior Chamber Deep and Clear Deep, narrow temporal angle, Clear   Iris Round and poorly dialted 64m Round and poorly dialted   Lens 2-3+ Nuclear sclerosis, 2-3+ Cortical cataract 2-3+ Nuclear sclerosis, 2-3+ Cortical cataract   Anterior Vitreous Vitreous syneresis mild Posterior vitreous detachment, Vitreous syneresis         Fundus Exam       Right Left   Disc Pink and sharp, Temporal PPA, mild peirpa Pink and sharp   C/D Ratio 0.5 0.6   Macula Flat, blunted foveal reflex, fine drusen, RPE mottling and clumping, mild peripapillary cystic changes nasal mac Flat, blunted foveal reflex, drusen, RPE mottling, no heme or edema.   Vessels Attenuated, tortuous Attenuated, tortous   Periphery Attached, no heme Attached, no heme.            Refraction     Wearing Rx       Sphere Cylinder Axis Add   Right +1.75 +1.25 010 +2.50   Left +1.50 +1.75 171             IMAGING AND PROCEDURES  Imaging and Procedures for 09/16/2022  OCT, Retina - OU -  Both Eyes       Right Eye Quality was good. Central Foveal Thickness: 312. Progression has no prior data. Findings include normal foveal contour, no SRF, retinal drusen , subretinal hyper-reflective material, intraretinal fluid, outer retinal atrophy, vitreomacular adhesion (Mild peripapillary IRF nasal mac, central vitelliform like lesion w/ surrounding ORA, mild dusen. ).   Left Eye Quality was good. Central Foveal Thickness: 321. Progression has no prior data. Findings include normal foveal contour, no IRF, no SRF (Partial PVD ).   Notes *Images captured and stored on drive  Diagnosis / Impression:  OD: Mild peripapillary IRF nasal mac, central vitelliform like lesion w/ surrounding ORA, mild dusen.  OS: NFP; no IRF/SRF; partial PVD   Clinical management:  See below  Abbreviations: NFP - Normal foveal profile. CME - cystoid macular edema. PED - pigment epithelial detachment. IRF - intraretinal fluid. SRF - subretinal fluid. EZ - ellipsoid zone. ERM - epiretinal membrane. ORA - outer retinal atrophy. ORT - outer retinal tubulation. SRHM - subretinal hyper-reflective material. IRHM - intraretinal hyper-reflective material      Fluorescein Angiography Optos (Transit OD)       Right Eye Progression has no prior data. Early phase findings include blockage, staining. Mid/Late phase findings include blockage, staining (Patchy staining nasal mac, central blockage; ?CNV).   Left Eye Progression has no prior data. Early phase findings include normal observations. Mid/Late phase findings include normal observations (No CNV). Choroidal neovascularization is not present.   Notes **Images stored on drive**  Impression: OD: Patchy staining nasal mac, central blockage.  ?CNV OS: No CNV            ASSESSMENT/PLAN:    ICD-10-CM   1. Intermediate stage nonexudative age-related macular degeneration of both eyes  H35.3132 OCT, Retina - OU - Both Eyes    Fluorescein Angiography Optos (Transit OD)    2. Essential hypertension  I10     3. Hypertensive retinopathy of both eyes  H35.033 OCT, Retina - OU - Both Eyes    Fluorescein Angiography Optos (Transit OD)    4. Age-related cataract of both eyes, unspecified age-related cataract type  H25.9      Age related macular degeneration, non-exudative, OU - intermediate stage OU - The incidence, anatomy, and pathology of dry AMD, risk of progression, and the AREDS and AREDS 2 studies including smoking risks discussed with patient. - OCT impression OD: Mild peripapillary IRF nasal mac, central vitelliform like lesion w/ surrounding ORA, mild dusen. OS: Mild PVD - FA (1.22.24) OD: Patchy staining nasal mac, central blockage. ?CNV, OS: No CNV  - Recommend amsler grid monitoring  - will hold off on treatment at this time, but will follow up closely  - f/u 6-8 weeks -- DFE/OCT  2,3. Hypertensive retinopathy OU - discussed importance of tight BP control - monitor   4. Mixed Cataract OU - The symptoms of cataract, surgical options, and treatments and risks were discussed with patient. - discussed diagnosis and progression - BCVA 20/50 OU -- likely visually significant cataracts - monitor  Ophthalmic Meds Ordered this visit:  No orders of the defined types were placed in this encounter.    Return for 6-8weeks, non exu ARMD OU, DFE, OCT .  There are no Patient Instructions on file for this visit.   Explained the diagnoses, plan, and follow up with the patient and they expressed understanding.  Patient expressed understanding of the importance of proper follow up care.   This document serves as a record of  services personally performed by Gardiner Sleeper, MD, PhD. It was created on their behalf by Roselee Nova, COMT. The creation of this record is the provider's dictation and/or activities during the visit.  Electronically signed by: Roselee Nova, COMT 09/17/22 10:39 PM  This document serves as a record of services personally performed by Gardiner Sleeper, MD, PhD. It was created on their behalf by Orvan Falconer, an ophthalmic technician. The creation of this record is the provider's dictation and/or activities during the visit.    Electronically signed by: Orvan Falconer, OA, 09/17/22  10:39 PM   Gardiner Sleeper, M.D., Ph.D. Diseases & Surgery of the Retina and Vitreous [facility]  I have reviewed the above documentation for accuracy and completeness, and I agree with the above. Gardiner Sleeper, M.D., Ph.D. 09/17/22 10:40 PM   Abbreviations: M myopia (nearsighted); A astigmatism; H hyperopia (farsighted); P presbyopia; Mrx spectacle prescription;  CTL contact lenses; OD right eye; OS left eye; OU both eyes  XT exotropia; ET esotropia; PEK punctate epithelial keratitis; PEE punctate epithelial erosions; DES dry eye syndrome; MGD meibomian gland dysfunction; ATs artificial tears; PFAT's preservative free artificial tears; [place] nuclear sclerotic cataract; PSC posterior subcapsular cataract; ERM epi-retinal membrane; PVD posterior vitreous detachment; RD retinal detachment; DM diabetes mellitus; DR diabetic retinopathy; NPDR non-proliferative diabetic retinopathy; PDR proliferative diabetic retinopathy; CSME clinically significant macular edema; DME diabetic macular edema; dbh dot blot hemorrhages; CWS cotton wool spot; POAG primary open angle glaucoma; C/D cup-to-disc ratio; HVF humphrey visual field; GVF goldmann visual field; OCT optical coherence tomography; IOP intraocular pressure; BRVO Branch retinal vein occlusion; CRVO central retinal vein occlusion; CRAO central retinal artery occlusion; BRAO branch retinal artery occlusion; RT retinal tear; SB scleral buckle; PPV  pars plana vitrectomy; VH Vitreous hemorrhage; PRP panretinal laser photocoagulation; IVK intravitreal kenalog; VMT vitreomacular traction; MH Macular hole;  NVD neovascularization of the disc; NVE neovascularization elsewhere; AREDS age related eye disease study; ARMD age related macular degeneration; POAG primary open angle glaucoma; EBMD epithelial/anterior basement membrane dystrophy; ACIOL anterior chamber intraocular lens; IOL intraocular lens; PCIOL posterior chamber intraocular lens; Phaco/IOL phacoemulsification with intraocular lens placement; [place] photorefractive keratectomy; LASIK laser assisted in situ keratomileusis; HTN hypertension; DM diabetes mellitus; COPD chronic obstructive pulmonary disease

## 2022-09-16 ENCOUNTER — Ambulatory Visit (INDEPENDENT_AMBULATORY_CARE_PROVIDER_SITE_OTHER): Payer: Medicare Other | Admitting: Ophthalmology

## 2022-09-16 DIAGNOSIS — H35033 Hypertensive retinopathy, bilateral: Secondary | ICD-10-CM

## 2022-09-16 DIAGNOSIS — I10 Essential (primary) hypertension: Secondary | ICD-10-CM | POA: Diagnosis not present

## 2022-09-16 DIAGNOSIS — H259 Unspecified age-related cataract: Secondary | ICD-10-CM

## 2022-09-16 DIAGNOSIS — H3581 Retinal edema: Secondary | ICD-10-CM

## 2022-09-16 DIAGNOSIS — H353132 Nonexudative age-related macular degeneration, bilateral, intermediate dry stage: Secondary | ICD-10-CM | POA: Diagnosis not present

## 2022-09-17 ENCOUNTER — Encounter (INDEPENDENT_AMBULATORY_CARE_PROVIDER_SITE_OTHER): Payer: Self-pay | Admitting: Ophthalmology

## 2022-09-17 DIAGNOSIS — E1122 Type 2 diabetes mellitus with diabetic chronic kidney disease: Secondary | ICD-10-CM | POA: Diagnosis not present

## 2022-10-10 DIAGNOSIS — E782 Mixed hyperlipidemia: Secondary | ICD-10-CM | POA: Diagnosis not present

## 2022-10-10 DIAGNOSIS — K219 Gastro-esophageal reflux disease without esophagitis: Secondary | ICD-10-CM | POA: Diagnosis not present

## 2022-10-10 DIAGNOSIS — R809 Proteinuria, unspecified: Secondary | ICD-10-CM | POA: Diagnosis not present

## 2022-10-10 DIAGNOSIS — I10 Essential (primary) hypertension: Secondary | ICD-10-CM | POA: Diagnosis not present

## 2022-10-10 DIAGNOSIS — D72829 Elevated white blood cell count, unspecified: Secondary | ICD-10-CM | POA: Diagnosis not present

## 2022-10-10 DIAGNOSIS — N1831 Chronic kidney disease, stage 3a: Secondary | ICD-10-CM | POA: Diagnosis not present

## 2022-10-10 DIAGNOSIS — E1122 Type 2 diabetes mellitus with diabetic chronic kidney disease: Secondary | ICD-10-CM | POA: Diagnosis not present

## 2022-10-10 DIAGNOSIS — D509 Iron deficiency anemia, unspecified: Secondary | ICD-10-CM | POA: Diagnosis not present

## 2022-10-24 ENCOUNTER — Encounter: Payer: Self-pay | Admitting: Radiology

## 2022-10-29 NOTE — Progress Notes (Signed)
[facility] Note  11/04/2022     CHIEF COMPLAINT Patient presents for Retina Follow Up   HISTORY OF PRESENT ILLNESS: Alejandro Krueger is a 82 y.o. male who presents to the clinic today for:   HPI     Retina Follow Up   Patient presents with  Dry AMD.  In both eyes.  This started weeks ago.  Duration of 8 weeks.  I, the attending physician,  performed the HPI with the patient and updated documentation appropriately.        Comments   Patient feels that there has been no change in his vision. He is not using any eye drops at this time. He is taking the AREDS.      Last edited by Bernarda Caffey, MD on 11/04/2022 12:04 PM.    Pt states VA is stable, no new health problems  Referring physician: Celene Squibb, MD [place],  [place] [address]  HISTORICAL INFORMATION:   Selected notes from the MEDICAL RECORD NUMBER Referred by Dr. Jorja Loa for RPE changes OD LEE:  Ocular Hx- PMH-    CURRENT MEDICATIONS: No current outpatient medications on file. (Ophthalmic Drugs)   No current facility-administered medications for this visit. (Ophthalmic Drugs)   Current Outpatient Medications (Other)  Medication Sig   amLODipine (NORVASC) 5 MG tablet Take 5 mg by mouth in the morning.   buPROPion (WELLBUTRIN XL) 150 MG 24 hr tablet Take 150 mg by mouth in the morning.   ibuprofen (ADVIL) 800 MG tablet Take 1 tablet (800 mg total) by mouth every 8 (eight) hours as needed. (Patient not taking: Reported on 02/07/2022)   lisinopril (ZESTRIL) 20 MG tablet Take 20 mg by mouth in the morning.   omeprazole (PRILOSEC) 20 MG capsule Take 20 mg by mouth daily.   pravastatin (PRAVACHOL) 20 MG tablet Take 20 mg by mouth in the morning.   tamsulosin (FLOMAX) 0.4 MG CAPS capsule    No current facility-administered medications for this visit. (Other)   REVIEW OF SYSTEMS: ROS   Positive for: Eyes Last edited by Annie Paras, COT on 11/04/2022  8:53 AM.      ALLERGIES No Known Allergies  PAST MEDICAL HISTORY Past Medical History:  Diagnosis Date   GERD (gastroesophageal reflux disease)    Hx of adenomatous colonic polyps    Hypertension    Prostate cancer ([place])    Umbilical hernia with obstruction    Past Surgical History:  Procedure Laterality Date   COLONOSCOPY  06/23/2007   [id number] polyps removed (cecum and hepatic flexure). The remainder of the colonic mucosa appeared normal/Left-sided diverticula/Normal rectum (tubular adenomas)   COLONOSCOPY N/A 01/05/2014   Procedure: COLONOSCOPY;  Surgeon: Daneil Dolin, MD;  [facility];  Service: Endoscopy;  Laterality: N/A;  9:30   ESOPHAGOGASTRODUODENOSCOPY N/A 01/05/2014   Procedure: ESOPHAGOGASTRODUODENOSCOPY (EGD);  Surgeon: Daneil Dolin, MD;  [facility]: [facility];  Service: Endoscopy;  Laterality: N/A;   HARDWARE REMOVAL Right 11/17/2020   Procedure: RIGHT HIP HARDWARE REMOVAL;  Surgeon: Carole Civil, MD;  Location: [facility];  Service: Orthopedics;  Laterality: Right;   HIP PINNING,CANNULATED Right 11/09/2019   Procedure: CANNULATED HIP PINNING;  Surgeon: Carole Civil, MD;  Location: [facility];  Service: Orthopedics;  Laterality: Right;   KNEE ARTHROSCOPY  2003   right   prostate biopsy  2016   RADIOACTIVE SEED IMPLANT N/A 06/02/2015   Procedure: RADIOACTIVE SEED IMPLANT/BRACHYTHERAPY  IMPLANT;  Surgeon: Festus Aloe, MD;  Location: [facility];  Service: Urology;  Laterality: N/A;  63 seeds implanted no seeds in bladder   UMBILICAL HERNIA REPAIR N/A 02/12/2017   Procedure: HERNIA REPAIR UMBILICAL ADULT WITH MESH;  Surgeon: Aviva Signs, MD;  Location: [facility];  Service: General;  Laterality: N/A;   FAMILY HISTORY Family History  Problem Relation Age of Onset   Diabetes Mother    Heart attack Father    Colon cancer Neg Hx    SOCIAL HISTORY Social History   Tobacco Use   Smoking status: Never   Smokeless tobacco: Never  Vaping  Use   Vaping Use: Never used  Substance Use Topics   Alcohol use: No   Drug use: No       OPHTHALMIC EXAM:  Base Eye Exam     Visual Acuity (Snellen - Linear)       Right Left   Dist cc 20/30 -2 20/30 -1   Dist ph cc NI NI    Correction: Glasses         Tonometry (Tonopen, 8:57 AM)       Right Left   Pressure 15 17         Pupils       Dark Light Shape React APD   Right 3 2 Round Brisk None   Left 3 2 Round Brisk None         Visual Fields       Left Right    Full Full         Extraocular Movement       Right Left    Full, Ortho Full, Ortho         Neuro/Psych     Oriented x3: Yes   Mood/Affect: Normal         Dilation     Both eyes: 1.0% Mydriacyl, 2.5% Phenylephrine @ 8:53 AM           Slit Lamp and Fundus Exam     Slit Lamp Exam       Right Left   Lids/Lashes Dermatochalasis - upper lid, Meibomian gland dysfunction Dermatochalasis - upper lid, mild MGD   Conjunctiva/Sclera Inferior Conjunctivochalasis White and quiet, temp pinguecula   Cornea Trace tear film debris Mild tear film debris   Anterior Chamber Deep and Clear Moderate depth, narrow temporal angle, clear   Iris Round and poorly dilated to 66m Round and poorly dilated to 3.951m  Lens 2-3+ Nuclear sclerosis, 3+ Cortical cataract 2-3+ Nuclear sclerosis, 2-3+ Cortical cataract   Anterior Vitreous Mild syneresis Posterior vitreous detachment, Vitreous syneresis         Fundus Exam       Right Left   Disc Pink and sharp, Temporal PPA, mild peirpa Pink and sharp   C/D Ratio 0.6 0.6   Macula Flat, blunted foveal reflex, fine drusen, RPE mottling and clumping, mild peripapillary cystic changes nasal mac - improved, no heme Flat, blunted foveal reflex, drusen, RPE mottling, no heme or edema.   Vessels Attenuated, tortuous Attenuated, tortous   Periphery Attached, no heme Attached, inferior paving stone degeneration, No heme           Refraction     Wearing Rx        Sphere Cylinder Axis Add   Right +1.75 +1.25 010 +2.50   Left +1.50 +1.75 171            IMAGING AND PROCEDURES  Imaging  and Procedures for 11/04/2022  OCT, Retina - OU - Both Eyes       Right Eye Quality was good. Central Foveal Thickness: 304. Progression has improved. Findings include normal foveal contour, no SRF, retinal drusen , subretinal hyper-reflective material, intraretinal fluid, outer retinal atrophy, vitreomacular adhesion (Interval improvement in peripapillary IRF nasal mac, central vitelliform like lesion w/ interval improvement in surrounding ellipsoid signal / ORA, mild dusen).   Left Eye Quality was good. Central Foveal Thickness: 320. Progression has been stable. Findings include normal foveal contour, no IRF, no SRF (Partial PVD ).   Notes *Images captured and stored on drive  Diagnosis / Impression:  OD: Interval improvement in peripapillary IRF nasal mac, central vitelliform like lesion w/ interval improvement in surrounding ellipsoid signal / ORA, mild dusen OS: NFP; no IRF/SRF; partial PVD   Clinical management:  See below  Abbreviations: NFP - Normal foveal profile. CME - cystoid macular edema. PED - pigment epithelial detachment. IRF - intraretinal fluid. SRF - subretinal fluid. EZ - ellipsoid zone. ERM - epiretinal membrane. ORA - outer retinal atrophy. ORT - outer retinal tubulation. SRHM - subretinal hyper-reflective material. IRHM - intraretinal hyper-reflective material            ASSESSMENT/PLAN:    ICD-10-CM   1. Intermediate stage nonexudative age-related macular degeneration of both eyes  H35.3132 OCT, Retina - OU - Both Eyes    2. Essential hypertension  I10     3. Hypertensive retinopathy of both eyes  H35.033     4. Age-related cataract of both eyes, unspecified age-related cataract type  H25.9      Age related macular degeneration, non-exudative, OU - intermediate stage OU - OCT shows OD: Interval improvement in  peripapillary IRF nasal mac, central vitelliform like lesion w/ interval improvement in surrounding ORA, mild dusen; OS: NFP; no IRF/SRF; partial PVD - FA (1.22.24) OD: Patchy staining nasal mac, central blockage. ?CNV, OS: No CNV  - Recommend amsler grid monitoring  - will continue to hold off on treatment and monitor  - f/u 3 months -- DFE/OCT  2,3. Hypertensive retinopathy OU - discussed importance of tight BP control - monitor  4. Mixed Cataract OU - The symptoms of cataract, surgical options, and treatments and risks were discussed with patient. - discussed diagnosis and progression - BCVA 20/30 OU -- likely visually significant cataracts - will refer to [facility] for consult  Ophthalmic Meds Ordered this visit:  No orders of the defined types were placed in this encounter.    Return in about 3 months (around 02/04/2023) for f/u non-exu ARMD OU, DFE, OCT.  There are no Patient Instructions on file for this visit.   Explained the diagnoses, plan, and follow up with the patient and they expressed understanding.  Patient expressed understanding of the importance of proper follow up care.   This document serves as a record of services personally performed by Gardiner Sleeper, MD, PhD. It was created on their behalf by Roselee Nova, COMT. The creation of this record is the provider's dictation and/or activities during the visit.  Electronically signed by: Roselee Nova, COMT 11/04/22 12:06 PM  This document serves as a record of services personally performed by Gardiner Sleeper, MD, PhD. It was created on their behalf by San Jetty. Owens Shark, OA an ophthalmic technician. The creation of this record is the provider's dictation and/or activities during the visit.    Electronically signed by: San Jetty. Owens Shark, [place] 03.11.2024 12:06 PM  Gardiner Sleeper, M.D., Ph.D. Diseases & Surgery of the Retina and Vitreous [facility]  I have reviewed the above  documentation for accuracy and completeness, and I agree with the above. Gardiner Sleeper, M.D., Ph.D. 11/04/22 12:08 PM  Abbreviations: M myopia (nearsighted); A astigmatism; H hyperopia (farsighted); P presbyopia; Mrx spectacle prescription;  CTL contact lenses; OD right eye; OS left eye; OU both eyes  XT exotropia; ET esotropia; PEK punctate epithelial keratitis; PEE punctate epithelial erosions; DES dry eye syndrome; MGD meibomian gland dysfunction; ATs artificial tears; PFAT's preservative free artificial tears; [place] nuclear sclerotic cataract; PSC posterior subcapsular cataract; ERM epi-retinal membrane; PVD posterior vitreous detachment; RD retinal detachment; DM diabetes mellitus; DR diabetic retinopathy; NPDR non-proliferative diabetic retinopathy; PDR proliferative diabetic retinopathy; CSME clinically significant macular edema; DME diabetic macular edema; dbh dot blot hemorrhages; CWS cotton wool spot; POAG primary open angle glaucoma; C/D cup-to-disc ratio; HVF humphrey visual field; GVF goldmann visual field; OCT optical coherence tomography; IOP intraocular pressure; BRVO Branch retinal vein occlusion; CRVO central retinal vein occlusion; CRAO central retinal artery occlusion; BRAO branch retinal artery occlusion; RT retinal tear; SB scleral buckle; PPV pars plana vitrectomy; VH Vitreous hemorrhage; PRP panretinal laser photocoagulation; IVK intravitreal kenalog; VMT vitreomacular traction; MH Macular hole;  NVD neovascularization of the disc; NVE neovascularization elsewhere; AREDS age related eye disease study; ARMD age related macular degeneration; POAG primary open angle glaucoma; EBMD epithelial/anterior basement membrane dystrophy; ACIOL anterior chamber intraocular lens; IOL intraocular lens; PCIOL posterior chamber intraocular lens; Phaco/IOL phacoemulsification with intraocular lens placement; [place] photorefractive keratectomy; LASIK laser assisted in situ keratomileusis; HTN hypertension; DM  diabetes mellitus; COPD chronic obstructive pulmonary disease

## 2022-11-04 ENCOUNTER — Ambulatory Visit (INDEPENDENT_AMBULATORY_CARE_PROVIDER_SITE_OTHER): Payer: Medicare Other | Admitting: Ophthalmology

## 2022-11-04 ENCOUNTER — Encounter (INDEPENDENT_AMBULATORY_CARE_PROVIDER_SITE_OTHER): Payer: Self-pay | Admitting: Ophthalmology

## 2022-11-04 DIAGNOSIS — H35033 Hypertensive retinopathy, bilateral: Secondary | ICD-10-CM

## 2022-11-04 DIAGNOSIS — H353132 Nonexudative age-related macular degeneration, bilateral, intermediate dry stage: Secondary | ICD-10-CM | POA: Diagnosis not present

## 2022-11-04 DIAGNOSIS — I10 Essential (primary) hypertension: Secondary | ICD-10-CM | POA: Diagnosis not present

## 2022-11-04 DIAGNOSIS — H259 Unspecified age-related cataract: Secondary | ICD-10-CM

## 2022-11-11 DIAGNOSIS — H40013 Open angle with borderline findings, low risk, bilateral: Secondary | ICD-10-CM | POA: Diagnosis not present

## 2022-11-11 DIAGNOSIS — H353112 Nonexudative age-related macular degeneration, right eye, intermediate dry stage: Secondary | ICD-10-CM | POA: Diagnosis not present

## 2022-11-11 DIAGNOSIS — H2513 Age-related nuclear cataract, bilateral: Secondary | ICD-10-CM | POA: Diagnosis not present

## 2022-12-05 DIAGNOSIS — H2511 Age-related nuclear cataract, right eye: Secondary | ICD-10-CM | POA: Diagnosis not present

## 2022-12-06 ENCOUNTER — Encounter (HOSPITAL_COMMUNITY)
Admission: RE | Admit: 2022-12-06 | Discharge: 2022-12-06 | Disposition: A | Discharge status: Home / Self Care | Payer: Medicare Other | Source: Ambulatory Visit | Attending: Ophthalmology | Admitting: Ophthalmology

## 2022-12-06 HISTORY — DX: Type 2 diabetes mellitus without complications: E11.9

## 2022-12-06 NOTE — Pre-Procedure Instructions (Signed)
Attempted pre-op phone call. Left VM for him to call us back. 

## 2022-12-09 ENCOUNTER — Other Ambulatory Visit: Payer: Self-pay

## 2022-12-09 ENCOUNTER — Encounter (HOSPITAL_COMMUNITY): Payer: Self-pay

## 2022-12-09 NOTE — Progress Notes (Signed)
   12/09/22 1106  OBSTRUCTIVE SLEEP APNEA  Have you ever been diagnosed with sleep apnea through a sleep study? No  Do you snore loudly (loud enough to be heard through closed doors)?  1  Do you often feel tired, fatigued, or sleepy during the daytime (such as falling asleep during driving or talking to someone)? 0  Has anyone observed you stop breathing during your sleep? 0  Do you have, or are you being treated for high blood pressure? 1  BMI more than 35 kg/m2? 0  Age > 50 (1-yes) 1  Neck circumference greater than:Male 16 inches or larger, Male 17inches or larger? 0  Male Gender (Yes=1) 1  Obstructive Sleep Apnea Score 4  Score 5 or greater  Results sent to PCP

## 2022-12-10 NOTE — H&P (Signed)
Surgical History & Physical  Patient Name: Alejandro Krueger DOB: Dec 19, 1940  Surgery: Cataract extraction with intraocular lens implant phacoemulsification; Right Eye  Surgeon: Fabio Pierce MD Surgery Date:  12-13-22 Pre-Op Date:  11-25-22  HPI: A 41 Yr. old male patient is referred by Dr Vanessa Barbara from [facility] for cataract eval. 1. 1. The patient complains of difficulty when seeing street signs//c/o blurry vision, which began 1 year ago. Both eyes are affected. The episode is gradual. The condition's severity increased since last visit. Symptoms occur when the patient is driving and outside. The complaint is associated with glare. This is negatively affecting the patient's quality of life and the patient is unable to function adequately in life with the current level of vision. HPI Completed by Dr. Fabio Pierce  Medical History: Macula Degeneration Cataracts hypertensive retinopathy OU Cancer GERD, hx adenomatous colonic polyps, umbilical her... High Blood Pressure  Review of Systems Negative Allergic/Immunologic Negative Cardiovascular Negative Constitutional Negative Ear, Nose, Mouth & Throat Negative Endocrine Negative Eyes Negative Gastrointestinal Negative Genitourinary Negative Hemotologic/Lymphatic Negative Integumentary Negative Musculoskeletal Negative Neurological Negative Psychiatry Negative Respiratory  Social   Never smoked   Medication Amlodipine, Bupropion HCL, Lisinopril, Omeprazole, Pravastatin, Tamsulosin, Metformin,  Sx/Procedures Hip Sx, Hernia repair, Radioactive seed implant,   Drug Allergies   NKDA  History & Physical: Heent: cataract, right eye NECK: supple without bruits LUNGS: lungs clear to auscultation CV: regular rate and rhythm Abdomen: soft and non-tender Impression & Plan: Assessment: 1.  NUCLEAR SCLEROSIS AGE RELATED; Both Eyes (H25.13) 2.  AGE-RELATED MACULAR DEGENERATION DRY; Right Eye Intermediate (H35.3112) 3.  OAG  BORDERLINE FINDINGS LOW RISK; Both Eyes (H40.013) 4.  ASTIGMATISM, REGULAR; Both Eyes (H52.223) 5.  BLEPHARITIS; Right Upper Lid, Right Lower Lid, Left Upper Lid, Left Lower Lid (H01.001, H01.002,H01.004,H01.005) 6.  CONJUNCTIVOCHALASIS; Both Eyes (H11.823) 7.  Pinguecula; Both Eyes (H11.153)  Plan: 1.  Cataract accounts for the patient's decreased vision. This visual impairment is not correctable with a tolerable change in glasses or contact lenses. Cataract surgery with an implantation of a new lens should significantly improve the visual and functional status of the patient. Discussed all risks, benefits, alternatives, and potential complications. Discussed the procedures and recovery. Patient desires to have surgery. A-scan ordered and performed today for intra-ocular lens calculations. The surgery will be performed in order to improve vision for driving, reading, and for eye examinations. Recommend phacoemulsification with intra-ocular lens. Recommend Dextenza for post-operative pain and inflammation. Right Eye. worse - first. Dilates poorly - shugarcaine by protocol. Malyugin Ring. Omidira.  2.  under the care of Dr. Vanessa Barbara. AREDS 2 vitamins. Amsler grid testing weekly.  3.  Based on cup-to-disc ratio. Negative Family history. OCT rNFL shows: WNL OU. IOPs high normal today. Cataract surgery often helps lower IOP. Detailed discussion about glaucoma today including importance of maintaining good follow up and following treatment plan, and the possibility of irreversible blindness as part of this disease process.  4.  Worse OS - recommend toric IOL OS only.  5.  Recommend regular lid cleaning. Warm compresses 7-10 minutes every day, both eyes.  6.  Discussed condition and symptoms and is considered a type of Dry Eye Disease called "mechanical dry eye" caused by excessive tissue on the eye that is rubbed by the eyelids.  7.  Observe; Artificial tears as needed for irritation.

## 2022-12-13 ENCOUNTER — Ambulatory Visit (HOSPITAL_BASED_OUTPATIENT_CLINIC_OR_DEPARTMENT_OTHER): Payer: Medicare Other | Admitting: Anesthesiology

## 2022-12-13 ENCOUNTER — Encounter (HOSPITAL_COMMUNITY)
Admission: RE | Disposition: A | Discharge status: Home / Self Care | Payer: Self-pay | Source: Home / Self Care | Attending: Ophthalmology

## 2022-12-13 ENCOUNTER — Ambulatory Visit (HOSPITAL_COMMUNITY)
Admission: RE | Admit: 2022-12-13 | Discharge: 2022-12-13 | Disposition: A | Discharge status: Home / Self Care | Payer: Medicare Other | Attending: Ophthalmology | Admitting: Ophthalmology

## 2022-12-13 ENCOUNTER — Ambulatory Visit (HOSPITAL_COMMUNITY): Payer: Medicare Other | Admitting: Anesthesiology

## 2022-12-13 ENCOUNTER — Encounter (HOSPITAL_COMMUNITY): Payer: Self-pay | Admitting: Ophthalmology

## 2022-12-13 DIAGNOSIS — H0100A Unspecified blepharitis right eye, upper and lower eyelids: Secondary | ICD-10-CM | POA: Insufficient documentation

## 2022-12-13 DIAGNOSIS — I10 Essential (primary) hypertension: Secondary | ICD-10-CM | POA: Diagnosis not present

## 2022-12-13 DIAGNOSIS — H5711 Ocular pain, right eye: Secondary | ICD-10-CM

## 2022-12-13 DIAGNOSIS — H52223 Regular astigmatism, bilateral: Secondary | ICD-10-CM | POA: Diagnosis not present

## 2022-12-13 DIAGNOSIS — Z7984 Long term (current) use of oral hypoglycemic drugs: Secondary | ICD-10-CM

## 2022-12-13 DIAGNOSIS — H2181 Floppy iris syndrome: Secondary | ICD-10-CM

## 2022-12-13 DIAGNOSIS — G709 Myoneural disorder, unspecified: Secondary | ICD-10-CM | POA: Diagnosis not present

## 2022-12-13 DIAGNOSIS — E1149 Type 2 diabetes mellitus with other diabetic neurological complication: Secondary | ICD-10-CM

## 2022-12-13 DIAGNOSIS — Z79899 Other long term (current) drug therapy: Secondary | ICD-10-CM | POA: Diagnosis not present

## 2022-12-13 DIAGNOSIS — H2511 Age-related nuclear cataract, right eye: Secondary | ICD-10-CM

## 2022-12-13 DIAGNOSIS — E1136 Type 2 diabetes mellitus with diabetic cataract: Secondary | ICD-10-CM | POA: Insufficient documentation

## 2022-12-13 DIAGNOSIS — H353112 Nonexudative age-related macular degeneration, right eye, intermediate dry stage: Secondary | ICD-10-CM | POA: Diagnosis not present

## 2022-12-13 DIAGNOSIS — H11153 Pinguecula, bilateral: Secondary | ICD-10-CM | POA: Insufficient documentation

## 2022-12-13 DIAGNOSIS — H11823 Conjunctivochalasis, bilateral: Secondary | ICD-10-CM | POA: Insufficient documentation

## 2022-12-13 DIAGNOSIS — H0100B Unspecified blepharitis left eye, upper and lower eyelids: Secondary | ICD-10-CM | POA: Insufficient documentation

## 2022-12-13 DIAGNOSIS — Z8546 Personal history of malignant neoplasm of prostate: Secondary | ICD-10-CM | POA: Diagnosis not present

## 2022-12-13 DIAGNOSIS — N319 Neuromuscular dysfunction of bladder, unspecified: Secondary | ICD-10-CM | POA: Insufficient documentation

## 2022-12-13 DIAGNOSIS — K219 Gastro-esophageal reflux disease without esophagitis: Secondary | ICD-10-CM | POA: Insufficient documentation

## 2022-12-13 LAB — GLUCOSE, CAPILLARY: Glucose-Capillary: 117 mg/dL — ABNORMAL HIGH (ref 70–99)

## 2022-12-13 SURGERY — CATARACT EXTRACTION PHACO AND INTRAOCULAR LENS PLACEMENT (IOC) with placement of Corticosteroid
Anesthesia: Monitor Anesthesia Care | Site: Eye | Laterality: Right

## 2022-12-13 MED ORDER — PHENYLEPHRINE HCL 2.5 % OP SOLN
1.0000 [drp] | OPHTHALMIC | Status: AC | PRN
  Administered 2022-12-13 (×3): 1 [drp] via OPHTHALMIC

## 2022-12-13 MED ORDER — POVIDONE-IODINE 5 % OP SOLN
OPHTHALMIC | Status: DC | PRN
  Administered 2022-12-13: 1 via OPHTHALMIC

## 2022-12-13 MED ORDER — STERILE WATER FOR IRRIGATION IR SOLN
Status: DC | PRN
  Administered 2022-12-13: 250 mL

## 2022-12-13 MED ORDER — DEXAMETHASONE 0.4 MG OP INST
VAGINAL_INSERT | OPHTHALMIC | Status: AC
Start: 2022-12-13 — End: ?
  Filled 2022-12-13: qty 1

## 2022-12-13 MED ORDER — MIDAZOLAM HCL 5 MG/5ML IJ SOLN
INTRAMUSCULAR | Status: DC | PRN
  Administered 2022-12-13: .5 mg via INTRAVENOUS

## 2022-12-13 MED ORDER — LIDOCAINE HCL 3.5 % OP GEL
1.0000 | Freq: Once | OPHTHALMIC | Status: AC
  Administered 2022-12-13: 1 via OPHTHALMIC

## 2022-12-13 MED ORDER — DEXAMETHASONE 0.4 MG OP INST
VAGINAL_INSERT | OPHTHALMIC | Status: DC | PRN
  Administered 2022-12-13: .4 mg via OPHTHALMIC

## 2022-12-13 MED ORDER — MIDAZOLAM HCL 2 MG/2ML IJ SOLN
INTRAMUSCULAR | Status: AC
  Filled 2022-12-13: qty 2

## 2022-12-13 MED ORDER — LIDOCAINE HCL (PF) 1 % IJ SOLN
INTRAOCULAR | Status: DC | PRN
  Administered 2022-12-13: 1 mL via OPHTHALMIC

## 2022-12-13 MED ORDER — SODIUM HYALURONATE 10 MG/ML IO SOLUTION
PREFILLED_SYRINGE | INTRAOCULAR | Status: DC | PRN
  Administered 2022-12-13: .85 mL via INTRAOCULAR

## 2022-12-13 MED ORDER — SODIUM CHLORIDE 0.9% FLUSH
INTRAVENOUS | Status: DC | PRN
  Administered 2022-12-13: 3 mL via INTRAVENOUS

## 2022-12-13 MED ORDER — TROPICAMIDE 1 % OP SOLN
1.0000 [drp] | OPHTHALMIC | Status: AC | PRN
  Administered 2022-12-13 (×3): 1 [drp] via OPHTHALMIC

## 2022-12-13 MED ORDER — SODIUM HYALURONATE 23MG/ML IO SOSY
PREFILLED_SYRINGE | INTRAOCULAR | Status: DC | PRN
  Administered 2022-12-13: .6 mL via INTRAOCULAR

## 2022-12-13 MED ORDER — BSS IO SOLN
INTRAOCULAR | Status: DC | PRN
  Administered 2022-12-13: 15 mL via INTRAOCULAR

## 2022-12-13 MED ORDER — TETRACAINE HCL 0.5 % OP SOLN
1.0000 [drp] | OPHTHALMIC | Status: AC | PRN
Start: 2022-12-13 — End: 2022-12-13
  Administered 2022-12-13 (×3): 1 [drp] via OPHTHALMIC

## 2022-12-13 MED ORDER — PHENYLEPHRINE-KETOROLAC 1-0.3 % IO SOLN
INTRAOCULAR | Status: AC
  Filled 2022-12-13: qty 4

## 2022-12-13 MED ORDER — PHENYLEPHRINE-KETOROLAC 1-0.3 % IO SOLN
INTRAOCULAR | Status: DC | PRN
  Administered 2022-12-13: 500 mL via OPHTHALMIC

## 2022-12-13 SURGICAL SUPPLY — 14 items
CATARACT SUITE SIGHTPATH (MISCELLANEOUS) ×1 IMPLANT
CLOTH BEACON ORANGE TIMEOUT ST (SAFETY) ×1 IMPLANT
EYE SHIELD UNIVERSAL CLEAR (GAUZE/BANDAGES/DRESSINGS) IMPLANT
FEE CATARACT SUITE SIGHTPATH (MISCELLANEOUS) ×1 IMPLANT
GLOVE BIOGEL PI IND STRL 7.0 (GLOVE) ×2 IMPLANT
LENS IOL RAYNER 20.5 (Intraocular Lens) ×1 IMPLANT
LENS IOL RAYONE EMV 20.5 (Intraocular Lens) IMPLANT
NDL HYPO 18GX1.5 BLUNT FILL (NEEDLE) ×1 IMPLANT
NEEDLE HYPO 18GX1.5 BLUNT FILL (NEEDLE) ×1 IMPLANT
PAD ARMBOARD 7.5X6 YLW CONV (MISCELLANEOUS) ×1 IMPLANT
RING MALYGIN 7.0 (MISCELLANEOUS) IMPLANT
SYR TB 1ML LL NO SAFETY (SYRINGE) ×1 IMPLANT
TAPE SURG TRANSPORE 1 IN (GAUZE/BANDAGES/DRESSINGS) IMPLANT
WATER STERILE IRR 250ML POUR (IV SOLUTION) ×1 IMPLANT

## 2022-12-13 NOTE — Anesthesia Preprocedure Evaluation (Signed)
Anesthesia Evaluation  Patient identified by MRN, date of birth, ID band Patient awake    Reviewed: Allergy & Precautions, H&P , NPO status , Patient's Chart, lab work & pertinent test results  Airway Mallampati: III  TM Distance: >3 FB Neck ROM: Full    Dental  (+) Dental Advisory Given, Missing   Pulmonary neg pulmonary ROS   Pulmonary exam normal breath sounds clear to auscultation       Cardiovascular Exercise Tolerance: Good hypertension, Pt. on medications Normal cardiovascular exam Rhythm:Regular Rate:Normal     Neuro/Psych  Neuromuscular disease  negative psych ROS   GI/Hepatic Neg liver ROS,GERD  Medicated,,  Endo/Other  diabetes, Well Controlled, Type 2, Oral Hypoglycemic Agents    Renal/GU negative Renal ROS Bladder dysfunction (prostate cancer)      Musculoskeletal negative musculoskeletal ROS (+)    Abdominal   Peds negative pediatric ROS (+)  Hematology negative hematology ROS (+)   Anesthesia Other Findings   Reproductive/Obstetrics negative OB ROS                             Anesthesia Physical Anesthesia Plan  ASA: 2  Anesthesia Plan: MAC   Post-op Pain Management: Minimal or no pain anticipated   Induction:   PONV Risk Score and Plan: Treatment may vary due to age or medical condition  Airway Management Planned: Nasal Cannula and Natural Airway  Additional Equipment:   Intra-op Plan:   Post-operative Plan:   Informed Consent: I have reviewed the patients History and Physical, chart, labs and discussed the procedure including the risks, benefits and alternatives for the proposed anesthesia with the patient or authorized representative who has indicated his/her understanding and acceptance.     Dental advisory given  Plan Discussed with: CRNA and Surgeon  Anesthesia Plan Comments:         Anesthesia Quick Evaluation

## 2022-12-13 NOTE — Anesthesia Postprocedure Evaluation (Signed)
Anesthesia Post Note  Patient: TAEVON ASCHOFF  Procedure(s) Performed: CATARACT EXTRACTION PHACO AND INTRAOCULAR LENS PLACEMENT (IOC) with placement of Corticosteroid (Right: Eye)  Patient location during evaluation: Phase II Anesthesia Type: MAC Level of consciousness: awake and alert and oriented Pain management: pain level controlled Vital Signs Assessment: post-procedure vital signs reviewed and stable Respiratory status: spontaneous breathing, nonlabored ventilation and respiratory function stable Cardiovascular status: blood pressure returned to baseline and stable Postop Assessment: no apparent nausea or vomiting Anesthetic complications: no  No notable events documented.   Last Vitals:  Vitals:   12/13/22 0753 12/13/22 0912  BP: (!) 145/78 (!) 147/79  Pulse:  62  Resp:  12  Temp:  (!) 36.4 C  SpO2:  100%    Last Pain:  Vitals:   12/13/22 0912  TempSrc:   PainSc: 0-No pain                 Brynn Mulgrew C Avett Reineck

## 2022-12-13 NOTE — Op Note (Signed)
Pre-operative diagnosis: Visually significant age-related nuclear cataract, Right Eye (H25.11), Poor dilation of the right eye  Post-operative diagnosis:  Visually significant age-related nuclear cataract, Right Eye (H25.11) Intra-operative floppy iris syndrome, Right eye (H21.81) 3.   Pain and inflammation following cataract surgery, Right Eye (H57.11)  Procedure:  Complex Removal of cataract via phacoemulsification and insertion of intra-ocular lens Rayner [id number] 20.5D into the capsular bag of the Right Eye ([address]) 2. Placement of Dextenza Implant, Right Lower Lid  Attending surgeon: Rudy Jew. Harlem Bula, MD, MA  Anesthesia: MAC, Topical Akten  Complications: None  Estimated Blood Loss: <2mL (minimal)  Specimens: None  Implants: As above  Indications:  Visually significant age-related cataract, Right Eye  Procedure:  The patient was seen and identified in the pre-operative area. The operative eye was identified and dilated.  The operative eye was marked.  Topical anesthesia was administered to the operative eye.     The patient was then to the operative suite and placed in the supine position.  A timeout was performed confirming the patient, procedure to be performed, and all other relevant information.   The patient's face was prepped and draped in the usual fashion for intra-ocular surgery.  A lid speculum was placed into the operative eye and the surgical microscope moved into place and focused. Poor dilation of the iris was confirmed. An superotemporal paracentesis was created using a 20 gauge paracentesis blade.  Shugarcaine was injected into the anterior chamber.  Viscoelastic was injected into the anterior chamber.  A temporal clear-corneal main wound incision was created using a 2.56mm microkeratome.  A 7.19mm Malyugin ring was placed. A continuous curvilinear capsulorrhexis was initiated using an irrigating cystitome and completed using capsulorrhexis forceps.  Hydrodissection and  hydrodeliniation were performed.  Viscoelastic was injected into the anterior chamber.  A phacoemulsification handpiece and a chopper as a second instrument were used to remove the nucleus and epinucleus. The irrigation/aspiration handpiece was used to remove any remaining cortical material.   The capsular bag was reinflated with viscoelastic, checked, and found to be intact.  The intraocular lens was inserted into the capsular bag. The Malyugin ring was removed. The irrigation/aspiration handpiece was used to remove any remaining viscoelastic.  The clear corneal wound and paracentesis wounds were then hydrated and checked with Weck-Cels to be watertight.  The lid-speculum was removed.    The lower canaliculus was dilated and filled with Provisc. A Dextenza implant was placed in the lower canaliculus without complication.  The drape was removed.  The patient's face was cleaned with a wet and dry 4x4.   A clear shield was taped over the eye. The patient was taken to the post-operative care unit in good condition, having tolerated the procedure well.  Post-Op Instructions: The patient will follow up at [facility] for a same day post-operative evaluation and will receive all other orders and instructions.

## 2022-12-13 NOTE — Discharge Instructions (Signed)
Please discharge patient when stable, will follow up today with Dr. Bazil Dhanani at the [facility] [place] office immediately following discharge.  Leave shield in place until visit.  All paperwork with discharge instructions will be given at the office.  [facility] [place] Address:  [address]  [place], [place] [address]  

## 2022-12-13 NOTE — Interval H&P Note (Signed)
History and Physical Interval Note:  12/13/2022 8:43 AM  Alejandro Krueger  has presented today for surgery, with the diagnosis of nuclear sclerosis age related cataract; right ocular pain and inflammation; right.  The various methods of treatment have been discussed with the patient and family. After consideration of risks, benefits and other options for treatment, the patient has consented to  Procedure(s): CATARACT EXTRACTION PHACO AND INTRAOCULAR LENS PLACEMENT (IOC) with placement of Corticosteroid (Right) as a surgical intervention.  The patient's history has been reviewed, patient examined, no change in status, stable for surgery.  I have reviewed the patient's chart and labs.  Questions were answered to the patient's satisfaction.     Fabio Pierce

## 2022-12-13 NOTE — Transfer of Care (Signed)
Immediate Anesthesia Transfer of Care Note  Patient: Alejandro Krueger  Procedure(s) Performed: CATARACT EXTRACTION PHACO AND INTRAOCULAR LENS PLACEMENT (IOC) with placement of Corticosteroid (Right: Eye)  Patient Location: Short Stay  Anesthesia Type:MAC  Level of Consciousness: awake, alert , and oriented  Airway & Oxygen Therapy: Patient Spontanous Breathing  Post-op Assessment: Report given to RN, Post -op Vital signs reviewed and stable, Patient moving all extremities X 4, and Patient able to stick tongue midline  Post vital signs: Reviewed  Last Vitals:  Vitals Value Taken Time  BP 147/79   Temp 97.5   Pulse 61   Resp 10   SpO2 100     Last Pain:  Vitals:   12/13/22 0751  TempSrc: Oral  PainSc: 0-No pain         Complications: No notable events documented.

## 2022-12-19 DIAGNOSIS — H2512 Age-related nuclear cataract, left eye: Secondary | ICD-10-CM | POA: Diagnosis not present

## 2022-12-23 DIAGNOSIS — R030 Elevated blood-pressure reading, without diagnosis of hypertension: Secondary | ICD-10-CM | POA: Diagnosis not present

## 2022-12-23 DIAGNOSIS — M5431 Sciatica, right side: Secondary | ICD-10-CM | POA: Diagnosis not present

## 2022-12-24 ENCOUNTER — Encounter (HOSPITAL_COMMUNITY)
Admission: RE | Admit: 2022-12-24 | Discharge: 2022-12-24 | Disposition: A | Discharge status: Home / Self Care | Payer: Medicare Other | Source: Ambulatory Visit | Attending: Ophthalmology | Admitting: Ophthalmology

## 2022-12-24 DIAGNOSIS — H269 Unspecified cataract: Secondary | ICD-10-CM | POA: Diagnosis not present

## 2022-12-24 DIAGNOSIS — Z96649 Presence of unspecified artificial hip joint: Secondary | ICD-10-CM | POA: Diagnosis not present

## 2022-12-24 DIAGNOSIS — M5441 Lumbago with sciatica, right side: Secondary | ICD-10-CM | POA: Diagnosis not present

## 2022-12-24 NOTE — Pre-Procedure Instructions (Signed)
Attempted pre-op phone call. Left VM for him to call us back. 

## 2022-12-25 NOTE — H&P (Signed)
Surgical History & Physical  Patient Name: Alejandro Krueger DOB: 15-Nov-1940  Surgery: Cataract extraction with intraocular lens implant phacoemulsification; Left Eye  Surgeon: Fabio Pierce MD Surgery Date:  12-27-22 Pre-Op Date:  12-19-22  HPI: A 20 Yr. old male patient 1. The patient is returning after cataract post-op. The right eye is affected. Status post cataract post-op, which began 1 week ago: Since the last visit, the affected area is doing well. The patient's vision is improved. Patient is following medication instructions. The patient complains of difficulty when seeing street signs/c/o blurry vision, which began 1 year ago. Left eye is affected. The episode is gradual. The condition's severity increased since last visit. Symptoms occur when the patient is driving and outside. The complaint is associated with glare. This is negatively affecting the patient's quality of life and the patient is unable to function adequately in life with the current level of vision. HPI Completed by Dr. Fabio Pierce  Medical History: Macula Degeneration Cataracts hypertensive retinopathy OU Cancer GERD, hx adenomatous colonic polyps, umbilical her... High Blood Pressure  Review of Systems Negative Allergic/Immunologic Negative Cardiovascular Negative Constitutional Negative Ear, Nose, Mouth & Throat Negative Endocrine Negative Eyes Negative Gastrointestinal Negative Genitourinary Negative Hemotologic/Lymphatic Negative Integumentary Negative Musculoskeletal Negative Neurological Negative Psychiatry Negative Respiratory  Social   Never smoked   Medication Prednisolone-Moxifloxacin-Bromfenac,  Amlodipine, Bupropion HCL, Lisinopril, Omeprazole, Pravastatin, Tamsulosin, Metformin,   Sx/Procedures Phaco c IOL OD with Dextenza,  Hip Sx, Hernia repair, Radioactive seed implant,   Drug Allergies   NKDA  History & Physical: Heent: cataract, left eye NECK: supple without bruits LUNGS:  lungs clear to auscultation CV: regular rate and rhythm Abdomen: soft and non-tender Impression & Plan: Assessment: 1.  CATARACT EXTRACTION STATUS; Right Eye (Z98.41) 2.  NUCLEAR SCLEROSIS AGE RELATED; , Left Eye (H25.12) 3.  INTRAOCULAR LENS IOL (Z96.1) 4.  ASTIGMATISM, REGULAR; Both Eyes (H52.223)  Plan: 1.  1 week after cataract surgery. Doing well with improved vision and normal eye pressure. Call with any problems or concerns. Stop drops - Dextenza. Call with any concerning symptoms.  2.  Cataract accounts for the patient's decreased vision. This visual impairment is not correctable with a tolerable change in glasses or contact lenses. Cataract surgery with an implantation of a new lens should significantly improve the visual and functional status of the patient. Discussed all risks, benefits, alternatives, and potential complications. Discussed the procedures and recovery. Patient desires to have surgery. A-scan ordered and performed today for intra-ocular lens calculations. The surgery will be performed in order to improve vision for driving, reading, and for eye examinations. Recommend phacoemulsification with intra-ocular lens. Recommend Dextenza for post-operative pain and inflammation. Left Eye. Surgery required to correct imbalance of vision. Dilates poorly - shugarcaine by protocol. Malyugin Ring. Omidira.  3.  Doing well since surgery Continue Post-op medications  4.  Patient has significant astigmatism post-op OD, so standard IOL OS.

## 2022-12-26 ENCOUNTER — Encounter (HOSPITAL_COMMUNITY): Payer: Self-pay

## 2022-12-27 ENCOUNTER — Ambulatory Visit (HOSPITAL_COMMUNITY)
Admission: RE | Admit: 2022-12-27 | Discharge: 2022-12-27 | Disposition: A | Discharge status: Home / Self Care | Payer: Medicare Other | Attending: Ophthalmology | Admitting: Ophthalmology

## 2022-12-27 ENCOUNTER — Ambulatory Visit (HOSPITAL_COMMUNITY): Payer: Medicare Other | Admitting: Certified Registered Nurse Anesthetist

## 2022-12-27 ENCOUNTER — Ambulatory Visit (HOSPITAL_BASED_OUTPATIENT_CLINIC_OR_DEPARTMENT_OTHER): Payer: Medicare Other | Admitting: Certified Registered Nurse Anesthetist

## 2022-12-27 ENCOUNTER — Encounter (HOSPITAL_COMMUNITY): Payer: Self-pay | Admitting: Ophthalmology

## 2022-12-27 ENCOUNTER — Encounter (HOSPITAL_COMMUNITY)
Admission: RE | Disposition: A | Discharge status: Home / Self Care | Payer: Self-pay | Source: Home / Self Care | Attending: Ophthalmology

## 2022-12-27 DIAGNOSIS — H5712 Ocular pain, left eye: Secondary | ICD-10-CM | POA: Insufficient documentation

## 2022-12-27 DIAGNOSIS — E1149 Type 2 diabetes mellitus with other diabetic neurological complication: Secondary | ICD-10-CM

## 2022-12-27 DIAGNOSIS — E119 Type 2 diabetes mellitus without complications: Secondary | ICD-10-CM | POA: Diagnosis not present

## 2022-12-27 DIAGNOSIS — I10 Essential (primary) hypertension: Secondary | ICD-10-CM | POA: Diagnosis not present

## 2022-12-27 DIAGNOSIS — Z961 Presence of intraocular lens: Secondary | ICD-10-CM | POA: Diagnosis not present

## 2022-12-27 DIAGNOSIS — Z7984 Long term (current) use of oral hypoglycemic drugs: Secondary | ICD-10-CM | POA: Insufficient documentation

## 2022-12-27 DIAGNOSIS — C61 Malignant neoplasm of prostate: Secondary | ICD-10-CM | POA: Diagnosis not present

## 2022-12-27 DIAGNOSIS — K219 Gastro-esophageal reflux disease without esophagitis: Secondary | ICD-10-CM | POA: Insufficient documentation

## 2022-12-27 DIAGNOSIS — Z9841 Cataract extraction status, right eye: Secondary | ICD-10-CM | POA: Insufficient documentation

## 2022-12-27 DIAGNOSIS — H2181 Floppy iris syndrome: Secondary | ICD-10-CM | POA: Insufficient documentation

## 2022-12-27 DIAGNOSIS — H52223 Regular astigmatism, bilateral: Secondary | ICD-10-CM | POA: Diagnosis not present

## 2022-12-27 DIAGNOSIS — H2512 Age-related nuclear cataract, left eye: Secondary | ICD-10-CM | POA: Insufficient documentation

## 2022-12-27 DIAGNOSIS — N319 Neuromuscular dysfunction of bladder, unspecified: Secondary | ICD-10-CM | POA: Diagnosis not present

## 2022-12-27 DIAGNOSIS — E1136 Type 2 diabetes mellitus with diabetic cataract: Secondary | ICD-10-CM | POA: Diagnosis not present

## 2022-12-27 LAB — GLUCOSE, CAPILLARY: Glucose-Capillary: 92 mg/dL (ref 70–99)

## 2022-12-27 SURGERY — CATARACT EXTRACTION PHACO AND INTRAOCULAR LENS PLACEMENT (IOC) with placement of Corticosteroid
Anesthesia: Monitor Anesthesia Care | Site: Eye | Laterality: Left

## 2022-12-27 MED ORDER — TROPICAMIDE 1 % OP SOLN
1.0000 [drp] | OPHTHALMIC | Status: AC | PRN
  Administered 2022-12-27 (×3): 1 [drp] via OPHTHALMIC

## 2022-12-27 MED ORDER — FENTANYL CITRATE (PF) 100 MCG/2ML IJ SOLN
INTRAMUSCULAR | Status: DC | PRN
  Administered 2022-12-27: 25 ug via INTRAVENOUS

## 2022-12-27 MED ORDER — EPINEPHRINE PF 1 MG/ML IJ SOLN
INTRAMUSCULAR | Status: AC
  Filled 2022-12-27: qty 1

## 2022-12-27 MED ORDER — DEXAMETHASONE 0.4 MG OP INST
VAGINAL_INSERT | OPHTHALMIC | Status: AC
  Filled 2022-12-27: qty 1

## 2022-12-27 MED ORDER — POVIDONE-IODINE 5 % OP SOLN
OPHTHALMIC | Status: DC | PRN
  Administered 2022-12-27: 1 via OPHTHALMIC

## 2022-12-27 MED ORDER — STERILE WATER FOR IRRIGATION IR SOLN
Status: DC | PRN
  Administered 2022-12-27: 250 mL

## 2022-12-27 MED ORDER — DEXAMETHASONE 0.4 MG OP INST
VAGINAL_INSERT | OPHTHALMIC | Status: DC | PRN
  Administered 2022-12-27: .4 mg via OPHTHALMIC

## 2022-12-27 MED ORDER — SODIUM HYALURONATE 10 MG/ML IO SOLUTION
PREFILLED_SYRINGE | INTRAOCULAR | Status: DC | PRN
  Administered 2022-12-27: .85 mL via INTRAOCULAR

## 2022-12-27 MED ORDER — MIDAZOLAM HCL 2 MG/2ML IJ SOLN
INTRAMUSCULAR | Status: AC
  Filled 2022-12-27: qty 2

## 2022-12-27 MED ORDER — PHENYLEPHRINE-KETOROLAC 1-0.3 % IO SOLN
INTRAOCULAR | Status: AC
  Filled 2022-12-27: qty 4

## 2022-12-27 MED ORDER — LIDOCAINE HCL 3.5 % OP GEL
1.0000 | Freq: Once | OPHTHALMIC | Status: AC
  Administered 2022-12-27: 1 via OPHTHALMIC

## 2022-12-27 MED ORDER — MIDAZOLAM HCL 5 MG/5ML IJ SOLN
INTRAMUSCULAR | Status: DC | PRN
  Administered 2022-12-27: .5 mg via INTRAVENOUS

## 2022-12-27 MED ORDER — PHENYLEPHRINE HCL 2.5 % OP SOLN
1.0000 [drp] | OPHTHALMIC | Status: AC | PRN
  Administered 2022-12-27 (×3): 1 [drp] via OPHTHALMIC

## 2022-12-27 MED ORDER — SODIUM HYALURONATE 23MG/ML IO SOSY
PREFILLED_SYRINGE | INTRAOCULAR | Status: DC | PRN
  Administered 2022-12-27: .6 mL via INTRAOCULAR

## 2022-12-27 MED ORDER — PHENYLEPHRINE-KETOROLAC 1-0.3 % IO SOLN
INTRAOCULAR | Status: DC | PRN
  Administered 2022-12-27: 500 mL via OPHTHALMIC

## 2022-12-27 MED ORDER — FENTANYL CITRATE (PF) 100 MCG/2ML IJ SOLN
INTRAMUSCULAR | Status: AC
  Filled 2022-12-27: qty 2

## 2022-12-27 MED ORDER — TETRACAINE HCL 0.5 % OP SOLN
1.0000 [drp] | OPHTHALMIC | Status: AC | PRN
  Administered 2022-12-27 (×3): 1 [drp] via OPHTHALMIC

## 2022-12-27 MED ORDER — LIDOCAINE HCL (PF) 1 % IJ SOLN
INTRAOCULAR | Status: DC | PRN
  Administered 2022-12-27: 1 mL via OPHTHALMIC

## 2022-12-27 MED ORDER — SODIUM CHLORIDE 0.9% FLUSH
INTRAVENOUS | Status: DC | PRN
  Administered 2022-12-27: 10 mL via INTRAVENOUS

## 2022-12-27 MED ORDER — BSS IO SOLN
INTRAOCULAR | Status: DC | PRN
  Administered 2022-12-27: 15 mL via INTRAOCULAR

## 2022-12-27 SURGICAL SUPPLY — 15 items
CATARACT SUITE SIGHTPATH (MISCELLANEOUS) ×1 IMPLANT
CLOTH BEACON ORANGE TIMEOUT ST (SAFETY) ×1 IMPLANT
EYE SHIELD UNIVERSAL CLEAR (GAUZE/BANDAGES/DRESSINGS) IMPLANT
FEE CATARACT SUITE SIGHTPATH (MISCELLANEOUS) ×1 IMPLANT
GLOVE BIOGEL PI IND STRL 7.0 (GLOVE) ×2 IMPLANT
GLOVE SURG SS PI 6.5 STRL IVOR (GLOVE) IMPLANT
LENS IOL RAYNER 20.5 (Intraocular Lens) ×1 IMPLANT
LENS IOL RAYONE EMV 20.5 (Intraocular Lens) IMPLANT
NDL HYPO 18GX1.5 BLUNT FILL (NEEDLE) ×1 IMPLANT
NEEDLE HYPO 18GX1.5 BLUNT FILL (NEEDLE) ×1 IMPLANT
PAD ARMBOARD 7.5X6 YLW CONV (MISCELLANEOUS) ×1 IMPLANT
RING MALYGIN 7.0 (MISCELLANEOUS) IMPLANT
SYR TB 1ML LL NO SAFETY (SYRINGE) ×1 IMPLANT
TAPE SURG TRANSPORE 1 IN (GAUZE/BANDAGES/DRESSINGS) IMPLANT
WATER STERILE IRR 250ML POUR (IV SOLUTION) ×1 IMPLANT

## 2022-12-27 NOTE — Anesthesia Preprocedure Evaluation (Signed)
Anesthesia Evaluation  Patient identified by MRN, date of birth, ID band Patient awake    Reviewed: Allergy & Precautions, H&P , NPO status , Patient's Chart, lab work & pertinent test results  Airway Mallampati: III  TM Distance: >3 FB Neck ROM: Full    Dental  (+) Dental Advisory Given, Missing   Pulmonary neg pulmonary ROS   Pulmonary exam normal breath sounds clear to auscultation       Cardiovascular Exercise Tolerance: Good hypertension, Pt. on medications Normal cardiovascular exam Rhythm:Regular Rate:Normal     Neuro/Psych  Neuromuscular disease  negative psych ROS   GI/Hepatic Neg liver ROS,GERD  Medicated,,  Endo/Other  diabetes, Well Controlled, Type 2, Oral Hypoglycemic Agents    Renal/GU negative Renal ROS Bladder dysfunction (prostate cancer)      Musculoskeletal negative musculoskeletal ROS (+)    Abdominal   Peds negative pediatric ROS (+)  Hematology negative hematology ROS (+)   Anesthesia Other Findings   Reproductive/Obstetrics negative OB ROS                             Anesthesia Physical Anesthesia Plan  ASA: 2  Anesthesia Plan: MAC   Post-op Pain Management: Minimal or no pain anticipated   Induction:   PONV Risk Score and Plan: Treatment may vary due to age or medical condition  Airway Management Planned: Nasal Cannula and Natural Airway  Additional Equipment:   Intra-op Plan:   Post-operative Plan:   Informed Consent: I have reviewed the patients History and Physical, chart, labs and discussed the procedure including the risks, benefits and alternatives for the proposed anesthesia with the patient or authorized representative who has indicated his/her understanding and acceptance.     Dental advisory given  Plan Discussed with: CRNA and Surgeon  Anesthesia Plan Comments:         Anesthesia Quick Evaluation  

## 2022-12-27 NOTE — Discharge Instructions (Signed)
Please discharge patient when stable, will follow up today with Dr. Gill Delrossi at the [facility] [place] office immediately following discharge.  Leave shield in place until visit.  All paperwork with discharge instructions will be given at the office.  [facility] Address:  [address]  [place], [place] [address]  

## 2022-12-27 NOTE — Interval H&P Note (Signed)
History and Physical Interval Note:  12/27/2022 10:47 AM  Alejandro Krueger  has presented today for surgery, with the diagnosis of nuclear sclerosis age related cataract; left ocular pain and inflammation; left.  The various methods of treatment have been discussed with the patient and family. After consideration of risks, benefits and other options for treatment, the patient has consented to  Procedure(s) with comments: CATARACT EXTRACTION PHACO AND INTRAOCULAR LENS PLACEMENT (IOC) with placement of Corticosteroid (Left) - CDE: as a surgical intervention.  The patient's history has been reviewed, patient examined, no change in status, stable for surgery.  I have reviewed the patient's chart and labs.  Questions were answered to the patient's satisfaction.     Fabio Pierce

## 2022-12-27 NOTE — Op Note (Signed)
Date of procedure: 12/27/22  Pre-operative diagnosis:  Visually significant age-related nuclear cataract, Left Eye (H25.12) Poor dilation of the iris, left eye  Post-operative diagnosis:  Visually significant age-related nuclear cataract, Left Eye (H25.12) 2.   Pain and inflammation following cataract surgery, Left Eye (H57.12) 3.   Intra-operative floppy iris syndrome, left eye   Procedure:  Complex Removal of cataract via phacoemulsification and insertion of intra-ocular lens Rayner [id number] +20.5D into the capsular bag of the Left Eye [phone] 2. Placement of Dextenza Implant, Left Lower Lid  Attending surgeon: Rudy Jew. Neiva Maenza, MD, MA  Anesthesia: MAC, Topical Akten  Complications: None  Estimated Blood Loss: <33mL (minimal)  Specimens: None  Implants: As above  Indications:  Visually significant age-related cataract, Left Eye  Procedure:  The patient was seen and identified in the pre-operative area. The operative eye was identified and dilated.  The operative eye was marked.  Topical anesthesia was administered to the operative eye.     The patient was then to the operative suite and placed in the supine position.  A timeout was performed confirming the patient, procedure to be performed, and all other relevant information.   The patient's face was prepped and draped in the usual fashion for intra-ocular surgery.  A lid speculum was placed into the operative eye and the surgical microscope moved into place and focused. Very poor dilation of the left eye was confirmed.  An inferotemporal paracentesis was created using a 20 gauge paracentesis blade.  Shugarcaine was injected into the anterior chamber.  Viscoelastic was injected into the anterior chamber.  A temporal clear-corneal main wound incision was created using a 2.62mm microkeratome. A 7.69mm Malyugin ring was placed. A continuous curvilinear capsulorrhexis was initiated using an irrigating cystitome and completed using  capsulorrhexis forceps.  Hydrodissection and hydrodeliniation were performed.  Viscoelastic was injected into the anterior chamber.  A phacoemulsification handpiece and a chopper as a second instrument were used to remove the nucleus and epinucleus. The irrigation/aspiration handpiece was used to remove any remaining cortical material.   The capsular bag was reinflated with viscoelastic, checked, and found to be intact.  The intraocular lens was inserted into the capsular bag.The Malyugin ring was removed. The irrigation/aspiration handpiece was used to remove any remaining viscoelastic.  The clear corneal wound and paracentesis wounds were then hydrated and checked with Weck-Cels to be watertight.    The lid-speculum was removed. The lower punctum was dilated. A Dextenza implant was placed in the lower canaliculus without complication.   The drape was removed.  The patient's face was cleaned with a wet and dry 4x4.   A clear shield was taped over the eye. The patient was taken to the post-operative care unit in good condition, having tolerated the procedure well.  Post-Op Instructions: The patient will follow up at [facility] for a same day post-operative evaluation and will receive all other orders and instructions.

## 2022-12-27 NOTE — Transfer of Care (Signed)
Immediate Anesthesia Transfer of Care Note  Patient: Alejandro Krueger  Procedure(s) Performed: CATARACT EXTRACTION PHACO AND INTRAOCULAR LENS PLACEMENT (IOC) with placement of Corticosteroid (Left: Eye)  Patient Location: Short Stay  Anesthesia Type:MAC  Level of Consciousness: awake, alert , and oriented  Airway & Oxygen Therapy: Patient Spontanous Breathing  Post-op Assessment: Report given to RN, Post -op Vital signs reviewed and stable, Patient moving all extremities X 4, and Patient able to stick tongue midline  Post vital signs: Reviewed  Last Vitals:  Vitals Value Taken Time  BP 147/76   Temp 97.6   Pulse 59   Resp 12   SpO2 94     Last Pain:  Vitals:   12/27/22 1013  PainSc: 0-No pain         Complications: No notable events documented.

## 2022-12-29 NOTE — Anesthesia Postprocedure Evaluation (Signed)
Anesthesia Post Note  Patient: Alejandro Krueger  Procedure(s) Performed: CATARACT EXTRACTION PHACO AND INTRAOCULAR LENS PLACEMENT (IOC) with placement of Corticosteroid (Left: Eye)  Patient location during evaluation: Phase II Anesthesia Type: MAC Level of consciousness: awake Pain management: pain level controlled Vital Signs Assessment: post-procedure vital signs reviewed and stable Respiratory status: spontaneous breathing and respiratory function stable Cardiovascular status: blood pressure returned to baseline and stable Postop Assessment: no headache and no apparent nausea or vomiting Anesthetic complications: no Comments: Late entry   No notable events documented.   Last Vitals:  Vitals:   12/27/22 1015 12/27/22 1116  BP: 135/86 (!) 147/76  Pulse: 65 (!) 58  Resp: 16 14  Temp: 36.4 C (!) 36.4 C  SpO2: 99% 100%    Last Pain:  Vitals:   12/27/22 1116  TempSrc: Oral  PainSc: 0-No pain                 Windell Norfolk

## 2023-01-16 ENCOUNTER — Telehealth: Payer: Self-pay | Admitting: Physical Medicine and Rehabilitation

## 2023-01-16 NOTE — Telephone Encounter (Signed)
Patient's wife called needing to schedule an appointment with Dr. Alvester Morin for his back and right leg. The number to contact Renea Ee is [phone] or [phone]

## 2023-01-17 NOTE — Telephone Encounter (Signed)
Attempted to call patient's wife, mailbox full. Patient needs OV. Has not been seen since 2021

## 2023-01-21 DIAGNOSIS — E782 Mixed hyperlipidemia: Secondary | ICD-10-CM | POA: Diagnosis not present

## 2023-01-21 DIAGNOSIS — E1122 Type 2 diabetes mellitus with diabetic chronic kidney disease: Secondary | ICD-10-CM | POA: Diagnosis not present

## 2023-01-27 DIAGNOSIS — D509 Iron deficiency anemia, unspecified: Secondary | ICD-10-CM | POA: Diagnosis not present

## 2023-01-27 DIAGNOSIS — N1831 Chronic kidney disease, stage 3a: Secondary | ICD-10-CM | POA: Diagnosis not present

## 2023-01-27 DIAGNOSIS — H269 Unspecified cataract: Secondary | ICD-10-CM | POA: Diagnosis not present

## 2023-01-27 DIAGNOSIS — I10 Essential (primary) hypertension: Secondary | ICD-10-CM | POA: Diagnosis not present

## 2023-01-27 DIAGNOSIS — D72829 Elevated white blood cell count, unspecified: Secondary | ICD-10-CM | POA: Diagnosis not present

## 2023-01-27 DIAGNOSIS — M5441 Lumbago with sciatica, right side: Secondary | ICD-10-CM | POA: Diagnosis not present

## 2023-01-27 DIAGNOSIS — E1122 Type 2 diabetes mellitus with diabetic chronic kidney disease: Secondary | ICD-10-CM | POA: Diagnosis not present

## 2023-01-27 DIAGNOSIS — I129 Hypertensive chronic kidney disease with stage 1 through stage 4 chronic kidney disease, or unspecified chronic kidney disease: Secondary | ICD-10-CM | POA: Diagnosis not present

## 2023-01-27 DIAGNOSIS — E782 Mixed hyperlipidemia: Secondary | ICD-10-CM | POA: Diagnosis not present

## 2023-01-27 DIAGNOSIS — R809 Proteinuria, unspecified: Secondary | ICD-10-CM | POA: Diagnosis not present

## 2023-01-28 ENCOUNTER — Other Ambulatory Visit (HOSPITAL_COMMUNITY): Payer: Self-pay | Admitting: Neurosurgery

## 2023-01-28 DIAGNOSIS — M5440 Lumbago with sciatica, unspecified side: Secondary | ICD-10-CM

## 2023-02-05 NOTE — Progress Notes (Signed)
[facility] - Clinic Note  02/10/2023     CHIEF COMPLAINT Patient presents for Retina Follow Up   HISTORY OF PRESENT ILLNESS: Alejandro Krueger is a 82 y.o. male who presents to the clinic today for:   HPI     Retina Follow Up   Patient presents with  Dry AMD.  In both eyes.  This started 3 months ago.  Duration of 3 months.  Since onset it is stable.  I, the attending physician,  performed the HPI with the patient and updated documentation appropriately.        Comments   3 month retina follow up AMD pt is reporting vision has improved since having cataract surgery OD May and June OS       Last edited by Rennis Chris, MD on 02/10/2023  1:08 PM.    Pt had cataract sx OU with Dr. June Leap OU since he was here last  Referring physician: Benita Stabile, MD [address] [place]anne [place],  [place] [address]  HISTORICAL INFORMATION:   Selected notes from the MEDICAL RECORD NUMBER Referred by Dr. Charise Killian for RPE changes OD LEE:  Ocular Hx- PMH-    CURRENT MEDICATIONS: No current outpatient medications on file. (Ophthalmic Drugs)   No current facility-administered medications for this visit. (Ophthalmic Drugs)   Current Outpatient Medications (Other)  Medication Sig   amLODipine (NORVASC) 5 MG tablet Take 5 mg by mouth in the morning.   buPROPion (WELLBUTRIN XL) 150 MG 24 hr tablet Take 150 mg by mouth in the morning.   ibuprofen (ADVIL) 800 MG tablet Take 1 tablet (800 mg total) by mouth every 8 (eight) hours as needed.   lisinopril (ZESTRIL) 20 MG tablet Take 20 mg by mouth in the morning.   metformin (FORTAMET) 500 MG (OSM) 24 hr tablet Take 500 mg by mouth daily with breakfast.   omeprazole (PRILOSEC) 20 MG capsule Take 20 mg by mouth daily.   pravastatin (PRAVACHOL) 20 MG tablet Take 20 mg by mouth in the morning.   tamsulosin (FLOMAX) 0.4 MG CAPS capsule    No current facility-administered medications for this visit. (Other)   REVIEW OF SYSTEMS: ROS    Positive for: Eyes Last edited by Etheleen Mayhew, COT on 02/10/2023  8:53 AM.     ALLERGIES No Known Allergies  PAST MEDICAL HISTORY Past Medical History:  Diagnosis Date   Diabetes mellitus without complication (HCC)    GERD (gastroesophageal reflux disease)    Hx of adenomatous colonic polyps    Hypertension    Prostate cancer (HCC)    Umbilical hernia with obstruction    Past Surgical History:  Procedure Laterality Date   COLONOSCOPY  06/23/2007   UEA:[id number] polyps removed (cecum and hepatic flexure). The remainder of the colonic mucosa appeared normal/Left-sided diverticula/Normal rectum (tubular adenomas)   COLONOSCOPY N/A 01/05/2014   Procedure: COLONOSCOPY;  Surgeon: Corbin Ade, MD;  [facility]: [facility];  Service: Endoscopy;  Laterality: N/A;  9:30   ESOPHAGOGASTRODUODENOSCOPY N/A 01/05/2014   Procedure: ESOPHAGOGASTRODUODENOSCOPY (EGD);  Surgeon: Corbin Ade, MD;  Location: [facility];  Service: Endoscopy;  Laterality: N/A;   HARDWARE REMOVAL Right 11/17/2020   Procedure: RIGHT HIP HARDWARE REMOVAL;  Surgeon: Vickki Hearing, MD;  Location: [facility];  Service: Orthopedics;  Laterality: Right;   HIP PINNING,CANNULATED Right 11/09/2019   Procedure: CANNULATED HIP PINNING;  Surgeon: Vickki Hearing, MD;  Location: [facility];  Service: Orthopedics;  Laterality:  Right;   KNEE ARTHROSCOPY  2003   right   prostate biopsy  2016   RADIOACTIVE SEED IMPLANT N/A 06/02/2015   Procedure: RADIOACTIVE SEED IMPLANT/BRACHYTHERAPY IMPLANT;  Surgeon: Jerilee Field, MD;  Location: [facility];  Service: Urology;  Laterality: N/A;  63 seeds implanted no seeds in bladder   UMBILICAL HERNIA REPAIR N/A 02/12/2017   Procedure: HERNIA REPAIR UMBILICAL ADULT WITH MESH;  Surgeon: Franky Macho, MD;  Location: [facility];  Service: General;  Laterality: N/A;   FAMILY HISTORY Family History  Problem Relation Age of Onset   Diabetes Mother    Heart attack Father     Colon cancer Neg Hx    SOCIAL HISTORY Social History   Tobacco Use   Smoking status: Never   Smokeless tobacco: Never  Vaping Use   Vaping Use: Never used  Substance Use Topics   Alcohol use: No   Drug use: No       OPHTHALMIC EXAM:  Base Eye Exam     Visual Acuity (Snellen - Linear)       Right Left   Dist [place] 20/25 20/20 -2   Dist ph [place] 20/20          Tonometry (Tonopen, 8:56 AM)       Right Left   Pressure 16 18         Pupils       Pupils Dark Light Shape React APD   Right PERRL 3 2 Round Brisk None   Left PERRL 3 2 Round Brisk None         Visual Fields       Left Right    Full Full         Extraocular Movement       Right Left    Full, Ortho Full, Ortho         Neuro/Psych     Oriented x3: Yes   Mood/Affect: Normal         Dilation     Both eyes: 2.5% Phenylephrine @ 8:56 AM           Slit Lamp and Fundus Exam     Slit Lamp Exam       Right Left   Lids/Lashes Dermatochalasis - upper lid, Meibomian gland dysfunction Dermatochalasis - upper lid, mild MGD   Conjunctiva/Sclera Inferior Conjunctivochalasis White and quiet, temp pinguecula   Cornea Trace tear film debris, well healed cataract wound Mild tear film debris, well healed cataract wound   Anterior Chamber Deep and Clear Moderate depth, narrow temporal angle, clear   Iris Round and poorly dilated to 4mm Round and poorly dilated to 4.50mm, Transillumination defects at 0300   Lens PC IOL in good position PC IOL in good position   Anterior Vitreous Mild syneresis Posterior vitreous detachment, mild syneresis         Fundus Exam       Right Left   Disc Pink and sharp, Temporal PPA mild Pallor, Sharp rim   C/D Ratio 0.6 0.6   Macula Flat, blunted foveal reflex, fine drusen, RPE mottling and clumping, mild peripapillary cystic changes nasal mac - improved, no heme Flat, blunted foveal reflex, drusen, RPE mottling, no heme or edema.   Vessels Attenuated, copper  wiring, mild tortuosity, mild AV crossing changes Attenuated, copper wiring, mild tortuosity, mild AV crossing changes   Periphery Attached, no heme Attached, inferior paving stone degeneration, No heme  Refraction     Wearing Rx       Sphere Cylinder Axis Add   Right +1.75 +1.25 010 +2.50   Left +1.50 +1.75 171            IMAGING AND PROCEDURES  Imaging and Procedures for 02/10/2023  OCT, Retina - OU - Both Eyes       Right Eye Quality was good. Central Foveal Thickness: 324. Progression has improved. Findings include normal foveal contour, no SRF, retinal drusen , subretinal hyper-reflective material, intraretinal fluid, outer retinal atrophy, vitreomacular adhesion (Interval improvement in peripapillary IRF nasal mac, central vitelliform like lesion w/ stable improvement in surrounding ellipsoid signal / ORA, mild dusen).   Left Eye Quality was good. Central Foveal Thickness: 344. Progression has been stable. Findings include normal foveal contour, no IRF, no SRF (Partial PVD ).   Notes *Images captured and stored on drive  Diagnosis / Impression:  OD: Interval improvement in peripapillary IRF nasal mac, central vitelliform like lesion w/ interval improvement in surrounding ellipsoid signal / ORA, mild dusen OS: NFP; no IRF/SRF; partial PVD   Clinical management:  See below  Abbreviations: NFP - Normal foveal profile. CME - cystoid macular edema. PED - pigment epithelial detachment. IRF - intraretinal fluid. SRF - subretinal fluid. EZ - ellipsoid zone. ERM - epiretinal membrane. ORA - outer retinal atrophy. ORT - outer retinal tubulation. SRHM - subretinal hyper-reflective material. IRHM - intraretinal hyper-reflective material            ASSESSMENT/PLAN:    ICD-10-CM   1. Intermediate stage nonexudative age-related macular degeneration of both eyes  H35.3132 OCT, Retina - OU - Both Eyes    2. Essential hypertension  I10     3. Hypertensive  retinopathy of both eyes  H35.033     4. Pseudophakia, both eyes  Z96.1       Age related macular degeneration, non-exudative, OU - intermediate stage OU - OCT shows OD: Interval improvement in peripapillary IRF nasal mac, central vitelliform like lesion w/ interval improvement in surrounding ORA, mild dusen; OS: NFP; no IRF/SRF; partial PVD - FA (1.22.24) OD: Patchy staining nasal mac, central blockage. ?CNV, OS: No CNV  - continue amsler grid monitoring  - will continue to hold off on treatment and monitor  - f/u 4 months -- DFE/OCT  2,3. Hypertensive retinopathy OU - discussed importance of tight BP control - monitor  4. Pseudophakia OU  - s/p CE/IOL (Dr. Evonnie Pat: OD: 04.19.24, OS: 05.03.24)  - IOL in good position, doing well  - monitor   Ophthalmic Meds Ordered this visit:  No orders of the defined types were placed in this encounter.    Return in 4 months (on 06/12/2023) for non ex ARMD OU, Dilated Exam, OCT.  There are no Patient Instructions on file for this visit.   Explained the diagnoses, plan, and follow up with the patient and they expressed understanding.  Patient expressed understanding of the importance of proper follow up care.   This document serves as a record of services personally performed by Karie Chimera, MD, PhD. It was created on their behalf by Annalee Genta, COMT. The creation of this record is the provider's dictation and/or activities during the visit.  Electronically signed by: Annalee Genta, COMT 02/10/23 1:08 PM  This document serves as a record of services personally performed by Karie Chimera, MD, PhD. It was created on their behalf by Glee Arvin. Manson Passey, OA an ophthalmic technician. The creation of this  record is the provider's dictation and/or activities during the visit.    Electronically signed by: Glee Arvin. Manson Passey, [place] 06.17.2024 1:08 PM   Karie Chimera, M.D., Ph.D. Diseases & Surgery of the Retina and Vitreous [facility]  I have reviewed the above documentation for accuracy and completeness, and I agree with the above. Karie Chimera, M.D., Ph.D. 02/10/23 1:09 PM   Abbreviations: M myopia (nearsighted); A astigmatism; H hyperopia (farsighted); P presbyopia; Mrx spectacle prescription;  CTL contact lenses; OD right eye; OS left eye; OU both eyes  XT exotropia; ET esotropia; PEK punctate epithelial keratitis; PEE punctate epithelial erosions; DES dry eye syndrome; MGD meibomian gland dysfunction; ATs artificial tears; PFAT's preservative free artificial tears; NSC nuclear sclerotic cataract; PSC posterior subcapsular cataract; ERM epi-retinal membrane; PVD posterior vitreous detachment; RD retinal detachment; DM diabetes mellitus; DR diabetic retinopathy; NPDR non-proliferative diabetic retinopathy; PDR proliferative diabetic retinopathy; CSME clinically significant macular edema; DME diabetic macular edema; dbh dot blot hemorrhages; CWS cotton wool spot; POAG primary open angle glaucoma; C/D cup-to-disc ratio; HVF humphrey visual field; GVF goldmann visual field; OCT optical coherence tomography; IOP intraocular pressure; BRVO Branch retinal vein occlusion; CRVO central retinal vein occlusion; CRAO central retinal artery occlusion; BRAO branch retinal artery occlusion; RT retinal tear; SB scleral buckle; PPV pars plana vitrectomy; VH Vitreous hemorrhage; PRP panretinal laser photocoagulation; IVK intravitreal kenalog; VMT vitreomacular traction; MH Macular hole;  NVD neovascularization of the disc; NVE neovascularization elsewhere; AREDS age related eye disease study; ARMD age related macular degeneration; POAG primary open angle glaucoma; EBMD epithelial/anterior basement membrane dystrophy; ACIOL anterior chamber intraocular lens; IOL intraocular lens; PCIOL posterior chamber intraocular lens; Phaco/IOL phacoemulsification with intraocular lens placement; PRK photorefractive keratectomy; LASIK laser  assisted in situ keratomileusis; HTN hypertension; DM diabetes mellitus; COPD chronic obstructive pulmonary disease

## 2023-02-10 ENCOUNTER — Ambulatory Visit (INDEPENDENT_AMBULATORY_CARE_PROVIDER_SITE_OTHER): Payer: Medicare Other | Admitting: Ophthalmology

## 2023-02-10 ENCOUNTER — Encounter (INDEPENDENT_AMBULATORY_CARE_PROVIDER_SITE_OTHER): Payer: Self-pay | Admitting: Ophthalmology

## 2023-02-10 DIAGNOSIS — I10 Essential (primary) hypertension: Secondary | ICD-10-CM

## 2023-02-10 DIAGNOSIS — H353132 Nonexudative age-related macular degeneration, bilateral, intermediate dry stage: Secondary | ICD-10-CM

## 2023-02-10 DIAGNOSIS — H259 Unspecified age-related cataract: Secondary | ICD-10-CM

## 2023-02-10 DIAGNOSIS — Z961 Presence of intraocular lens: Secondary | ICD-10-CM | POA: Diagnosis not present

## 2023-02-10 DIAGNOSIS — H35033 Hypertensive retinopathy, bilateral: Secondary | ICD-10-CM

## 2023-02-20 DIAGNOSIS — M5440 Lumbago with sciatica, unspecified side: Secondary | ICD-10-CM | POA: Diagnosis not present

## 2023-03-03 ENCOUNTER — Encounter (HOSPITAL_COMMUNITY): Payer: Self-pay

## 2023-03-03 ENCOUNTER — Ambulatory Visit (HOSPITAL_COMMUNITY): Payer: Medicare Other

## 2023-03-06 DIAGNOSIS — M5126 Other intervertebral disc displacement, lumbar region: Secondary | ICD-10-CM | POA: Diagnosis not present

## 2023-03-20 ENCOUNTER — Other Ambulatory Visit: Payer: Self-pay | Admitting: Neurosurgery

## 2023-03-27 NOTE — Pre-Procedure Instructions (Signed)
Surgical Instructions    Your procedure is scheduled on Monday 04/07/23.   Report to [facility] Main Entrance "A" at 1:45 P.M., then check in with the Admitting office.  Call this number if you have problems the morning of surgery:  [phone]   If you have any questions prior to your surgery date call [phone]: Open Monday-Friday 8am-4pm If you experience any cold or flu symptoms such as cough, fever, chills, shortness of breath, etc. between now and your scheduled surgery, please notify us at the above number     Remember:  Do not eat after midnight the night before your surgery  You may drink clear liquids until 12:45 P.M. the morning of your surgery.   Clear liquids allowed are: Water, Non-Citrus Juices (without pulp), Carbonated Beverages, Clear Tea, Black Coffee ONLY (NO MILK, CREAM OR POWDERED CREAMER of any kind), and Gatorade    Take these medicines the morning of surgery with A SIP OF WATER:   amLODipine (NORVASC)   buPROPion (WELLBUTRIN XL)   omeprazole (PRILOSEC)   pravastatin (PRAVACHOL)   tamsulosin (FLOMAX)   As of today, STOP taking any Aspirin (unless otherwise instructed by your surgeon) Aleve, Naproxen, Ibuprofen, Motrin, Advil, Goody's, BC's, all herbal medications, fish oil, and all vitamins.  WHAT DO I DO ABOUT MY DIABETES MEDICATION?   Do not take oral diabetes medicines (pills) the morning of surgery.  DO NOT TAKE metformin (FORTAMET) the morning of surgery.    The day of surgery, do not take other diabetes injectables, including Byetta (exenatide), Bydureon (exenatide ER), Victoza (liraglutide), or Trulicity (dulaglutide).  HOW TO MANAGE YOUR DIABETES BEFORE AND AFTER SURGERY  Why is it important to control my blood sugar before and after surgery? Improving blood sugar levels before and after surgery helps healing and can limit problems. A way of improving blood sugar control is eating a healthy diet by:  Eating less sugar and carbohydrates   Increasing activity/exercise  Talking with your doctor about reaching your blood sugar goals High blood sugars (greater than 180 mg/dL) can raise your risk of infections and slow your recovery, so you will need to focus on controlling your diabetes during the weeks before surgery. Make sure that the doctor who takes care of your diabetes knows about your planned surgery including the date and location.  How do I manage my blood sugar before surgery? Check your blood sugar at least 4 times a day, starting 2 days before surgery, to make sure that the level is not too high or low.  Check your blood sugar the morning of your surgery when you wake up and every 2 hours until you get to the Short Stay unit.  If your blood sugar is less than 70 mg/dL, you will need to treat for low blood sugar: Do not take insulin. Treat a low blood sugar (less than 70 mg/dL) with  cup of clear juice (cranberry or apple), 4 glucose tablets, OR glucose gel. Recheck blood sugar in 15 minutes after treatment (to make sure it is greater than 70 mg/dL). If your blood sugar is not greater than 70 mg/dL on recheck, call [phone] for further instructions. Report your blood sugar to the short stay nurse when you get to Short Stay.  If you are admitted to the hospital after surgery: Your blood sugar will be checked by the staff and you will probably be given insulin after surgery (instead of oral diabetes medicines) to make sure you have good blood sugar levels. The  goal for blood sugar control after surgery is 80-180 mg/dL.            Do not wear jewelry or makeup. Do not wear lotions, powders, perfumes/cologne or deodorant. Do not shave 48 hours prior to surgery.  Men may shave face and neck. Do not bring valuables to the hospital. Do not wear nail polish, gel polish, artificial nails, or any other type of covering on natural nails (fingers and toes) If you have artificial nails or gel coating that need to be removed  by a nail salon, please have this removed prior to surgery. Artificial nails or gel coating may interfere with anesthesia's ability to adequately monitor your vital signs.  [facility] is not responsible for any belongings or valuables.    Do NOT Smoke (Tobacco/Vaping)  24 hours prior to your procedure  If you use a CPAP at night, you may bring your mask for your overnight stay.   Contacts, glasses, hearing aids, dentures or partials may not be worn into surgery, please bring cases for these belongings   For patients admitted to the hospital, discharge time will be determined by your treatment team.   Patients discharged the day of surgery will not be allowed to drive home, and someone needs to stay with them for 24 hours.   SURGICAL WAITING ROOM VISITATION Patients having surgery or a procedure may have no more than 2 support people in the waiting area - these visitors may rotate.   Children under the age of 38 must have an adult with them who is not the patient. If the patient needs to stay at the hospital during part of their recovery, the visitor guidelines for inpatient rooms apply. Pre-op nurse will coordinate an appropriate time for 1 support person to accompany patient in pre-op.  This support person may not rotate.   Please refer to [email or web address] for the visitor guidelines for Inpatients (after your surgery is over and you are in a regular room).      Pre-operative 5 CHG Bath Instructions   You can play a key role in reducing the risk of infection after surgery. Your skin needs to be as free of germs as possible. You can reduce the number of germs on your skin by washing with CHG (chlorhexidine gluconate) soap before surgery. CHG is an antiseptic soap that kills germs and continues to kill germs even after washing.   DO NOT use if you have an allergy to chlorhexidine/CHG or antibacterial soaps. If your skin becomes  reddened or irritated, stop using the CHG and notify one of our RNs at [phone].   Please shower with the CHG soap starting 4 days before surgery using the following schedule:     Please keep in mind the following:  DO NOT shave, including legs and underarms, starting the day of your first shower.   You may shave your face at any point before/day of surgery.  Place clean sheets on your bed the day you start using CHG soap. Use a clean washcloth (not used since being washed) for each shower. DO NOT sleep with pets once you start using the CHG.   CHG Shower Instructions:  If you choose to wash your hair and private area, wash first with your normal shampoo/soap.  After you use shampoo/soap, rinse your hair and body thoroughly to remove shampoo/soap residue.  Turn the water OFF and apply about 3 tablespoons (45 ml) of CHG soap to a CLEAN washcloth.  Apply CHG soap ONLY  FROM YOUR NECK DOWN TO YOUR TOES (washing for 3-5 minutes)  DO NOT use CHG soap on face, private areas, open wounds, or sores.  Pay special attention to the area where your surgery is being performed.  If you are having back surgery, having someone wash your back for you may be helpful. Wait 2 minutes after CHG soap is applied, then you may rinse off the CHG soap.  Pat dry with a clean towel  Put on clean clothes/pajamas   If you choose to wear lotion, please use ONLY the CHG-compatible lotions on the back of this paper.     Additional instructions for the day of surgery: DO NOT APPLY any lotions, deodorants, cologne, or perfumes.   Put on clean/comfortable clothes.  Brush your teeth.  Ask your nurse before applying any prescription medications to the skin.      CHG Compatible Lotions   Aveeno Moisturizing lotion  Cetaphil Moisturizing Cream  Cetaphil Moisturizing Lotion  Clairol Herbal Essence Moisturizing Lotion, Dry Skin  Clairol Herbal Essence Moisturizing Lotion, Extra Dry Skin  Clairol Herbal Essence  Moisturizing Lotion, Normal Skin  Curel Age Defying Therapeutic Moisturizing Lotion with Alpha Hydroxy  Curel Extreme Care Body Lotion  Curel Soothing Hands Moisturizing Hand Lotion  Curel Therapeutic Moisturizing Cream, Fragrance-Free  Curel Therapeutic Moisturizing Lotion, Fragrance-Free  Curel Therapeutic Moisturizing Lotion, Original Formula  Eucerin Daily Replenishing Lotion  Eucerin Dry Skin Therapy Plus Alpha Hydroxy Crme  Eucerin Dry Skin Therapy Plus Alpha Hydroxy Lotion  Eucerin Original Crme  Eucerin Original Lotion  Eucerin Plus Crme Eucerin Plus Lotion  Eucerin TriLipid Replenishing Lotion  Keri Anti-Bacterial Hand Lotion  Keri Deep Conditioning Original Lotion Dry Skin Formula Softly Scented  Keri Deep Conditioning Original Lotion, Fragrance Free Sensitive Skin Formula  Keri Lotion Fast Absorbing Fragrance Free Sensitive Skin Formula  Keri Lotion Fast Absorbing Softly Scented Dry Skin Formula  Keri Original Lotion  Keri Skin Renewal Lotion Keri Silky Smooth Lotion  Keri Silky Smooth Sensitive Skin Lotion  Nivea Body Creamy Conditioning Oil  Nivea Body Extra Enriched Lotion  Nivea Body Original Lotion  Nivea Body Sheer Moisturizing Lotion Nivea Crme  Nivea Skin Firming Lotion  NutraDerm 30 Skin Lotion  NutraDerm Skin Lotion  NutraDerm Therapeutic Skin Cream  NutraDerm Therapeutic Skin Lotion  ProShield Protective Hand Cream  Provon moisturizing lotion   Oral Hygiene is also important to reduce your risk of infection.  Remember - BRUSH YOUR TEETH THE MORNING OF SURGERY WITH YOUR REGULAR TOOTHPASTE    If you received a COVID test during your pre-op visit, it is requested that you wear a mask when out in public, stay away from anyone that may not be feeling well, and notify your surgeon if you develop symptoms. If you have been in contact with anyone that has tested positive in the last 10 days, please notify your surgeon.    Please read over the following fact  sheets that you were given.

## 2023-03-28 ENCOUNTER — Encounter (HOSPITAL_COMMUNITY): Payer: Self-pay

## 2023-03-28 ENCOUNTER — Encounter (HOSPITAL_COMMUNITY)
Admission: RE | Admit: 2023-03-28 | Discharge: 2023-03-28 | Disposition: A | Discharge status: Home / Self Care | Payer: Medicare Other | Source: Ambulatory Visit | Attending: Neurosurgery | Admitting: Neurosurgery

## 2023-03-28 ENCOUNTER — Other Ambulatory Visit: Payer: Self-pay

## 2023-03-28 VITALS — BP 145/79 | HR 68 | Temp 97.9°F | Resp 18 | Ht 66.0 in | Wt 166.7 lb

## 2023-03-28 DIAGNOSIS — Z01818 Encounter for other preprocedural examination: Secondary | ICD-10-CM | POA: Diagnosis not present

## 2023-03-28 HISTORY — DX: Depression, unspecified: F32.A

## 2023-03-28 HISTORY — DX: Hyperlipidemia, unspecified: E78.5

## 2023-03-28 LAB — HEMOGLOBIN A1C
Hgb A1c MFr Bld: 6.8 % — ABNORMAL HIGH (ref 4.8–5.6)
Mean Plasma Glucose: 148.46 mg/dL

## 2023-03-28 LAB — SURGICAL PCR SCREEN
MRSA, PCR: NEGATIVE
Staphylococcus aureus: NEGATIVE

## 2023-03-28 LAB — CBC
HCT: 38.6 % — ABNORMAL LOW (ref 39.0–52.0)
Hemoglobin: 11.8 g/dL — ABNORMAL LOW (ref 13.0–17.0)
MCH: 26.3 pg (ref 26.0–34.0)
MCHC: 30.6 g/dL (ref 30.0–36.0)
MCV: 86 fL (ref 80.0–100.0)
Platelets: 235 10*3/uL (ref 150–400)
RBC: 4.49 MIL/uL (ref 4.22–5.81)
RDW: 16.6 % — ABNORMAL HIGH (ref 11.5–15.5)
WBC: 7.6 10*3/uL (ref 4.0–10.5)
nRBC: 0 % (ref 0.0–0.2)

## 2023-03-28 LAB — BASIC METABOLIC PANEL
Anion gap: 8 (ref 5–15)
BUN: 20 mg/dL (ref 8–23)
CO2: 23 mmol/L (ref 22–32)
Calcium: 8.8 mg/dL — ABNORMAL LOW (ref 8.9–10.3)
Chloride: 106 mmol/L (ref 98–111)
Creatinine, Ser: 1.28 mg/dL — ABNORMAL HIGH (ref 0.61–1.24)
GFR, Estimated: 56 mL/min — ABNORMAL LOW (ref >60)
Glucose, Bld: 119 mg/dL — ABNORMAL HIGH (ref 70–99)
Potassium: 3.9 mmol/L (ref 3.5–5.1)
Sodium: 137 mmol/L (ref 135–145)

## 2023-03-28 NOTE — Progress Notes (Signed)
PCP - Dr Dwana Melena Cardiologist - n/a  Chest x-ray - n/a EKG - 03/28/23 Stress Test - n/a ECHO - n/a Cardiac Cath - n/a  ICD Pacemaker/Loop - n/a  Sleep Study -  n/a CPAP - none  Diabetes Type 2 Do not take Metformin on the morning of surgery.  Patient does not check his blood sugar.  ERAS: Clear liquids til 12:45 PM DOS  STOP now taking any Aspirin (unless otherwise instructed by your surgeon), Aleve, Naproxen, Ibuprofen, Motrin, Advil, Goody's, BC's, all herbal medications, fish oil, and all vitamins.   Coronavirus Screening Do you have any of the following symptoms:  Cough yes/no: No Fever (>100.10F)  yes/no: No Runny nose yes/no: No Sore throat yes/no: No Difficulty breathing/shortness of breath  yes/no: No  Have you traveled in the last 14 days and where? yes/no: No  Patient verbalized understanding of instructions that were given to them at the PAT appointment. Patient was also instructed that they will need to review over the PAT instructions again at home before surgery.

## 2023-04-07 ENCOUNTER — Ambulatory Visit (HOSPITAL_COMMUNITY): Payer: Medicare Other

## 2023-04-07 ENCOUNTER — Encounter (HOSPITAL_COMMUNITY): Payer: Self-pay | Admitting: Neurosurgery

## 2023-04-07 ENCOUNTER — Other Ambulatory Visit: Payer: Self-pay

## 2023-04-07 ENCOUNTER — Encounter (HOSPITAL_COMMUNITY)
Admission: RE | Disposition: A | Discharge status: Home / Self Care | Payer: Self-pay | Source: Home / Self Care | Attending: Neurosurgery

## 2023-04-07 ENCOUNTER — Ambulatory Visit (HOSPITAL_COMMUNITY)
Admission: RE | Admit: 2023-04-07 | Discharge: 2023-04-08 | Disposition: A | Discharge status: Home / Self Care | Payer: Medicare Other | Attending: Neurosurgery | Admitting: Neurosurgery

## 2023-04-07 DIAGNOSIS — I10 Essential (primary) hypertension: Secondary | ICD-10-CM | POA: Diagnosis not present

## 2023-04-07 DIAGNOSIS — M5116 Intervertebral disc disorders with radiculopathy, lumbar region: Secondary | ICD-10-CM | POA: Diagnosis not present

## 2023-04-07 DIAGNOSIS — M5126 Other intervertebral disc displacement, lumbar region: Secondary | ICD-10-CM

## 2023-04-07 DIAGNOSIS — K219 Gastro-esophageal reflux disease without esophagitis: Secondary | ICD-10-CM | POA: Insufficient documentation

## 2023-04-07 DIAGNOSIS — E119 Type 2 diabetes mellitus without complications: Secondary | ICD-10-CM | POA: Insufficient documentation

## 2023-04-07 DIAGNOSIS — Z0189 Encounter for other specified special examinations: Secondary | ICD-10-CM | POA: Diagnosis not present

## 2023-04-07 DIAGNOSIS — E785 Hyperlipidemia, unspecified: Secondary | ICD-10-CM | POA: Diagnosis not present

## 2023-04-07 HISTORY — PX: LUMBAR LAMINECTOMY/DECOMPRESSION MICRODISCECTOMY: SHX5026

## 2023-04-07 LAB — GLUCOSE, CAPILLARY
Glucose-Capillary: 103 mg/dL — ABNORMAL HIGH (ref 70–99)
Glucose-Capillary: 112 mg/dL — ABNORMAL HIGH (ref 70–99)
Glucose-Capillary: 96 mg/dL (ref 70–99)
Glucose-Capillary: 198 mg/dL — ABNORMAL HIGH (ref 70–99)

## 2023-04-07 SURGERY — LUMBAR LAMINECTOMY/DECOMPRESSION MICRODISCECTOMY 1 LEVEL
Anesthesia: General | Site: Back | Laterality: Right

## 2023-04-07 MED ORDER — CHLORHEXIDINE GLUCONATE CLOTH 2 % EX PADS: 6.0000 | MEDICATED_PAD | Freq: Once | CUTANEOUS | Status: DC

## 2023-04-07 MED ORDER — PANTOPRAZOLE SODIUM 40 MG IV SOLR: 40.0000 mg | Freq: Every day | INTRAVENOUS | Status: DC

## 2023-04-07 MED ORDER — HYDROCODONE-ACETAMINOPHEN 5-325 MG PO TABS: 2.0000 | ORAL_TABLET | ORAL | Status: DC | PRN

## 2023-04-07 MED ORDER — NEOSTIGMINE METHYLSULFATE 3 MG/3ML IV SOSY
PREFILLED_SYRINGE | INTRAVENOUS | Status: AC
  Filled 2023-04-07: qty 3

## 2023-04-07 MED ORDER — THROMBIN 5000 UNITS EX SOLR
CUTANEOUS | Status: AC
  Filled 2023-04-07: qty 10000

## 2023-04-07 MED ORDER — ACETAMINOPHEN 650 MG RE SUPP: 650.0000 mg | RECTAL | Status: DC | PRN

## 2023-04-07 MED ORDER — SUCCINYLCHOLINE CHLORIDE 200 MG/10ML IV SOSY
PREFILLED_SYRINGE | INTRAVENOUS | Status: AC
  Filled 2023-04-07: qty 10

## 2023-04-07 MED ORDER — LIDOCAINE 2% (20 MG/ML) 5 ML SYRINGE
INTRAMUSCULAR | Status: AC
  Filled 2023-04-07: qty 5

## 2023-04-07 MED ORDER — LISINOPRIL 20 MG PO TABS: 20.0000 mg | ORAL_TABLET | Freq: Every day | ORAL | Status: DC

## 2023-04-07 MED ORDER — CEFAZOLIN SODIUM-DEXTROSE 2-4 GM/100ML-% IV SOLN
2.0000 g | Freq: Three times a day (TID) | INTRAVENOUS | Status: AC
  Administered 2023-04-07 – 2023-04-08 (×2): 2 g via INTRAVENOUS
  Filled 2023-04-07 (×2): qty 100

## 2023-04-07 MED ORDER — LIDOCAINE 2% (20 MG/ML) 5 ML SYRINGE
INTRAMUSCULAR | Status: DC | PRN
  Administered 2023-04-07: 60 mg via INTRAVENOUS

## 2023-04-07 MED ORDER — INSULIN ASPART 100 UNIT/ML IJ SOLN: 0.0000 [IU] | INTRAMUSCULAR | Status: DC | PRN

## 2023-04-07 MED ORDER — PHENYLEPHRINE HCL-NACL 20-0.9 MG/250ML-% IV SOLN
INTRAVENOUS | Status: DC | PRN
  Administered 2023-04-07: 20 ug/min via INTRAVENOUS

## 2023-04-07 MED ORDER — SODIUM CHLORIDE 0.9% FLUSH: 3.0000 mL | INTRAVENOUS | Status: DC | PRN

## 2023-04-07 MED ORDER — MENTHOL 3 MG MT LOZG: 1.0000 | LOZENGE | OROMUCOSAL | Status: DC | PRN

## 2023-04-07 MED ORDER — FENTANYL CITRATE (PF) 250 MCG/5ML IJ SOLN
INTRAMUSCULAR | Status: AC
  Filled 2023-04-07: qty 5

## 2023-04-07 MED ORDER — PHENYLEPHRINE 80 MCG/ML (10ML) SYRINGE FOR IV PUSH (FOR BLOOD PRESSURE SUPPORT)
PREFILLED_SYRINGE | INTRAVENOUS | Status: AC
  Filled 2023-04-07: qty 10

## 2023-04-07 MED ORDER — FENTANYL CITRATE (PF) 100 MCG/2ML IJ SOLN: 25.0000 ug | INTRAMUSCULAR | Status: DC | PRN

## 2023-04-07 MED ORDER — PROPOFOL 10 MG/ML IV BOLUS
INTRAVENOUS | Status: DC | PRN
Start: 2023-04-07 — End: 2023-04-07
  Administered 2023-04-07: 120 mg via INTRAVENOUS

## 2023-04-07 MED ORDER — ONDANSETRON HCL 4 MG PO TABS: 4.0000 mg | ORAL_TABLET | Freq: Four times a day (QID) | ORAL | Status: DC | PRN

## 2023-04-07 MED ORDER — PHENOL 1.4 % MT LIQD: 1.0000 | OROMUCOSAL | Status: DC | PRN

## 2023-04-07 MED ORDER — PROMETHAZINE HCL 25 MG/ML IJ SOLN: 6.2500 mg | INTRAMUSCULAR | Status: DC | PRN

## 2023-04-07 MED ORDER — FENTANYL CITRATE (PF) 250 MCG/5ML IJ SOLN
INTRAMUSCULAR | Status: DC | PRN
  Administered 2023-04-07: 100 ug via INTRAVENOUS
  Administered 2023-04-07: 50 ug via INTRAVENOUS

## 2023-04-07 MED ORDER — ROCURONIUM BROMIDE 10 MG/ML (PF) SYRINGE
PREFILLED_SYRINGE | INTRAVENOUS | Status: DC | PRN
  Administered 2023-04-07: 60 mg via INTRAVENOUS

## 2023-04-07 MED ORDER — ORAL CARE MOUTH RINSE: 15.0000 mL | Freq: Once | OROMUCOSAL | Status: AC

## 2023-04-07 MED ORDER — ALUM & MAG HYDROXIDE-SIMETH 200-200-20 MG/5ML PO SUSP: 30.0000 mL | Freq: Four times a day (QID) | ORAL | Status: DC | PRN

## 2023-04-07 MED ORDER — OXYCODONE HCL 5 MG PO TABS: 5.0000 mg | ORAL_TABLET | Freq: Once | ORAL | Status: DC | PRN

## 2023-04-07 MED ORDER — SODIUM CHLORIDE 0.9 % IV SOLN: 250.0000 mL | INTRAVENOUS | Status: DC

## 2023-04-07 MED ORDER — LIDOCAINE-EPINEPHRINE 1 %-1:100000 IJ SOLN
INTRAMUSCULAR | Status: AC
  Filled 2023-04-07: qty 1

## 2023-04-07 MED ORDER — ONDANSETRON HCL 4 MG/2ML IJ SOLN
INTRAMUSCULAR | Status: AC
  Filled 2023-04-07: qty 2

## 2023-04-07 MED ORDER — CYCLOBENZAPRINE HCL 10 MG PO TABS: 10.0000 mg | ORAL_TABLET | Freq: Three times a day (TID) | ORAL | Status: DC | PRN

## 2023-04-07 MED ORDER — OXYCODONE HCL 5 MG/5ML PO SOLN: 5.0000 mg | Freq: Once | ORAL | Status: DC | PRN

## 2023-04-07 MED ORDER — PHENYLEPHRINE 80 MCG/ML (10ML) SYRINGE FOR IV PUSH (FOR BLOOD PRESSURE SUPPORT)
PREFILLED_SYRINGE | INTRAVENOUS | Status: DC | PRN
  Administered 2023-04-07 (×2): 80 ug via INTRAVENOUS
  Administered 2023-04-07: 160 ug via INTRAVENOUS
  Administered 2023-04-07: 80 ug via INTRAVENOUS

## 2023-04-07 MED ORDER — MIDAZOLAM HCL 2 MG/2ML IJ SOLN: 0.5000 mg | Freq: Once | INTRAMUSCULAR | Status: DC | PRN

## 2023-04-07 MED ORDER — HYDROMORPHONE HCL 1 MG/ML IJ SOLN: 0.5000 mg | INTRAMUSCULAR | Status: DC | PRN

## 2023-04-07 MED ORDER — GLYCOPYRROLATE PF 0.2 MG/ML IJ SOSY
PREFILLED_SYRINGE | INTRAMUSCULAR | Status: AC
  Filled 2023-04-07: qty 1

## 2023-04-07 MED ORDER — DEXAMETHASONE SODIUM PHOSPHATE 10 MG/ML IJ SOLN
INTRAMUSCULAR | Status: DC | PRN
  Administered 2023-04-07: 8 mg via INTRAVENOUS

## 2023-04-07 MED ORDER — ONDANSETRON HCL 4 MG/2ML IJ SOLN
INTRAMUSCULAR | Status: DC | PRN
  Administered 2023-04-07: 4 mg via INTRAVENOUS

## 2023-04-07 MED ORDER — SODIUM CHLORIDE (PF) 0.9 % IJ SOLN
INTRAMUSCULAR | Status: AC
  Filled 2023-04-07: qty 10

## 2023-04-07 MED ORDER — BUPIVACAINE HCL (PF) 0.25 % IJ SOLN
INTRAMUSCULAR | Status: AC
  Filled 2023-04-07: qty 30

## 2023-04-07 MED ORDER — THROMBIN (RECOMBINANT) 5000 UNITS EX SOLR
CUTANEOUS | Status: DC | PRN
  Administered 2023-04-07: 1 via TOPICAL

## 2023-04-07 MED ORDER — ONDANSETRON HCL 4 MG/2ML IJ SOLN: 4.0000 mg | Freq: Four times a day (QID) | INTRAMUSCULAR | Status: DC | PRN

## 2023-04-07 MED ORDER — BUPROPION HCL ER (XL) 150 MG PO TB24: 150.0000 mg | ORAL_TABLET | Freq: Every day | ORAL | Status: DC

## 2023-04-07 MED ORDER — AMLODIPINE BESYLATE 5 MG PO TABS: 5.0000 mg | ORAL_TABLET | Freq: Every day | ORAL | Status: DC

## 2023-04-07 MED ORDER — CHLORHEXIDINE GLUCONATE 0.12 % MT SOLN: 15.0000 mL | Freq: Once | OROMUCOSAL | Status: AC

## 2023-04-07 MED ORDER — LACTATED RINGERS IV SOLN: INTRAVENOUS | Status: DC

## 2023-04-07 MED ORDER — CEFAZOLIN SODIUM-DEXTROSE 2-4 GM/100ML-% IV SOLN
2.0000 g | INTRAVENOUS | Status: AC
  Administered 2023-04-07: 2 g via INTRAVENOUS

## 2023-04-07 MED ORDER — IBUPROFEN 600 MG PO TABS
600.0000 mg | ORAL_TABLET | Freq: Three times a day (TID) | ORAL | Status: DC | PRN
  Filled 2023-04-07: qty 1

## 2023-04-07 MED ORDER — PROPOFOL 10 MG/ML IV BOLUS
INTRAVENOUS | Status: AC
  Filled 2023-04-07: qty 20

## 2023-04-07 MED ORDER — INSULIN ASPART 100 UNIT/ML IJ SOLN: 0.0000 [IU] | Freq: Three times a day (TID) | INTRAMUSCULAR | Status: DC

## 2023-04-07 MED ORDER — 0.9 % SODIUM CHLORIDE (POUR BTL) OPTIME
TOPICAL | Status: DC | PRN
  Administered 2023-04-07: 1000 mL

## 2023-04-07 MED ORDER — ACETAMINOPHEN 500 MG PO TABS: 1000.0000 mg | ORAL_TABLET | Freq: Once | ORAL | Status: AC

## 2023-04-07 MED ORDER — CHLORHEXIDINE GLUCONATE 0.12 % MT SOLN
OROMUCOSAL | Status: AC
  Administered 2023-04-07: 15 mL via OROMUCOSAL
  Filled 2023-04-07: qty 15

## 2023-04-07 MED ORDER — PRAVASTATIN SODIUM 10 MG PO TABS: 20.0000 mg | ORAL_TABLET | Freq: Every day | ORAL | Status: DC

## 2023-04-07 MED ORDER — SODIUM CHLORIDE 0.9% FLUSH: 3.0000 mL | Freq: Two times a day (BID) | INTRAVENOUS | Status: DC

## 2023-04-07 MED ORDER — CEFAZOLIN SODIUM-DEXTROSE 2-4 GM/100ML-% IV SOLN
INTRAVENOUS | Status: AC
  Filled 2023-04-07: qty 100

## 2023-04-07 MED ORDER — ACETAMINOPHEN 325 MG PO TABS: 650.0000 mg | ORAL_TABLET | ORAL | Status: DC | PRN

## 2023-04-07 MED ORDER — EPHEDRINE 5 MG/ML INJ
INTRAVENOUS | Status: AC
  Filled 2023-04-07: qty 5

## 2023-04-07 MED ORDER — BUPIVACAINE HCL (PF) 0.25 % IJ SOLN
INTRAMUSCULAR | Status: DC | PRN
  Administered 2023-04-07: 10 mL

## 2023-04-07 MED ORDER — ACETAMINOPHEN 500 MG PO TABS
ORAL_TABLET | ORAL | Status: AC
  Administered 2023-04-07: 1000 mg via ORAL
  Filled 2023-04-07: qty 2

## 2023-04-07 MED ORDER — LIDOCAINE-EPINEPHRINE 1 %-1:100000 IJ SOLN
INTRAMUSCULAR | Status: DC | PRN
  Administered 2023-04-07: 10 mL

## 2023-04-07 MED ORDER — SUGAMMADEX SODIUM 200 MG/2ML IV SOLN
INTRAVENOUS | Status: DC | PRN
  Administered 2023-04-07: 200 mg via INTRAVENOUS

## 2023-04-07 MED ORDER — PANTOPRAZOLE SODIUM 40 MG PO TBEC: 40.0000 mg | DELAYED_RELEASE_TABLET | Freq: Every day | ORAL | Status: DC

## 2023-04-07 MED ORDER — METFORMIN HCL ER 500 MG PO TB24: 500.0000 mg | ORAL_TABLET | Freq: Every day | ORAL | Status: DC

## 2023-04-07 MED ORDER — ARTIFICIAL TEARS OPHTHALMIC OINT
TOPICAL_OINTMENT | OPHTHALMIC | Status: AC
  Filled 2023-04-07: qty 3.5

## 2023-04-07 SURGICAL SUPPLY — 49 items
ADH SKN CLS APL DERMABOND .7 (GAUZE/BANDAGES/DRESSINGS) ×1
APL SKNCLS STERI-STRIP NONHPOA (GAUZE/BANDAGES/DRESSINGS) ×1
BAG COUNTER SPONGE SURGICOUNT (BAG) ×1 IMPLANT
BAG SPNG CNTER NS LX DISP (BAG) ×1
BENZOIN TINCTURE PRP APPL 2/3 (GAUZE/BANDAGES/DRESSINGS) ×1 IMPLANT
BLADE CLIPPER SURG (BLADE) IMPLANT
BLADE SURG 11 STRL SS (BLADE) ×1 IMPLANT
BUR CUTTER 7.0 ROUND (BURR) ×1 IMPLANT
BUR MATCHSTICK NEURO 3.0 LAGG (BURR) ×1 IMPLANT
CANISTER SUCT 3000ML PPV (MISCELLANEOUS) ×1 IMPLANT
DERMABOND ADVANCED .7 DNX12 (GAUZE/BANDAGES/DRESSINGS) ×1 IMPLANT
DRAPE HALF SHEET 40X57 (DRAPES) IMPLANT
DRAPE LAPAROTOMY 100X72X124 (DRAPES) ×1 IMPLANT
DRAPE MICROSCOPE SLANT 54X150 (MISCELLANEOUS) ×1 IMPLANT
DRAPE SURG 17X23 STRL (DRAPES) ×1 IMPLANT
DRSG OPSITE POSTOP 4X6 (GAUZE/BANDAGES/DRESSINGS) IMPLANT
DURAPREP 26ML APPLICATOR (WOUND CARE) ×1 IMPLANT
ELECT REM PT RETURN 9FT ADLT (ELECTROSURGICAL) ×1
ELECTRODE REM PT RTRN 9FT ADLT (ELECTROSURGICAL) ×1 IMPLANT
GAUZE 4X4 16PLY ~~LOC~~+RFID DBL (SPONGE) IMPLANT
GAUZE SPONGE 4X4 12PLY STRL (GAUZE/BANDAGES/DRESSINGS) ×1 IMPLANT
GLOVE BIO SURGEON STRL SZ7 (GLOVE) IMPLANT
GLOVE BIO SURGEON STRL SZ8 (GLOVE) ×1 IMPLANT
GLOVE BIOGEL PI IND STRL 7.0 (GLOVE) IMPLANT
GLOVE EXAM NITRILE XL STR (GLOVE) IMPLANT
GLOVE INDICATOR 8.5 STRL (GLOVE) ×2 IMPLANT
GOWN STRL REUS W/ TWL LRG LVL3 (GOWN DISPOSABLE) ×1 IMPLANT
GOWN STRL REUS W/ TWL XL LVL3 (GOWN DISPOSABLE) ×2 IMPLANT
GOWN STRL REUS W/TWL 2XL LVL3 (GOWN DISPOSABLE) IMPLANT
GOWN STRL REUS W/TWL LRG LVL3 (GOWN DISPOSABLE) ×1
GOWN STRL REUS W/TWL XL LVL3 (GOWN DISPOSABLE) ×2
KIT BASIN OR (CUSTOM PROCEDURE TRAY) ×1 IMPLANT
KIT TURNOVER KIT B (KITS) ×1 IMPLANT
NDL HYPO 22X1.5 SAFETY MO (MISCELLANEOUS) ×1 IMPLANT
NDL SPNL 22GX3.5 QUINCKE BK (NEEDLE) ×1 IMPLANT
NEEDLE HYPO 22X1.5 SAFETY MO (MISCELLANEOUS) ×1 IMPLANT
NEEDLE SPNL 22GX3.5 QUINCKE BK (NEEDLE) ×1 IMPLANT
NS IRRIG 1000ML POUR BTL (IV SOLUTION) ×1 IMPLANT
PACK LAMINECTOMY NEURO (CUSTOM PROCEDURE TRAY) ×1 IMPLANT
SPIKE FLUID TRANSFER (MISCELLANEOUS) ×1 IMPLANT
SPONGE SURGIFOAM ABS GEL SZ50 (HEMOSTASIS) ×1 IMPLANT
STRIP CLOSURE SKIN 1/2X4 (GAUZE/BANDAGES/DRESSINGS) ×1 IMPLANT
SUT VIC AB 0 CT1 18XCR BRD8 (SUTURE) ×1 IMPLANT
SUT VIC AB 0 CT1 8-18 (SUTURE) ×1
SUT VIC AB 2-0 CT1 18 (SUTURE) ×1 IMPLANT
SUT VICRYL 4-0 PS2 18IN ABS (SUTURE) ×1 IMPLANT
TOWEL GREEN STERILE (TOWEL DISPOSABLE) ×1 IMPLANT
TOWEL GREEN STERILE FF (TOWEL DISPOSABLE) ×1 IMPLANT
WATER STERILE IRR 1000ML POUR (IV SOLUTION) ×1 IMPLANT

## 2023-04-07 NOTE — H&P (Signed)
Alejandro Krueger is an 82 y.o. male.   Chief Complaint: Back and right leg pain HPI: 82 year old gentleman with back and right leg pain rating down top of his foot and big toe workup revealed disc ideation with a free fragment displacing the right L5 nerve root.  Due to the patient's progression of clinical syndrome imaging findings of a conservative treatment I recommended laminectomy microdiscectomy at L4-5 on the right.  I extensively reviewed the risks and benefits of the operation with him as well as perioperative course expectations of outcome and alternatives of surgery and he understood and agreed to proceed forward.  Past Medical History:  Diagnosis Date   Depression    Diabetes mellitus without complication (HCC)    type 2   GERD (gastroesophageal reflux disease)    HLD (hyperlipidemia)    Hx of adenomatous colonic polyps    Hypertension    Prostate cancer (HCC)    Umbilical hernia with obstruction     Past Surgical History:  Procedure Laterality Date   COLONOSCOPY  06/23/2007   JXB:[id number] polyps removed (cecum and hepatic flexure). The remainder of the colonic mucosa appeared normal/Left-sided diverticula/Normal rectum (tubular adenomas)   COLONOSCOPY N/A 01/05/2014   Procedure: COLONOSCOPY;  Surgeon: Corbin Ade, MD;  Location: [facility];  Service: Endoscopy;  Laterality: N/A;  9:30   ESOPHAGOGASTRODUODENOSCOPY N/A 01/05/2014   Procedure: ESOPHAGOGASTRODUODENOSCOPY (EGD);  Surgeon: Corbin Ade, MD;  Location: [facility];  Service: Endoscopy;  Laterality: N/A;   HARDWARE REMOVAL Right 11/17/2020   Procedure: RIGHT HIP HARDWARE REMOVAL;  Surgeon: Vickki Hearing, MD;  Location: [facility];  Service: Orthopedics;  Laterality: Right;   HIP PINNING,CANNULATED Right 11/09/2019   Procedure: CANNULATED HIP PINNING;  Surgeon: Vickki Hearing, MD;  Location: [facility];  Service: Orthopedics;  Laterality: Right;   KNEE ARTHROSCOPY  2003   right   prostate biopsy  2016    RADIOACTIVE SEED IMPLANT N/A 06/02/2015   Procedure: RADIOACTIVE SEED IMPLANT/BRACHYTHERAPY IMPLANT;  Surgeon: Jerilee Field, MD;  Location: [facility];  Service: Urology;  Laterality: N/A;  63 seeds implanted no seeds in bladder   UMBILICAL HERNIA REPAIR N/A 02/12/2017   Procedure: HERNIA REPAIR UMBILICAL ADULT WITH MESH;  Surgeon: Franky Macho, MD;  Location: [facility];  Service: General;  Laterality: N/A;    Family History  Problem Relation Age of Onset   Diabetes Mother    Heart attack Father    Colon cancer Neg Hx    Social History:  reports that he has never smoked. He has never used smokeless tobacco. He reports that he does not drink alcohol and does not use drugs.  Allergies: No Known Allergies  Medications Prior to Admission  Medication Sig Dispense Refill   amLODipine (NORVASC) 5 MG tablet Take 5 mg by mouth in the morning.     buPROPion (WELLBUTRIN XL) 150 MG 24 hr tablet Take 150 mg by mouth in the morning.     lisinopril (ZESTRIL) 20 MG tablet Take 20 mg by mouth in the morning.     metFORMIN (GLUCOPHAGE-XR) 500 MG 24 hr tablet Take 500 mg by mouth daily.     omeprazole (PRILOSEC) 20 MG capsule Take 20 mg by mouth daily.     pravastatin (PRAVACHOL) 20 MG tablet Take 20 mg by mouth in the morning.     ibuprofen (ADVIL) 800 MG tablet Take 1 tablet (800 mg total) by mouth every 8 (eight) hours as needed. (Patient not  taking: Reported on 03/27/2023) 90 tablet 1    Results for orders placed or performed during the hospital encounter of 04/07/23 (from the past 48 hour(s))  Glucose, capillary     Status: Abnormal   Collection Time: 04/07/23  1:34 PM  Result Value Ref Range   Glucose-Capillary 103 (H) 70 - 99 mg/dL    Comment: Glucose reference range applies only to samples taken after fasting for at least 8 hours.   No results found.  Review of Systems  Musculoskeletal:  Positive for back pain.  Neurological:  Positive for weakness and numbness.    Blood  pressure (!) 167/80, pulse 75, temperature 97.9 F (36.6 C), temperature source Oral, resp. rate 18, height 5\' 6"  (1.676 m), weight 75.6 kg, SpO2 98%. Physical Exam HENT:     Head: Normocephalic.     Nose: Nose normal.     Mouth/Throat:     Mouth: Mucous membranes are moist.  Eyes:     Pupils: Pupils are equal, round, and reactive to light.  Cardiovascular:     Rate and Rhythm: Normal rate.  Pulmonary:     Effort: Pulmonary effort is normal.  Abdominal:     General: Abdomen is flat.  Musculoskeletal:        General: Normal range of motion.     Cervical back: Normal range of motion.  Neurological:     Mental Status: He is alert.     Comments: Strength is 5 out of 5 iliopsoas, quads, hamstrings, gastroc into tibialis, and EHL on the left right EHL is weak at 4+ out of 5 with some slight dorsiflexion weakness      Assessment/Plan 82 year old presents for right-sided microdiscectomy L4-5  Mariam Dollar, MD 04/07/2023, 2:36 PM

## 2023-04-07 NOTE — Transfer of Care (Signed)
Immediate Anesthesia Transfer of Care Note  Patient: Alejandro Krueger  Procedure(s) Performed: Microdiscectomy - right - Lumbar four-Lumbar five (Right: Back)  Patient Location: PACU  Anesthesia Type:General  Level of Consciousness: awake and alert   Airway & Oxygen Therapy: Patient Spontanous Breathing and Patient connected to face mask oxygen  Post-op Assessment: Report given to RN and Post -op Vital signs reviewed and stable  Post vital signs: Reviewed and stable  Last Vitals:  Vitals Value Taken Time  BP    Temp    Pulse    Resp    SpO2      Last Pain:  Vitals:   04/07/23 1342  TempSrc:   PainSc: 0-No pain      Patients Stated Pain Goal: 3 (04/07/23 1342)  Complications: No notable events documented.

## 2023-04-07 NOTE — Op Note (Signed)
Preoperative diagnosis: Right L5 radiculopathy from herniated nucleus pulposus L4-5 right.  Postoperative diagnosis: Same.  Procedure: Lumbar laminectomy and microdiscectomy L4-5 on the right with microdissection of the right L5 nerve root microscopic discectomy.  Surgeon: Donalee Citrin.  Assistant: Julien Girt.  Anesthesia: General.  EBL: Minimal.  HPI: 82 year old gentleman with progressive worsening back and right leg pain with weakness in dorsiflexion and EHL.  Workup revealed disc herniation with a free fragment displacing the right L5 nerve root.  Due to the patient's progression of clinical syndrome imaging findings of a conservative treatment I recommended laminectomy discectomy at L4-5 on the right.  I extensively reviewed the risks and benefits of the operation with the patient as well as perioperative course expectations of outcome and alternatives of surgery and he understood and agreed to proceed forward.  Operative procedure: Patient was brought into the OR was induced under general anesthesia positioned prone on the Wilson frame his back was prepped and draped in routine sterile fashion.  Preoperative x-ray localized the appropriate level so after infiltration of 10 cc lidocaine with epi midline incision was made and Bovie electrocautery was used to take down subcutaneous tissue and subperiosteal dissection was carried lamina of L4 and L5.  Interoperative x-ray however identified initially the L5-S1 to space potential was taken 1 interspace above this and the inferior aspect of lamina L4 medial facet complex superior aspect of lamina L5 was drilled down and removed in piecemeal fashion.  Ligament flavum is identified and removed in piecemeal fashion as well exposing the thecal sac.  Under microscopic lamination further under biting of the medial facet allowed indication the L5 pedicle and the L5 nerve root was densely adherent to scar tissue and the free fragment underneath.  I then  developed the plane underneath the nerve root reflected off the disc base incised the disc and then worked under the scar to free up several large fragments of disc.  There was still a fair amount of inflammatory response and scar tissue on the undersurface of the fibroid however at the end of discectomy and all the fragments were removed there was no further fragments appreciated the nerve root was easily mobile within the foramen and over the disc base and and I was easily able to explore the foramen with a coronary dilator.  Wounds and copiously irrigated meticulous hemostasis was maintained Gelfoam was ON top of the dura and the muscle and fascia approximate layers with active Vicryl and the skin was closed running 4 subcuticular.  Dermabond benzoin Steri-Strips and a sterile dressing was applied patient recovery in stable condition.  At the end the case all needle count sponge counts were correct.

## 2023-04-07 NOTE — Anesthesia Procedure Notes (Signed)
Procedure Name: Intubation Date/Time: 04/07/2023 3:00 PM  Performed by: Marcene Duos, MDPre-anesthesia Checklist: Patient identified, Emergency Drugs available, Suction available and Patient being monitored Patient Re-evaluated:Patient Re-evaluated prior to induction Oxygen Delivery Method: Circle system utilized Preoxygenation: Pre-oxygenation with 100% oxygen Induction Type: IV induction Ventilation: Mask ventilation without difficulty Laryngoscope Size: Mac and 3 Grade View: Grade I Tube type: Oral Tube size: 7.5 mm Number of attempts: 1 Airway Equipment and Method: Stylet and Oral airway Placement Confirmation: ETT inserted through vocal cords under direct vision, positive ETCO2 and breath sounds checked- equal and bilateral Secured at: 21 cm Tube secured with: Tape Dental Injury: Teeth and Oropharynx as per pre-operative assessment

## 2023-04-07 NOTE — Anesthesia Preprocedure Evaluation (Addendum)
Anesthesia Evaluation  Patient identified by MRN, date of birth, ID band Patient awake    Reviewed: Allergy & Precautions, NPO status , Patient's Chart, lab work & pertinent test results  Airway Mallampati: II  TM Distance: >3 FB Neck ROM: Full    Dental  (+) Dental Advisory Given   Pulmonary neg pulmonary ROS   breath sounds clear to auscultation       Cardiovascular hypertension, Pt. on medications  Rhythm:Regular Rate:Normal     Neuro/Psych  Neuromuscular disease    GI/Hepatic ,GERD  Medicated,,  Endo/Other  diabetes, Type 2, Oral Hypoglycemic Agents    Renal/GU Renal InsufficiencyRenal disease   H/o prostate cancer    Musculoskeletal   Abdominal   Peds  Hematology  (+) Blood dyscrasia (Hb 11.8), anemia   Anesthesia Other Findings   Reproductive/Obstetrics                             Anesthesia Physical Anesthesia Plan  ASA: 3  Anesthesia Plan: General   Post-op Pain Management: Tylenol PO (pre-op)*   Induction: Intravenous  PONV Risk Score and Plan: 2 and Ondansetron and Dexamethasone  Airway Management Planned: Oral ETT  Additional Equipment: None  Intra-op Plan:   Post-operative Plan: Extubation in OR  Informed Consent: I have reviewed the patients History and Physical, chart, labs and discussed the procedure including the risks, benefits and alternatives for the proposed anesthesia with the patient or authorized representative who has indicated his/her understanding and acceptance.     Dental advisory given  Plan Discussed with: CRNA  Anesthesia Plan Comments:         Anesthesia Quick Evaluation

## 2023-04-08 ENCOUNTER — Encounter (HOSPITAL_COMMUNITY): Payer: Self-pay | Admitting: Neurosurgery

## 2023-04-08 DIAGNOSIS — I10 Essential (primary) hypertension: Secondary | ICD-10-CM | POA: Diagnosis not present

## 2023-04-08 DIAGNOSIS — M5116 Intervertebral disc disorders with radiculopathy, lumbar region: Secondary | ICD-10-CM | POA: Diagnosis not present

## 2023-04-08 DIAGNOSIS — E119 Type 2 diabetes mellitus without complications: Secondary | ICD-10-CM | POA: Diagnosis not present

## 2023-04-08 DIAGNOSIS — K219 Gastro-esophageal reflux disease without esophagitis: Secondary | ICD-10-CM | POA: Diagnosis not present

## 2023-04-08 LAB — GLUCOSE, CAPILLARY: Glucose-Capillary: 130 mg/dL — ABNORMAL HIGH (ref 70–99)

## 2023-04-08 MED ORDER — INSULIN ASPART 100 UNIT/ML IJ SOLN: 0.0000 [IU] | Freq: Three times a day (TID) | INTRAMUSCULAR | Status: DC

## 2023-04-08 MED ORDER — HYDROCODONE-ACETAMINOPHEN 5-325 MG PO TABS
1.0000 | ORAL_TABLET | ORAL | Status: DC | PRN
  Administered 2023-04-08 (×2): 1 via ORAL
  Filled 2023-04-08 (×2): qty 1

## 2023-04-08 MED ORDER — HYDROCODONE-ACETAMINOPHEN 5-325 MG PO TABS: 1.0000 | ORAL_TABLET | ORAL | 0 refills | Status: DC | PRN

## 2023-04-08 MED ORDER — METFORMIN HCL ER 500 MG PO TB24
500.0000 mg | ORAL_TABLET | Freq: Every day | ORAL | Status: DC
  Administered 2023-04-08: 500 mg via ORAL
  Filled 2023-04-08: qty 1

## 2023-04-08 NOTE — Anesthesia Postprocedure Evaluation (Signed)
Anesthesia Post Note  Patient: Alejandro Krueger  Procedure(s) Performed: Microdiscectomy - right - Lumbar four-Lumbar five (Right: Back)     Patient location during evaluation: PACU Anesthesia Type: General Level of consciousness: awake and alert Pain management: pain level controlled Vital Signs Assessment: post-procedure vital signs reviewed and stable Respiratory status: spontaneous breathing, nonlabored ventilation, respiratory function stable and patient connected to nasal cannula oxygen Cardiovascular status: blood pressure returned to baseline and stable Postop Assessment: no apparent nausea or vomiting Anesthetic complications: no   No notable events documented.  Last Vitals:  Vitals:   04/08/23 0355 04/08/23 0745  BP: 119/61 124/60  Pulse: 60 64  Resp: 18 16  Temp: 36.7 C 36.7 C  SpO2: 94% 98%    Last Pain:  Vitals:   04/08/23 0925  TempSrc:   PainSc: 5                  Kennieth Rad

## 2023-04-08 NOTE — Discharge Summary (Signed)
  Physician Discharge Summary  Patient ID: Alejandro Krueger MRN: [id number] DOB/AGE: 1941/04/19 82 y.o. Estimated body mass index is 26.91 kg/m as calculated from the following:   Height as of this encounter: 5\' 6"  (1.676 m).   Weight as of this encounter: 75.6 kg.   Admit date: 04/07/2023 Discharge date: 04/08/2023  Admission Diagnoses: Herniated nucleus pulposus L4-5 right  Discharge Diagnoses: Same Principal Problem:   HNP (herniated nucleus pulposus), lumbar   Discharged Condition: good  Hospital Course: Admitted underwent lumbar microdiscectomy L4-5 in the right postoperative patient did very well a code in the floor on the floor was ambulating voiding spontaneously tolerating regular diet and stable for discharge home.  Consults: Significant Diagnostic Studies: Treatments: Microdiscectomy L4-5 right Discharge Exam: Blood pressure 124/60, pulse 64, temperature 98 F (36.7 C), temperature source Oral, resp. rate 16, height 5\' 6"  (1.676 m), weight 75.6 kg, SpO2 98%. Strength 5 out of 5 wound clean dry and intact  Disposition: Home   Allergies as of 04/08/2023   No Known Allergies      Medication List     TAKE these medications    amLODipine 5 MG tablet Commonly known as: NORVASC Take 5 mg by mouth in the morning.   buPROPion 150 MG 24 hr tablet Commonly known as: WELLBUTRIN XL Take 150 mg by mouth in the morning.   HYDROcodone-acetaminophen 5-325 MG tablet Commonly known as: NORCO/VICODIN Take 1-2 tablets by mouth every 4 (four) hours as needed for severe pain ((score 7 to 10)).   ibuprofen 800 MG tablet Commonly known as: ADVIL Take 1 tablet (800 mg total) by mouth every 8 (eight) hours as needed.   lisinopril 20 MG tablet Commonly known as: ZESTRIL Take 20 mg by mouth in the morning.   metFORMIN 500 MG 24 hr tablet Commonly known as: GLUCOPHAGE-XR Take 500 mg by mouth daily.   omeprazole 20 MG capsule Commonly known as: PRILOSEC Take 20 mg by  mouth daily.   pravastatin 20 MG tablet Commonly known as: PRAVACHOL Take 20 mg by mouth in the morning.         Signed: Mariam Dollar 04/08/2023, 8:10 AM

## 2023-04-08 NOTE — Plan of Care (Signed)
  Problem: Bowel/Gastric: Goal: Gastrointestinal status for postoperative course will improve Outcome: Completed/Met   Problem: Clinical Measurements: Goal: Ability to maintain clinical measurements within normal limits will improve Outcome: Completed/Met Goal: Postoperative complications will be avoided or minimized Outcome: Completed/Met Goal: Diagnostic test results will improve Outcome: Completed/Met   Problem: Pain Management: Goal: Pain level will decrease Outcome: Completed/Met   Problem: Skin Integrity: Goal: Will show signs of wound healing Outcome: Completed/Met   Problem: Health Behavior/Discharge Planning: Goal: Identification of resources available to assist in meeting health care needs will improve Outcome: Completed/Met   Problem: Bladder/Genitourinary: Goal: Urinary functional status for postoperative course will improve Outcome: Completed/Met

## 2023-04-08 NOTE — Progress Notes (Signed)
Patient alert and oriented, ambulate, void, surgical site clean and dry. D/c instructions explain and given all questions answered. Patient d/c home per order.

## 2023-04-08 NOTE — Evaluation (Signed)
Occupational Therapy Evaluation Patient Details Name: Alejandro Krueger MRN: [id number] DOB: 06/07/1941 Today's Date: 04/08/2023   History of Present Illness 82 yo M s/p lumbar microdiscectomy L4-5.  PMH includes: HTN, GERD, DMT2   Clinical Impression   Patient admitted for the diagnosis above.  PTA he lives with his spouse, who can assist as needed.  Patient is very close to his baseline needing no assist for ADL completion or in room/hallway mobility.  Patient verbalized understanding of all precautions, all questions answered, and no further needs in the acute setting.  Follow up with MD as prescribed.         If plan is discharge home, recommend the following: Assist for transportation;Assistance with cooking/housework    Functional Status Assessment  Patient has not had a recent decline in their functional status  Equipment Recommendations  None recommended by OT    Recommendations for Other Services       Precautions / Restrictions Precautions Precautions: Back Precaution Booklet Issued: No Precaution Comments: Limit bending, lifting and twisting Restrictions Weight Bearing Restrictions: No      Mobility Bed Mobility Overal bed mobility: Modified Independent                  Transfers Overall transfer level: Independent                        Balance Overall balance assessment: No apparent balance deficits (not formally assessed)                                         ADL either performed or assessed with clinical judgement   ADL Overall ADL's : At baseline                                             Vision Baseline Vision/History: 1 Wears glasses Patient Visual Report: No change from baseline       Perception Perception: Within Functional Limits       Praxis Praxis: WFL       Pertinent Vitals/Pain Pain Assessment Pain Assessment: Faces Faces Pain Scale: Hurts a little bit Pain Location:  Incisional Pain Descriptors / Indicators: Tender Pain Intervention(s): Monitored during session     Extremity/Trunk Assessment Upper Extremity Assessment Upper Extremity Assessment: Overall WFL for tasks assessed   Lower Extremity Assessment Lower Extremity Assessment: Overall WFL for tasks assessed   Cervical / Trunk Assessment Cervical / Trunk Assessment: Back Surgery   Communication Communication Communication: No apparent difficulties   Cognition Arousal: Alert Behavior During Therapy: WFL for tasks assessed/performed Overall Cognitive Status: Within Functional Limits for tasks assessed                                       General Comments   VSS    Exercises     Shoulder Instructions      Home Living Family/patient expects to be discharged to:: Private residence Living Arrangements: Spouse/significant other;Children Available Help at Discharge: Family;Available 24 hours/day Type of Home: House Home Access: Stairs to enter [employer] of Steps: 4 Entrance Stairs-Rails: Right;Left;Can reach both Home Layout: One level     Bathroom Shower/Tub: Tub/shower  unit;Walk-in shower   Bathroom Toilet: Standard Bathroom Accessibility: Yes How Accessible: Accessible via walker Home Equipment: None          Prior Functioning/Environment Prior Level of Function : Independent/Modified Independent;Driving                        OT Problem List: Pain      OT Treatment/Interventions:      OT Goals(Current goals can be found in the care plan section) Acute Rehab OT Goals Patient Stated Goal: Return home OT Goal Formulation: With patient Time For Goal Achievement: 04/11/23 Potential to Achieve Goals: Good  OT Frequency:      Co-evaluation              AM-PAC OT "6 Clicks" Daily Activity     Outcome Measure Help from another person eating meals?: None Help from another person taking care of personal grooming?: None Help  from another person toileting, which includes using toliet, bedpan, or urinal?: None Help from another person bathing (including washing, rinsing, drying)?: None Help from another person to put on and taking off regular upper body clothing?: None Help from another person to put on and taking off regular lower body clothing?: None 6 Click Score: 24   End of Session Nurse Communication: Mobility status  Activity Tolerance: Patient tolerated treatment well Patient left: in bed;with call bell/phone within reach;with family/visitor present  OT Visit Diagnosis: Unsteadiness on feet (R26.81)                Time: 4098-1191 OT Time Calculation (min): 19 min Charges:  OT General Charges $OT Visit: 1 Visit OT Evaluation $OT Eval Moderate Complexity: 1 Mod  04/08/2023  RP, OTR/L  Acute Rehabilitation Services  Office:  [phone]   Suzanna Obey 04/08/2023, 8:59 AM

## 2023-05-23 DIAGNOSIS — E782 Mixed hyperlipidemia: Secondary | ICD-10-CM | POA: Diagnosis not present

## 2023-05-23 DIAGNOSIS — E1122 Type 2 diabetes mellitus with diabetic chronic kidney disease: Secondary | ICD-10-CM | POA: Diagnosis not present

## 2023-05-29 DIAGNOSIS — D509 Iron deficiency anemia, unspecified: Secondary | ICD-10-CM | POA: Diagnosis not present

## 2023-05-29 DIAGNOSIS — I129 Hypertensive chronic kidney disease with stage 1 through stage 4 chronic kidney disease, or unspecified chronic kidney disease: Secondary | ICD-10-CM | POA: Diagnosis not present

## 2023-05-29 DIAGNOSIS — Z96649 Presence of unspecified artificial hip joint: Secondary | ICD-10-CM | POA: Diagnosis not present

## 2023-05-29 DIAGNOSIS — Z23 Encounter for immunization: Secondary | ICD-10-CM | POA: Diagnosis not present

## 2023-05-29 DIAGNOSIS — I10 Essential (primary) hypertension: Secondary | ICD-10-CM | POA: Diagnosis not present

## 2023-05-29 DIAGNOSIS — H269 Unspecified cataract: Secondary | ICD-10-CM | POA: Diagnosis not present

## 2023-05-29 DIAGNOSIS — E1122 Type 2 diabetes mellitus with diabetic chronic kidney disease: Secondary | ICD-10-CM | POA: Diagnosis not present

## 2023-05-29 DIAGNOSIS — M5441 Lumbago with sciatica, right side: Secondary | ICD-10-CM | POA: Diagnosis not present

## 2023-05-29 DIAGNOSIS — R809 Proteinuria, unspecified: Secondary | ICD-10-CM | POA: Diagnosis not present

## 2023-05-29 DIAGNOSIS — N1831 Chronic kidney disease, stage 3a: Secondary | ICD-10-CM | POA: Diagnosis not present

## 2023-05-29 DIAGNOSIS — E782 Mixed hyperlipidemia: Secondary | ICD-10-CM | POA: Diagnosis not present

## 2023-06-05 NOTE — Progress Notes (Signed)
[facility] - Clinic Note  06/09/2023     CHIEF COMPLAINT Patient presents for Retina Follow Up   HISTORY OF PRESENT ILLNESS: Alejandro Krueger is a 82 y.o. male who presents to the clinic today for:   HPI     Retina Follow Up   Patient presents with  Dry AMD.  In both eyes.  This started 4 months ago.  I, the attending physician,  performed the HPI with the patient and updated documentation appropriately.        Comments   Patient here for 4 months retina follow up for non exu ARMD OU. Patient states vision doing pretty good. No eye pain.       Last edited by Rennis Chris, MD on 06/09/2023 11:57 AM.    Pt states vision is stable, no new health problems, he had a ruptured disc a  few months ago and had sx on it  Referring physician: Benita Stabile, MD [address] [place],  [place] [address]  HISTORICAL INFORMATION:   Selected notes from the MEDICAL RECORD NUMBER Referred by Dr. Charise Killian for RPE changes OD LEE:  Ocular Hx- PMH-    CURRENT MEDICATIONS: No current outpatient medications on file. (Ophthalmic Drugs)   No current facility-administered medications for this visit. (Ophthalmic Drugs)   Current Outpatient Medications (Other)  Medication Sig   amLODipine (NORVASC) 5 MG tablet Take 5 mg by mouth in the morning.   buPROPion (WELLBUTRIN XL) 150 MG 24 hr tablet Take 150 mg by mouth in the morning.   HYDROcodone-acetaminophen (NORCO/VICODIN) 5-325 MG tablet Take 1-2 tablets by mouth every 4 (four) hours as needed for severe pain ((score 7 to 10)).   lisinopril (ZESTRIL) 20 MG tablet Take 20 mg by mouth in the morning.   metFORMIN (GLUCOPHAGE-XR) 500 MG 24 hr tablet Take 500 mg by mouth daily.   omeprazole (PRILOSEC) 20 MG capsule Take 20 mg by mouth daily.   pravastatin (PRAVACHOL) 20 MG tablet Take 20 mg by mouth in the morning.   ibuprofen (ADVIL) 800 MG tablet Take 1 tablet (800 mg total) by mouth every 8 (eight) hours as needed. (Patient  not taking: Reported on 03/27/2023)   No current facility-administered medications for this visit. (Other)   REVIEW OF SYSTEMS: ROS   Positive for: Endocrine, Cardiovascular, Eyes Last edited by Laddie Aquas, COA on 06/09/2023  8:44 AM.     ALLERGIES No Known Allergies  PAST MEDICAL HISTORY Past Medical History:  Diagnosis Date   Depression    Diabetes mellitus without complication (HCC)    type 2   GERD (gastroesophageal reflux disease)    HLD (hyperlipidemia)    Hx of adenomatous colonic polyps    Hypertension    Prostate cancer (HCC)    Umbilical hernia with obstruction    Past Surgical History:  Procedure Laterality Date   COLONOSCOPY  06/23/2007   [id number] polyps removed (cecum and hepatic flexure). The remainder of the colonic mucosa appeared normal/Left-sided diverticula/Normal rectum (tubular adenomas)   COLONOSCOPY N/A 01/05/2014   Procedure: COLONOSCOPY;  Surgeon: Corbin Ade, MD;  Location: [facility];  Service: Endoscopy;  Laterality: N/A;  9:30   ESOPHAGOGASTRODUODENOSCOPY N/A 01/05/2014   Procedure: ESOPHAGOGASTRODUODENOSCOPY (EGD);  Surgeon: Corbin Ade, MD;  Location: [facility];  Service: Endoscopy;  Laterality: N/A;   HARDWARE REMOVAL Right 11/17/2020   Procedure: RIGHT HIP HARDWARE REMOVAL;  Surgeon: Vickki Hearing, MD;  Location: [facility];  Service: Orthopedics;  Laterality: Right;   HIP PINNING,CANNULATED Right 11/09/2019   Procedure: CANNULATED HIP PINNING;  Surgeon: Vickki Hearing, MD;  Location: [facility];  Service: Orthopedics;  Laterality: Right;   KNEE ARTHROSCOPY  2003   right   LUMBAR LAMINECTOMY/DECOMPRESSION MICRODISCECTOMY Right 04/07/2023   Procedure: Microdiscectomy - right - Lumbar four-Lumbar five;  Surgeon: Donalee Citrin, MD;  Location: [facility] OR;  Service: Neurosurgery;  Laterality: Right;   prostate biopsy  2016   RADIOACTIVE SEED IMPLANT N/A 06/02/2015   Procedure: RADIOACTIVE SEED IMPLANT/BRACHYTHERAPY IMPLANT;  Surgeon:  Jerilee Field, MD;  Location: [facility];  Service: Urology;  Laterality: N/A;  63 seeds implanted no seeds in bladder   UMBILICAL HERNIA REPAIR N/A 02/12/2017   Procedure: HERNIA REPAIR UMBILICAL ADULT WITH MESH;  Surgeon: Franky Macho, MD;  Location: [facility];  Service: General;  Laterality: N/A;   FAMILY HISTORY Family History  Problem Relation Age of Onset   Diabetes Mother    Heart attack Father    Colon cancer Neg Hx    SOCIAL HISTORY Social History   Tobacco Use   Smoking status: Never   Smokeless tobacco: Never  Vaping Use   Vaping status: Never Used  Substance Use Topics   Alcohol use: No   Drug use: No       OPHTHALMIC EXAM:  Base Eye Exam     Visual Acuity (Snellen - Linear)       Right Left   Dist [place] 20/20 -2 20/20 -2         Tonometry (Tonopen, 8:40 AM)       Right Left   Pressure 15 16         Pupils       Dark Light Shape React APD   Right 3 2 Round Brisk None   Left 3 2 Round Brisk None         Visual Fields (Counting fingers)       Left Right    Full Full         Extraocular Movement       Right Left    Full, Ortho Full, Ortho         Neuro/Psych     Oriented x3: Yes   Mood/Affect: Normal         Dilation     Both eyes: 1.0% Mydriacyl, 2.5% Phenylephrine @ 8:40 AM           Slit Lamp and Fundus Exam     Slit Lamp Exam       Right Left   Lids/Lashes Dermatochalasis - upper lid, Meibomian gland dysfunction Dermatochalasis - upper lid, mild MGD   Conjunctiva/Sclera Inferior Conjunctivochalasis White and quiet, temp pinguecula   Cornea Trace tear film debris, well healed cataract wound Mild tear film debris, well healed cataract wound, fine endo pigment   Anterior Chamber Deep and Clear Moderate depth, narrow temporal angle, clear   Iris Round and poorly dilated to 4mm Round and poorly dilated to 4.75mm, Transillumination defects at 0300   Lens PC IOL in good position PC IOL in good position    Anterior Vitreous Mild syneresis Posterior vitreous detachment, mild syneresis         Fundus Exam       Right Left   Disc Pink and sharp, Temporal PPA, peripapillary cystic changes nasal mac mild Pallor, Sharp rim   C/D Ratio 0.6 0.6   Macula Flat, blunted foveal reflex, fine drusen, RPE  mottling and clumping, mild peripapillary cystic changes nasal mac, no heme Flat, blunted foveal reflex, drusen, RPE mottling, no heme or edema.   Vessels Attenuated, Tortuous Attenuated, Tortuous   Periphery Attached, no heme Attached, inferior paving stone degeneration, No heme           Refraction     Wearing Rx       Sphere Cylinder Axis Add   Right +1.75 +1.25 010 +2.50   Left +1.50 +1.75 171            IMAGING AND PROCEDURES  Imaging and Procedures for 06/09/2023  OCT, Retina - OU - Both Eyes       Right Eye Quality was good. Central Foveal Thickness: 319. Progression has worsened. Findings include normal foveal contour, no SRF, retinal drusen , subretinal hyper-reflective material, intraretinal fluid, outer retinal atrophy, vitreomacular adhesion (Interval increase in peripapillary IRF nasal mac, central vitelliform like lesion w/ stable improvement in surrounding ellipsoid signal / ORA, mild drusen).   Left Eye Quality was good. Central Foveal Thickness: 336. Progression has been stable. Findings include normal foveal contour, no IRF, no SRF (Partial PVD, trace peripapillary cystic changes).   Notes *Images captured and stored on drive  Diagnosis / Impression:  OD: Interval increase in peripapillary IRF nasal mac, central vitelliform like lesion w/ stable improvement in surrounding ellipsoid signal / ORA, mild drusen OS: NFP; no IRF/SRF; partial PVD, trace peripapillary cystic changes   Clinical management:  See below  Abbreviations: NFP - Normal foveal profile. CME - cystoid macular edema. PED - pigment epithelial detachment. IRF - intraretinal fluid. SRF -  subretinal fluid. EZ - ellipsoid zone. ERM - epiretinal membrane. ORA - outer retinal atrophy. ORT - outer retinal tubulation. SRHM - subretinal hyper-reflective material. IRHM - intraretinal hyper-reflective material            ASSESSMENT/PLAN:    ICD-10-CM   1. Intermediate stage nonexudative age-related macular degeneration of both eyes  H35.3132 OCT, Retina - OU - Both Eyes    2. Essential hypertension  I10     3. Hypertensive retinopathy of both eyes  H35.033     4. Pseudophakia, both eyes  Z96.1      Age related macular degeneration, non-exudative, OU - intermediate stage OU - FA (1.22.24) OD: Patchy staining nasal mac, central blockage. ?CNV, OS: No CNV - OCT shows OD: Interval increase in peripapillary IRF nasal mac, central vitelliform like lesion w/ interval improvement in surrounding ORA, mild dusen; OS: NFP; no IRF/SRF; partial PVD - BCVA remains 20/20 OU  - continue amsler grid monitoring  - will continue to hold off on treatment and monitor closely  - f/u 2-3 months -- DFE/OCT  2,3. Hypertensive retinopathy OU - discussed importance of tight BP control - monitor  4. Pseudophakia OU  - s/p CE/IOL OU (Dr. Evonnie Pat: OD: 04.19.24, OS: 05.03.24)  - IOLs in good position, doing well  - monitor   Ophthalmic Meds Ordered this visit:  No orders of the defined types were placed in this encounter.    Return for f/u 2-3 months, non-exu ARMD OU, DFE, OCT.  There are no Patient Instructions on file for this visit.   Explained the diagnoses, plan, and follow up with the patient and they expressed understanding.  Patient expressed understanding of the importance of proper follow up care.   This document serves as a record of services personally performed by Karie Chimera, MD, PhD. It was created on their behalf by Nelma Rothman  Benna Dunks, COMT. The creation of this record is the provider's dictation and/or activities during the visit.  Electronically signed by: Annalee Genta,  COMT 06/09/23 12:00 PM  This document serves as a record of services personally performed by Karie Chimera, MD, PhD. It was created on their behalf by Glee Arvin. Manson Passey, OA an ophthalmic technician. The creation of this record is the provider's dictation and/or activities during the visit.    Electronically signed by: Glee Arvin. Manson Passey, OA 06/09/23 12:00 PM   Karie Chimera, M.D., Ph.D. Diseases & Surgery of the Retina and Vitreous [facility]  I have reviewed the above documentation for accuracy and completeness, and I agree with the above. Karie Chimera, M.D., Ph.D. 06/09/23 12:03 PM  Abbreviations: M myopia (nearsighted); A astigmatism; H hyperopia (farsighted); P presbyopia; Mrx spectacle prescription;  CTL contact lenses; OD right eye; OS left eye; OU both eyes  XT exotropia; ET esotropia; PEK punctate epithelial keratitis; PEE punctate epithelial erosions; DES dry eye syndrome; MGD meibomian gland dysfunction; ATs artificial tears; PFAT's preservative free artificial tears; NSC nuclear sclerotic cataract; PSC posterior subcapsular cataract; ERM epi-retinal membrane; PVD posterior vitreous detachment; RD retinal detachment; DM diabetes mellitus; DR diabetic retinopathy; NPDR non-proliferative diabetic retinopathy; PDR proliferative diabetic retinopathy; CSME clinically significant macular edema; DME diabetic macular edema; dbh dot blot hemorrhages; CWS cotton wool spot; POAG primary open angle glaucoma; C/D cup-to-disc ratio; HVF humphrey visual field; GVF goldmann visual field; OCT optical coherence tomography; IOP intraocular pressure; BRVO Branch retinal vein occlusion; CRVO central retinal vein occlusion; CRAO central retinal artery occlusion; BRAO branch retinal artery occlusion; RT retinal tear; SB scleral buckle; PPV pars plana vitrectomy; VH Vitreous hemorrhage; PRP panretinal laser photocoagulation; IVK intravitreal kenalog; VMT vitreomacular traction; MH Macular  hole;  NVD neovascularization of the disc; NVE neovascularization elsewhere; AREDS age related eye disease study; ARMD age related macular degeneration; POAG primary open angle glaucoma; EBMD epithelial/anterior basement membrane dystrophy; ACIOL anterior chamber intraocular lens; IOL intraocular lens; PCIOL posterior chamber intraocular lens; Phaco/IOL phacoemulsification with intraocular lens placement; PRK photorefractive keratectomy; LASIK laser assisted in situ keratomileusis; HTN hypertension; DM diabetes mellitus; COPD chronic obstructive pulmonary disease

## 2023-06-09 ENCOUNTER — Encounter (INDEPENDENT_AMBULATORY_CARE_PROVIDER_SITE_OTHER): Payer: Self-pay | Admitting: Ophthalmology

## 2023-06-09 ENCOUNTER — Ambulatory Visit (INDEPENDENT_AMBULATORY_CARE_PROVIDER_SITE_OTHER): Payer: Medicare Other | Admitting: Ophthalmology

## 2023-06-09 DIAGNOSIS — I10 Essential (primary) hypertension: Secondary | ICD-10-CM

## 2023-06-09 DIAGNOSIS — Z961 Presence of intraocular lens: Secondary | ICD-10-CM | POA: Diagnosis not present

## 2023-06-09 DIAGNOSIS — H353132 Nonexudative age-related macular degeneration, bilateral, intermediate dry stage: Secondary | ICD-10-CM | POA: Diagnosis not present

## 2023-06-09 DIAGNOSIS — H35033 Hypertensive retinopathy, bilateral: Secondary | ICD-10-CM

## 2023-06-23 DIAGNOSIS — R350 Frequency of micturition: Secondary | ICD-10-CM | POA: Diagnosis not present

## 2023-06-23 DIAGNOSIS — N476 Balanoposthitis: Secondary | ICD-10-CM | POA: Diagnosis not present

## 2023-07-10 DIAGNOSIS — I10 Essential (primary) hypertension: Secondary | ICD-10-CM | POA: Diagnosis not present

## 2023-07-10 DIAGNOSIS — I129 Hypertensive chronic kidney disease with stage 1 through stage 4 chronic kidney disease, or unspecified chronic kidney disease: Secondary | ICD-10-CM | POA: Diagnosis not present

## 2023-07-10 DIAGNOSIS — D509 Iron deficiency anemia, unspecified: Secondary | ICD-10-CM | POA: Diagnosis not present

## 2023-07-10 DIAGNOSIS — R809 Proteinuria, unspecified: Secondary | ICD-10-CM | POA: Diagnosis not present

## 2023-07-10 DIAGNOSIS — K219 Gastro-esophageal reflux disease without esophagitis: Secondary | ICD-10-CM | POA: Diagnosis not present

## 2023-07-10 DIAGNOSIS — N1831 Chronic kidney disease, stage 3a: Secondary | ICD-10-CM | POA: Diagnosis not present

## 2023-07-10 DIAGNOSIS — L739 Follicular disorder, unspecified: Secondary | ICD-10-CM | POA: Diagnosis not present

## 2023-07-10 DIAGNOSIS — E782 Mixed hyperlipidemia: Secondary | ICD-10-CM | POA: Diagnosis not present

## 2023-08-26 NOTE — Progress Notes (Signed)
 [facility] - Clinic Note  09/09/2023     CHIEF COMPLAINT Patient presents for Retina Follow Up   HISTORY OF PRESENT ILLNESS: Alejandro Krueger is a 82 y.o. male who presents to the clinic today for:   HPI     Retina Follow Up   Patient presents with  Dry AMD.  In both eyes.  Severity is moderate.  Duration of 3 months.  Since onset it is stable.  I, the attending physician,  performed the HPI with the patient and updated documentation appropriately.        Comments   Pt here for 3 mo ret f/u non exu ARMD OU. Pt states VA is stable, no changes. Pt reports having cataract sx OU @ [facility] in [place]. Not using any gtts.       Last edited by Valdemar Rogue, MD on 09/11/2023  1:56 AM.    Pt states vision is pretty good, he saw Dr. Harrie last Thursday for a pressure check and said it was good    Referring physician: Shona Norleen PEDLAR, MD [address] [place],  [place] [address]  HISTORICAL INFORMATION:   Selected notes from the MEDICAL RECORD NUMBER Referred by Dr. Darroll for RPE changes OD LEE:  Ocular Hx- PMH-    CURRENT MEDICATIONS: No current outpatient medications on file. (Ophthalmic Drugs)   No current facility-administered medications for this visit. (Ophthalmic Drugs)   Current Outpatient Medications (Other)  Medication Sig   amLODipine  (NORVASC ) 5 MG tablet Take 5 mg by mouth in the morning.   buPROPion  (WELLBUTRIN  XL) 150 MG 24 hr tablet Take 150 mg by mouth in the morning.   HYDROcodone -acetaminophen  (NORCO/VICODIN) 5-325 MG tablet Take 1-2 tablets by mouth every 4 (four) hours as needed for severe pain ((score 7 to 10)).   lisinopril  (ZESTRIL ) 20 MG tablet Take 20 mg by mouth in the morning.   metFORMIN  (GLUCOPHAGE -XR) 500 MG 24 hr tablet Take 500 mg by mouth daily.   omeprazole (PRILOSEC) 20 MG capsule Take 20 mg by mouth daily.   pravastatin  (PRAVACHOL ) 20 MG tablet Take 20 mg by mouth in the morning.   ibuprofen  (ADVIL )  800 MG tablet Take 1 tablet (800 mg total) by mouth every 8 (eight) hours as needed. (Patient not taking: Reported on 03/27/2023)   No current facility-administered medications for this visit. (Other)   REVIEW OF SYSTEMS: ROS   Positive for: Endocrine, Cardiovascular, Eyes Negative for: Constitutional, Gastrointestinal, Neurological, Skin, Genitourinary, Musculoskeletal, HENT, Respiratory, Psychiatric, Allergic/Imm, Heme/Lymph Last edited by Antonetta Almetta BRAVO, COT on 09/09/2023  8:42 AM.      ALLERGIES No Known Allergies  PAST MEDICAL HISTORY Past Medical History:  Diagnosis Date   Depression    Diabetes mellitus without complication (HCC)    type 2   GERD (gastroesophageal reflux disease)    HLD (hyperlipidemia)    Hx of adenomatous colonic polyps    Hypertension    Prostate cancer (HCC)    Umbilical hernia with obstruction    Past Surgical History:  Procedure Laterality Date   COLONOSCOPY  06/23/2007   MFM:Floupeoz polyps removed (cecum and hepatic flexure). The remainder of the colonic mucosa appeared normal/Left-sided diverticula/Normal rectum (tubular adenomas)   COLONOSCOPY N/A 01/05/2014   Procedure: COLONOSCOPY;  Surgeon: Lamar CHRISTELLA Hollingshead, MD;  Location: [facility];  Service: Endoscopy;  Laterality: N/A;  9:30   ESOPHAGOGASTRODUODENOSCOPY N/A 01/05/2014   Procedure: ESOPHAGOGASTRODUODENOSCOPY (EGD);  Surgeon: Lamar CHRISTELLA Hollingshead,  MD;  Location: [facility];  Service: Endoscopy;  Laterality: N/A;   HARDWARE REMOVAL Right 11/17/2020   Procedure: RIGHT HIP HARDWARE REMOVAL;  Surgeon: Margrette Taft BRAVO, MD;  Location: [facility];  Service: Orthopedics;  Laterality: Right;   HIP PINNING,CANNULATED Right 11/09/2019   Procedure: CANNULATED HIP PINNING;  Surgeon: Margrette Taft BRAVO, MD;  Location: [facility];  Service: Orthopedics;  Laterality: Right;   KNEE ARTHROSCOPY  2003   right   LUMBAR LAMINECTOMY/DECOMPRESSION MICRODISCECTOMY Right 04/07/2023   Procedure: Microdiscectomy - right  - Lumbar four-Lumbar five;  Surgeon: Onetha Kuba, MD;  Location: [facility];  Service: Neurosurgery;  Laterality: Right;   prostate biopsy  2016   RADIOACTIVE SEED IMPLANT N/A 06/02/2015   Procedure: RADIOACTIVE SEED IMPLANT/BRACHYTHERAPY IMPLANT;  Surgeon: Donnice Brooks, MD;  Location: [facility];  Service: Urology;  Laterality: N/A;  63 seeds implanted no seeds in bladder   UMBILICAL HERNIA REPAIR N/A 02/12/2017   Procedure: HERNIA REPAIR UMBILICAL ADULT WITH MESH;  Surgeon: Mavis Anes, MD;  Location: [facility];  Service: General;  Laterality: N/A;   FAMILY HISTORY Family History  Problem Relation Age of Onset   Diabetes Mother    Heart attack Father    Colon cancer Neg Hx    SOCIAL HISTORY Social History   Tobacco Use   Smoking status: Never   Smokeless tobacco: Never  Vaping Use   Vaping status: Never Used  Substance Use Topics   Alcohol use: No   Drug use: No       OPHTHALMIC EXAM:  Base Eye Exam     Visual Acuity (Snellen - Linear)       Right Left   Dist [place] 20/25 -1 20/25 -2   Dist ph [place] NI 20/25         Tonometry (Tonopen, 8:47 AM)       Right Left   Pressure 11 13         Pupils       Pupils Dark Light Shape React APD   Right PERRL 3 2 Round Brisk None   Left PERRL 3 2 Round Brisk None         Visual Fields (Counting fingers)       Left Right    Full Full         Extraocular Movement       Right Left    Full, Ortho Full, Ortho         Neuro/Psych     Oriented x3: Yes   Mood/Affect: Normal         Dilation     Both eyes: 1.0% Mydriacyl , 2.5% Phenylephrine  @ 8:47 AM           Slit Lamp and Fundus Exam     Slit Lamp Exam       Right Left   Lids/Lashes Dermatochalasis - upper lid Dermatochalasis - upper lid, mild MGD   Conjunctiva/Sclera White and quiet White and quiet, temp pinguecula   Cornea Trace tear film debris, well healed cataract wound Mild tear film debris, well healed cataract wound, fine  endo pigment   Anterior Chamber Deep and Clear Moderate depth, narrow temporal angle, clear   Iris Round and poorly dilated to 4mm Round and poorly dilated to 4.25mm, Transillumination defects at 0300   Lens PC IOL in good position PC IOL in good position   Anterior Vitreous Mild syneresis Posterior vitreous detachment, mild syneresis  Fundus Exam       Right Left   Disc Pink and sharp, Temporal PPA, peripapillary cystic changes nasal mac -- slightly improved mild Pallor, Sharp rim   C/D Ratio 0.5 0.6   Macula Flat, blunted foveal reflex, fine drusen, RPE mottling and clumping, mild peripapillary cystic changes nasal mac -- slightly improved, no heme Flat, blunted foveal reflex, drusen, RPE mottling, no heme or edema.   Vessels Attenuated, Tortuous, mild copper wiring, mild AV crossing changes mild attenuation, mild tortuosity   Periphery Attached, no heme Attached, inferior paving stone degeneration, No heme           IMAGING AND PROCEDURES  Imaging and Procedures for 09/09/2023  OCT, Retina - OU - Both Eyes       Right Eye Quality was good. Central Foveal Thickness: 321. Progression has improved. Findings include normal foveal contour, no SRF, retinal drusen , subretinal hyper-reflective material, intraretinal fluid, outer retinal atrophy, vitreomacular adhesion (Interval improvement in peripapillary IRF nasal mac, central vitelliform like lesion w/ stable improvement in surrounding ellipsoid signal / ORA, mild drusen).   Left Eye Quality was good. Central Foveal Thickness: 325. Progression has been stable. Findings include normal foveal contour, no IRF, no SRF (Partial PVD, trace peripapillary cystic changes).   Notes *Images captured and stored on drive  Diagnosis / Impression:  OD: Interval improvement in peripapillary IRF nasal mac, central vitelliform like lesion w/ stable improvement in surrounding ellipsoid signal / ORA, mild drusen OS: NFP; no IRF/SRF; partial  PVD, trace peripapillary cystic changes   Clinical management:  See below  Abbreviations: NFP - Normal foveal profile. CME - cystoid macular edema. PED - pigment epithelial detachment. IRF - intraretinal fluid. SRF - subretinal fluid. EZ - ellipsoid zone. ERM - epiretinal membrane. ORA - outer retinal atrophy. ORT - outer retinal tubulation. SRHM - subretinal hyper-reflective material. IRHM - intraretinal hyper-reflective material            ASSESSMENT/PLAN:    ICD-10-CM   1. Intermediate stage nonexudative age-related macular degeneration of both eyes  H35.3132 OCT, Retina - OU - Both Eyes    2. Essential hypertension  I10     3. Hypertensive retinopathy of both eyes  H35.033     4. Pseudophakia, both eyes  Z96.1       Age related macular degeneration, non-exudative, OU - intermediate stage OU - FA (1.22.24) OD: Patchy staining nasal mac, central blockage. ?CNV, OS: No CNV - OCT shows OD: Interval improvement in peripapillary IRF nasal mac, central vitelliform like lesion w/ interval improvement in surrounding ORA, mild dusen; OS: NFP; no IRF/SRF; partial PVD - BCVA remains 20/20 OU  - continue amsler grid monitoring  - will continue to hold off on treatment and monitor closely  - f/u 4 months, sooner prn -- DFE/OCT  2,3. Hypertensive retinopathy OU - discussed importance of tight BP control - monitor  4. Pseudophakia OU  - s/p CE/IOL OU (Dr. Lorri: OD: 04.19.24, OS: 05.03.24)  - IOLs in good position, doing well  - monitor   Ophthalmic Meds Ordered this visit:  No orders of the defined types were placed in this encounter.    Return in about 4 months (around 01/07/2024) for f/u non-exu ARMD OU, DFE, OCT.  There are no Patient Instructions on file for this visit.   Explained the diagnoses, plan, and follow up with the patient and they expressed understanding.  Patient expressed understanding of the importance of proper follow up care.  This document serves  as a record of services personally performed by [place] JUDITHANN Hans, MD, PhD. It was created on their behalf by Wanda GEANNIE Keens, COT an ophthalmic technician. The creation of this record is the provider's dictation and/or activities during the visit.    Electronically signed by:  Wanda GEANNIE Keens, COT  09/11/23 1:57 AM  This document serves as a record of services personally performed by Redell JUDITHANN Hans, MD, PhD. It was created on their behalf by Alan PARAS. Delores, OA an ophthalmic technician. The creation of this record is the provider's dictation and/or activities during the visit.    Electronically signed by: Alan PARAS. Delores, OA 09/11/23 1:57 AM  [place] JUDITHANN Hans, M.D., Ph.D. Diseases & Surgery of the Retina and Vitreous [facility]  I have reviewed the above documentation for accuracy and completeness, and I agree with the above. [place] JUDITHANN Hans, M.D., Ph.D. 09/11/23 1:58 AM   Abbreviations: M myopia (nearsighted); A astigmatism; H hyperopia (farsighted); P presbyopia; Mrx spectacle prescription;  CTL contact lenses; OD right eye; OS left eye; OU both eyes  XT exotropia; ET esotropia; PEK punctate epithelial keratitis; PEE punctate epithelial erosions; DES dry eye syndrome; MGD meibomian gland dysfunction; ATs artificial tears; PFAT's preservative free artificial tears; NSC nuclear sclerotic cataract; PSC posterior subcapsular cataract; ERM epi-retinal membrane; PVD posterior vitreous detachment; RD retinal detachment; DM diabetes mellitus; DR diabetic retinopathy; NPDR non-proliferative diabetic retinopathy; PDR proliferative diabetic retinopathy; CSME clinically significant macular edema; DME diabetic macular edema; dbh dot blot hemorrhages; CWS cotton wool spot; POAG primary open angle glaucoma; C/D cup-to-disc ratio; HVF humphrey visual field; GVF goldmann visual field; OCT optical coherence tomography; IOP intraocular pressure; BRVO Branch retinal vein occlusion; CRVO  central retinal vein occlusion; CRAO central retinal artery occlusion; BRAO branch retinal artery occlusion; RT retinal tear; SB scleral buckle; PPV pars plana vitrectomy; VH Vitreous hemorrhage; PRP panretinal laser photocoagulation; IVK intravitreal kenalog; VMT vitreomacular traction; MH Macular hole;  NVD neovascularization of the disc; NVE neovascularization elsewhere; AREDS age related eye disease study; ARMD age related macular degeneration; POAG primary open angle glaucoma; EBMD epithelial/anterior basement membrane dystrophy; ACIOL anterior chamber intraocular lens; IOL intraocular lens; PCIOL posterior chamber intraocular lens; Phaco/IOL phacoemulsification with intraocular lens placement; PRK photorefractive keratectomy; LASIK laser assisted in situ keratomileusis; HTN hypertension; DM diabetes mellitus; COPD chronic obstructive pulmonary disease

## 2023-09-04 DIAGNOSIS — H01001 Unspecified blepharitis right upper eyelid: Secondary | ICD-10-CM | POA: Diagnosis not present

## 2023-09-04 DIAGNOSIS — H01002 Unspecified blepharitis right lower eyelid: Secondary | ICD-10-CM | POA: Diagnosis not present

## 2023-09-04 DIAGNOSIS — Z961 Presence of intraocular lens: Secondary | ICD-10-CM | POA: Diagnosis not present

## 2023-09-04 DIAGNOSIS — H40013 Open angle with borderline findings, low risk, bilateral: Secondary | ICD-10-CM | POA: Diagnosis not present

## 2023-09-09 ENCOUNTER — Encounter (INDEPENDENT_AMBULATORY_CARE_PROVIDER_SITE_OTHER): Payer: Self-pay | Admitting: Ophthalmology

## 2023-09-09 ENCOUNTER — Ambulatory Visit (INDEPENDENT_AMBULATORY_CARE_PROVIDER_SITE_OTHER): Payer: Medicare Other | Admitting: Ophthalmology

## 2023-09-09 DIAGNOSIS — H35033 Hypertensive retinopathy, bilateral: Secondary | ICD-10-CM | POA: Diagnosis not present

## 2023-09-09 DIAGNOSIS — I10 Essential (primary) hypertension: Secondary | ICD-10-CM | POA: Diagnosis not present

## 2023-09-09 DIAGNOSIS — Z961 Presence of intraocular lens: Secondary | ICD-10-CM

## 2023-09-09 DIAGNOSIS — H353132 Nonexudative age-related macular degeneration, bilateral, intermediate dry stage: Secondary | ICD-10-CM

## 2023-09-11 ENCOUNTER — Encounter (INDEPENDENT_AMBULATORY_CARE_PROVIDER_SITE_OTHER): Payer: Self-pay | Admitting: Ophthalmology

## 2023-09-17 ENCOUNTER — Encounter (HOSPITAL_COMMUNITY): Payer: Self-pay | Admitting: Emergency Medicine

## 2023-09-17 ENCOUNTER — Other Ambulatory Visit: Payer: Self-pay

## 2023-09-17 ENCOUNTER — Emergency Department (HOSPITAL_COMMUNITY): Payer: Medicare Other

## 2023-09-17 ENCOUNTER — Observation Stay (HOSPITAL_COMMUNITY)
Admission: EM | Admit: 2023-09-17 | Discharge: 2023-09-18 | Disposition: A | Discharge status: Home / Self Care | Payer: Medicare Other | Attending: Emergency Medicine | Admitting: Emergency Medicine

## 2023-09-17 DIAGNOSIS — E785 Hyperlipidemia, unspecified: Secondary | ICD-10-CM | POA: Diagnosis not present

## 2023-09-17 DIAGNOSIS — I10 Essential (primary) hypertension: Secondary | ICD-10-CM | POA: Diagnosis not present

## 2023-09-17 DIAGNOSIS — I4892 Unspecified atrial flutter: Secondary | ICD-10-CM | POA: Diagnosis not present

## 2023-09-17 DIAGNOSIS — S2241XA Multiple fractures of ribs, right side, initial encounter for closed fracture: Secondary | ICD-10-CM | POA: Diagnosis not present

## 2023-09-17 DIAGNOSIS — D72825 Bandemia: Secondary | ICD-10-CM | POA: Diagnosis not present

## 2023-09-17 DIAGNOSIS — E782 Mixed hyperlipidemia: Secondary | ICD-10-CM | POA: Diagnosis not present

## 2023-09-17 DIAGNOSIS — R609 Edema, unspecified: Secondary | ICD-10-CM | POA: Diagnosis not present

## 2023-09-17 DIAGNOSIS — R10811 Right upper quadrant abdominal tenderness: Secondary | ICD-10-CM | POA: Insufficient documentation

## 2023-09-17 DIAGNOSIS — S2239XA Fracture of one rib, unspecified side, initial encounter for closed fracture: Secondary | ICD-10-CM | POA: Diagnosis present

## 2023-09-17 DIAGNOSIS — S299XXA Unspecified injury of thorax, initial encounter: Secondary | ICD-10-CM | POA: Diagnosis present

## 2023-09-17 DIAGNOSIS — Z743 Need for continuous supervision: Secondary | ICD-10-CM | POA: Diagnosis not present

## 2023-09-17 DIAGNOSIS — J9859 Other diseases of mediastinum, not elsewhere classified: Secondary | ICD-10-CM | POA: Diagnosis not present

## 2023-09-17 DIAGNOSIS — W19XXXA Unspecified fall, initial encounter: Secondary | ICD-10-CM | POA: Diagnosis not present

## 2023-09-17 DIAGNOSIS — R0989 Other specified symptoms and signs involving the circulatory and respiratory systems: Secondary | ICD-10-CM | POA: Diagnosis not present

## 2023-09-17 DIAGNOSIS — Z8546 Personal history of malignant neoplasm of prostate: Secondary | ICD-10-CM | POA: Insufficient documentation

## 2023-09-17 DIAGNOSIS — D72829 Elevated white blood cell count, unspecified: Secondary | ICD-10-CM | POA: Diagnosis not present

## 2023-09-17 DIAGNOSIS — Z79899 Other long term (current) drug therapy: Secondary | ICD-10-CM | POA: Diagnosis not present

## 2023-09-17 DIAGNOSIS — I480 Paroxysmal atrial fibrillation: Secondary | ICD-10-CM | POA: Insufficient documentation

## 2023-09-17 DIAGNOSIS — R0781 Pleurodynia: Secondary | ICD-10-CM | POA: Diagnosis not present

## 2023-09-17 DIAGNOSIS — R0789 Other chest pain: Secondary | ICD-10-CM | POA: Diagnosis not present

## 2023-09-17 DIAGNOSIS — K219 Gastro-esophageal reflux disease without esophagitis: Secondary | ICD-10-CM | POA: Diagnosis present

## 2023-09-17 DIAGNOSIS — E1165 Type 2 diabetes mellitus with hyperglycemia: Secondary | ICD-10-CM | POA: Diagnosis not present

## 2023-09-17 DIAGNOSIS — R7989 Other specified abnormal findings of blood chemistry: Secondary | ICD-10-CM | POA: Diagnosis not present

## 2023-09-17 DIAGNOSIS — I4891 Unspecified atrial fibrillation: Secondary | ICD-10-CM

## 2023-09-17 DIAGNOSIS — S2020XA Contusion of thorax, unspecified, initial encounter: Secondary | ICD-10-CM | POA: Diagnosis not present

## 2023-09-17 DIAGNOSIS — I713 Abdominal aortic aneurysm, ruptured, unspecified: Secondary | ICD-10-CM | POA: Insufficient documentation

## 2023-09-17 DIAGNOSIS — I714 Abdominal aortic aneurysm, without rupture, unspecified: Secondary | ICD-10-CM | POA: Diagnosis not present

## 2023-09-17 DIAGNOSIS — K449 Diaphragmatic hernia without obstruction or gangrene: Secondary | ICD-10-CM | POA: Diagnosis not present

## 2023-09-17 DIAGNOSIS — K862 Cyst of pancreas: Secondary | ICD-10-CM | POA: Diagnosis not present

## 2023-09-17 DIAGNOSIS — I7143 Infrarenal abdominal aortic aneurysm, without rupture: Secondary | ICD-10-CM | POA: Diagnosis not present

## 2023-09-17 DIAGNOSIS — I499 Cardiac arrhythmia, unspecified: Secondary | ICD-10-CM | POA: Diagnosis not present

## 2023-09-17 DIAGNOSIS — S3991XA Unspecified injury of abdomen, initial encounter: Secondary | ICD-10-CM | POA: Diagnosis not present

## 2023-09-17 LAB — CBC WITH DIFFERENTIAL/PLATELET
Abs Immature Granulocytes: 0.09 10*3/uL — ABNORMAL HIGH (ref 0.00–0.07)
Basophils Absolute: 0.1 10*3/uL (ref 0.0–0.1)
Basophils Relative: 0 %
Eosinophils Absolute: 0 10*3/uL (ref 0.0–0.5)
Eosinophils Relative: 0 %
HCT: 46.3 % (ref 39.0–52.0)
Hemoglobin: 14.5 g/dL (ref 13.0–17.0)
Immature Granulocytes: 1 %
Lymphocytes Relative: 11 %
Lymphs Abs: 1.9 10*3/uL (ref 0.7–4.0)
MCH: 27.7 pg (ref 26.0–34.0)
MCHC: 31.3 g/dL (ref 30.0–36.0)
MCV: 88.4 fL (ref 80.0–100.0)
Monocytes Absolute: 0.8 10*3/uL (ref 0.1–1.0)
Monocytes Relative: 4 %
Neutro Abs: 14.4 10*3/uL — ABNORMAL HIGH (ref 1.7–7.7)
Neutrophils Relative %: 84 %
Platelets: 241 10*3/uL (ref 150–400)
RBC: 5.24 MIL/uL (ref 4.22–5.81)
RDW: 21.4 % — ABNORMAL HIGH (ref 11.5–15.5)
WBC: 17.3 10*3/uL — ABNORMAL HIGH (ref 4.0–10.5)
nRBC: 0 % (ref 0.0–0.2)

## 2023-09-17 LAB — BASIC METABOLIC PANEL
Anion gap: 9 (ref 5–15)
BUN: 21 mg/dL (ref 8–23)
CO2: 23 mmol/L (ref 22–32)
Calcium: 8.8 mg/dL — ABNORMAL LOW (ref 8.9–10.3)
Chloride: 107 mmol/L (ref 98–111)
Creatinine, Ser: 1.45 mg/dL — ABNORMAL HIGH (ref 0.61–1.24)
GFR, Estimated: 48 mL/min — ABNORMAL LOW (ref >60)
Glucose, Bld: 151 mg/dL — ABNORMAL HIGH (ref 70–99)
Potassium: 4.3 mmol/L (ref 3.5–5.1)
Sodium: 139 mmol/L (ref 135–145)

## 2023-09-17 MED ORDER — DILTIAZEM HCL-DEXTROSE 125-5 MG/125ML-% IV SOLN (PREMIX)
5.0000 mg/h | INTRAVENOUS | Status: DC
  Administered 2023-09-17: 5 mg/h via INTRAVENOUS
  Filled 2023-09-17: qty 125

## 2023-09-17 MED ORDER — SODIUM CHLORIDE 0.9 % IV BOLUS
500.0000 mL | Freq: Once | INTRAVENOUS | Status: AC
  Administered 2023-09-17: 500 mL via INTRAVENOUS

## 2023-09-17 MED ORDER — FENTANYL CITRATE PF 50 MCG/ML IJ SOSY
50.0000 ug | PREFILLED_SYRINGE | Freq: Once | INTRAMUSCULAR | Status: AC
  Administered 2023-09-17: 50 ug via INTRAVENOUS
  Filled 2023-09-17: qty 1

## 2023-09-17 MED ORDER — KETOROLAC TROMETHAMINE 15 MG/ML IJ SOLN
15.0000 mg | Freq: Once | INTRAMUSCULAR | Status: AC
  Administered 2023-09-17: 15 mg via INTRAVENOUS
  Filled 2023-09-17: qty 1

## 2023-09-17 MED ORDER — IOHEXOL 300 MG/ML  SOLN
80.0000 mL | Freq: Once | INTRAMUSCULAR | Status: AC | PRN
  Administered 2023-09-17: 80 mL via INTRAVENOUS

## 2023-09-17 MED ORDER — OXYCODONE-ACETAMINOPHEN 5-325 MG PO TABS
1.0000 | ORAL_TABLET | Freq: Once | ORAL | Status: AC
  Administered 2023-09-17: 1 via ORAL
  Filled 2023-09-17: qty 1

## 2023-09-17 MED ORDER — DILTIAZEM LOAD VIA INFUSION
10.0000 mg | Freq: Once | INTRAVENOUS | Status: AC
  Administered 2023-09-17: 10 mg via INTRAVENOUS
  Filled 2023-09-17: qty 10

## 2023-09-17 MED ORDER — HEPARIN (PORCINE) 25000 UT/250ML-% IV SOLN
1100.0000 [IU]/h | INTRAVENOUS | Status: DC
Start: 2023-09-17 — End: 2023-09-18
  Administered 2023-09-18: 1100 [IU]/h via INTRAVENOUS
  Filled 2023-09-17: qty 250

## 2023-09-17 MED ORDER — ACETAMINOPHEN 325 MG PO TABS
650.0000 mg | ORAL_TABLET | Freq: Once | ORAL | Status: AC
  Administered 2023-09-17: 650 mg via ORAL
  Filled 2023-09-17: qty 2

## 2023-09-17 MED ORDER — HEPARIN BOLUS VIA INFUSION
4000.0000 [IU] | Freq: Once | INTRAVENOUS | Status: AC
  Administered 2023-09-18: 4000 [IU] via INTRAVENOUS

## 2023-09-17 MED ORDER — FENTANYL CITRATE PF 50 MCG/ML IJ SOSY
25.0000 ug | PREFILLED_SYRINGE | Freq: Once | INTRAMUSCULAR | Status: AC
  Administered 2023-09-17: 25 ug via INTRAVENOUS
  Filled 2023-09-17: qty 1

## 2023-09-17 NOTE — ED Notes (Signed)
Patient sitting on side of bed using urinal

## 2023-09-17 NOTE — ED Notes (Signed)
RT called for IS.

## 2023-09-17 NOTE — H&P (Incomplete)
History and Physical    Patient: Alejandro Krueger ZOX:[id number] DOB: 05-08-1941 DOA: 09/17/2023 DOS: the patient was seen and examined on 09/18/2023 PCP: Benita Stabile, MD  Patient coming from: Home  Chief Complaint:  Chief Complaint  Patient presents with   Fall   HPI: LOVIE ERBEN is a 83 y.o. male with medical history significant of hypertension, type 2 diabetes mellitus, hyperlipidemia, GERD who presents to the emergency department after tripping while carrying a wood, he said that he fell into a plastic container outside and the wood he was carrying also hit right side of his rib cage.  He complained of worsening pain on taking deep breath.  ED Course:  In the emergency department, pulse was 50 beats minute on arrival to the ED, respiratory rate was 22/min and other vital signs were within normal range.  Workup in the ED showed normal CBC except for WBC of 17.3 BMP was normal except for blood glucose of 151, creatinine 1.45 (this was 1.3 MI 5 months ago).  Troponin 41 > 64 CT chest, abdomen and pelvis with contrast showed displaced right anterior seventh and eighth rib fractures, with overlying chest wall contusion.  No other acute intrathoracic, intra-abdominal, or intrapelvic trauma. He was treated with Tylenol, Dilaudid, Toradol, Percocet. While in the ED patient went into paroxysmal atrial flutter and he was started on IV Cardizem drip and heparin drip.  Patient subsequently converted while still in the ED.  Review of Systems: Review of systems as noted in the HPI. All other systems reviewed and are negative.   Past Medical History:  Diagnosis Date   Depression    Diabetes mellitus without complication (HCC)    type 2   GERD (gastroesophageal reflux disease)    HLD (hyperlipidemia)    Hx of adenomatous colonic polyps    Hypertension    Prostate cancer (HCC)    Umbilical hernia with obstruction    Past Surgical History:  Procedure Laterality Date   COLONOSCOPY   06/23/2007   WJX:[id number] polyps removed (cecum and hepatic flexure). The remainder of the colonic mucosa appeared normal/Left-sided diverticula/Normal rectum (tubular adenomas)   COLONOSCOPY N/A 01/05/2014   Procedure: COLONOSCOPY;  Surgeon: Corbin Ade, MD;  Location: [facility];  Service: Endoscopy;  Laterality: N/A;  9:30   ESOPHAGOGASTRODUODENOSCOPY N/A 01/05/2014   Procedure: ESOPHAGOGASTRODUODENOSCOPY (EGD);  Surgeon: Corbin Ade, MD;  Location: [facility];  Service: Endoscopy;  Laterality: N/A;   HARDWARE REMOVAL Right 11/17/2020   Procedure: RIGHT HIP HARDWARE REMOVAL;  Surgeon: Vickki Hearing, MD;  Location: [facility];  Service: Orthopedics;  Laterality: Right;   HIP PINNING,CANNULATED Right 11/09/2019   Procedure: CANNULATED HIP PINNING;  Surgeon: Vickki Hearing, MD;  Location: [facility];  Service: Orthopedics;  Laterality: Right;   KNEE ARTHROSCOPY  2003   right   LUMBAR LAMINECTOMY/DECOMPRESSION MICRODISCECTOMY Right 04/07/2023   Procedure: Microdiscectomy - right - Lumbar four-Lumbar five;  Surgeon: Donalee Citrin, MD;  Location: [facility] OR;  Service: Neurosurgery;  Laterality: Right;   prostate biopsy  2016   RADIOACTIVE SEED IMPLANT N/A 06/02/2015   Procedure: RADIOACTIVE SEED IMPLANT/BRACHYTHERAPY IMPLANT;  Surgeon: Jerilee Field, MD;  Location: [facility];  Service: Urology;  Laterality: N/A;  63 seeds implanted no seeds in bladder   UMBILICAL HERNIA REPAIR N/A 02/12/2017   Procedure: HERNIA REPAIR UMBILICAL ADULT WITH MESH;  Surgeon: Franky Macho, MD;  Location: [facility];  Service: General;  Laterality: N/A;    Social History:  reports that he has never smoked. He has never used smokeless tobacco. He reports that he does not drink alcohol and does not use drugs.   No Known Allergies  Family History  Problem Relation Age of Onset   Diabetes Mother    Heart attack Father    Colon cancer Neg Hx      Prior to Admission medications   Medication Sig  Start Date End Date Taking? Authorizing Provider  amLODipine (NORVASC) 5 MG tablet Take 5 mg by mouth in the morning.    [provider]  buPROPion (WELLBUTRIN XL) 150 MG 24 hr tablet Take 150 mg by mouth in the morning. 03/06/15   [provider]  HYDROcodone-acetaminophen (NORCO/VICODIN) 5-325 MG tablet Take 1-2 tablets by mouth every 4 (four) hours as needed for severe pain ((score 7 to 10)). 04/08/23   Donalee Citrin, MD  ibuprofen (ADVIL) 800 MG tablet Take 1 tablet (800 mg total) by mouth every 8 (eight) hours as needed. Patient not taking: Reported on 03/27/2023 10/17/21   Vickki Hearing, MD  lisinopril (ZESTRIL) 20 MG tablet Take 20 mg by mouth in the morning. 11/12/19   [provider]  metFORMIN (GLUCOPHAGE-XR) 500 MG 24 hr tablet Take 500 mg by mouth daily. 01/27/23   [provider]  omeprazole (PRILOSEC) 20 MG capsule Take 20 mg by mouth daily. 11/30/13   [provider]  pravastatin (PRAVACHOL) 20 MG tablet Take 20 mg by mouth in the morning. 11/12/19   [provider]    Physical Exam: BP 123/82   Pulse 71   Temp 97.6 F (36.4 C) (Oral)   Resp 13   Ht 5\' 6"  (1.676 m)   Wt 76 kg   SpO2 100%   BMI 27.04 kg/m   General: 83 y.o. year-old male well developed well nourished in no acute distress.  Alert and oriented x3. HEENT: NCAT, EOMI Neck: Supple, trachea medial Cardiovascular: Regular rate and rhythm with no rubs or gallops.  No thyromegaly or JVD noted.  No lower extremity edema. 2/4 pulses in all 4 extremities. Respiratory: Clear to auscultation with no wheezes or rales. Good inspiratory effort. Abdomen: Soft, nontender nondistended with normal bowel sounds x4 quadrants. Muskuloskeletal: Noted contusion on right anterior 7 and 8 rib areas.  This was tender to palpation.  No cyanosis, clubbing or edema noted bilaterally Neuro: CN II-XII intact, strength 5/5 x 4, sensation, reflexes intact Skin: No ulcerative lesions noted or  rashes Psychiatry: Judgement and insight appear normal. Mood is appropriate for condition and setting          Labs on Admission:  Basic Metabolic Panel: Recent Labs  Lab 09/17/23 1842  NA 139  K 4.3  CL 107  CO2 23  GLUCOSE 151*  BUN 21  CREATININE 1.45*  CALCIUM 8.8*   Liver Function Tests: No results for input(s): "AST", "ALT", "ALKPHOS", "BILITOT", "PROT", "ALBUMIN" in the last 168 hours. No results for input(s): "LIPASE", "AMYLASE" in the last 168 hours. No results for input(s): "AMMONIA" in the last 168 hours. CBC: Recent Labs  Lab 09/17/23 1842  WBC 17.3*  NEUTROABS 14.4*  HGB 14.5  HCT 46.3  MCV 88.4  PLT 241   Cardiac Enzymes: No results for input(s): "CKTOTAL", "CKMB", "CKMBINDEX", "TROPONINI" in the last 168 hours.  BNP (last 3 results) No results for input(s): "BNP" in the last 8760 hours.  ProBNP (last 3 results) No results for input(s): "PROBNP" in the last 8760 hours.  CBG: No  results for input(s): "GLUCAP" in the last 168 hours.  Radiological Exams on Admission: CT CHEST ABDOMEN PELVIS W CONTRAST Result Date: 09/17/2023 CLINICAL DATA:  Larey Seat, right rib pain, abdominal trauma EXAM: CT CHEST, ABDOMEN, AND PELVIS WITH CONTRAST TECHNIQUE: Multidetector CT imaging of the chest, abdomen and pelvis was performed following the standard protocol during bolus administration of intravenous contrast. RADIATION DOSE REDUCTION: This exam was performed according to the departmental dose-optimization program which includes automated exposure control, adjustment of the mA and/or kV according to patient size and/or use of iterative reconstruction technique. CONTRAST:  80mL OMNIPAQUE IOHEXOL 300 MG/ML  SOLN COMPARISON:  09/17/2023 FINDINGS: CT CHEST FINDINGS Cardiovascular: The heart is unremarkable without pericardial effusion. No evidence of vascular injury. No evidence of thoracic aortic aneurysm or dissection. Atherosclerosis of the aorta and coronary vasculature.  Mediastinum/Nodes: No enlarged mediastinal, hilar, or axillary lymph nodes. Thyroid gland, trachea, and esophagus demonstrate no significant findings. Moderate hiatal hernia. Lungs/Pleura: Scattered ground-glass opacities within the right upper lobe and dependent ground-glass consolidation in the lower lobes are noted, favor hypoventilatory changes. No effusion or pneumothorax. The central airways are patent. Musculoskeletal: There are minimally displaced right anterior seventh and eighth rib fractures. Overlying soft tissue swelling within the right anterolateral lower chest wall. No other acute bony abnormalities. Reconstructed images demonstrate no additional findings. CT ABDOMEN PELVIS FINDINGS Hepatobiliary: No hepatic injury or perihepatic hematoma. Gallbladder is unremarkable. Pancreas: Incidental 1.2 cm cyst within the uncinate process. No pancreatic ductal dilatation or surrounding inflammatory changes. Spleen: No splenic injury or perisplenic hematoma. Adrenals/Urinary Tract: No adrenal hemorrhage or renal injury identified. Bladder is unremarkable. Stomach/Bowel: No bowel obstruction or ileus. Normal appendix right lower quadrant. Distal colonic diverticulosis without evidence of acute diverticulitis. No bowel wall thickening or inflammatory change. Moderate hiatal hernia. Vascular/Lymphatic: 3 cm infrarenal abdominal aortic aneurysm. Moderate atheromatous plaque within the region of the aneurysm, with diffuse atherosclerosis seen elsewhere throughout the aorta and its branches. No pathologic adenopathy. Reproductive: Brachytherapy seeds are seen within the prostate. Other: No free fluid or free intraperitoneal gas. Bilateral fat containing inguinal hernias, left greater than right. No bowel herniation. Musculoskeletal: No acute or destructive bony abnormalities. Postsurgical changes are seen within the right hip from prior hardware placement and removal. Reconstructed images demonstrate no additional  findings. IMPRESSION: 1. Displaced right anterior seventh and eighth rib fractures, with overlying chest wall contusion. 2. No other acute intrathoracic, intra-abdominal, or intrapelvic trauma. 3. Abdominal aortic aneurysm measuring 3 cm. Recommend surveillance ultrasound in 3 years. Reference: Journal of Vascular Surgery 67.1 (2018): 2-77. J Am Coll Radiol 2013;10:789-794. 4. Moderate hiatal hernia. 5. Aortic Atherosclerosis (ICD10-I70.0). Coronary artery atherosclerosis. 6. Incidental 1.2 cm cyst within the uncinate process of the pancreas. Recommend follow up pre and post contrast MRI/MRCP or pancreatic protocol CT in 2 years. This recommendation follows ACR consensus guidelines: Management of Incidental Pancreatic Cysts: A White Paper of the ACR Incidental Findings Committee. J Am Coll Radiol 2017;14:911-923. Electronically Signed   By: Sharlet Salina M.D.   On: 09/17/2023 20:09   DG Chest Portable 1 View Result Date: 09/17/2023 CLINICAL DATA:  Chest wall pain EXAM: PORTABLE CHEST 1 VIEW COMPARISON:  X-ray 04/28/2015 and older FINDINGS: Hiatal hernia. Underinflation. Enlarged cardiopericardial silhouette with vascular congestion. Widened upper mediastinum as well but this could be technical. No pneumothorax, effusion or edema. Film is under penetrated. IMPRESSION: Underinflated and under penetrated x-ray with the prominent mediastinum and vascular congestion. This could be technical but recommend either further workup with a repeat  x-ray with improved inflation or additional cross-sectional imaging study as clinically appropriate to confirm etiology. Hiatal hernia. Electronically Signed   By: Karen Kays M.D.   On: 09/17/2023 18:21    EKG: I independently viewed the EKG done and my findings are as followed: Atrial flutter with 2:1.  AV block, QTc 494 ms.  Assessment/Plan Present on Admission:  Closed rib fracture  Paroxysmal atrial flutter (HCC)  Atrial flutter (HCC)  GERD (gastroesophageal reflux  disease)  Principal Problem:   Closed rib fracture Active Problems:   GERD (gastroesophageal reflux disease)   Paroxysmal atrial flutter (HCC)   Atrial flutter (HCC)   AAA (abdominal aortic aneurysm) (HCC)   Leukocytosis   Elevated troponin   Essential hypertension   Type 2 diabetes mellitus with hyperglycemia (HCC)   Mixed hyperlipidemia  Closed rib fracture CT chest showed displaced right anterior seventh and eighth rib fractures Continue IV Dilaudid 0.5 mg every 3 hours as needed Continue PT/OT eval and treat  Paroxysmal atrial flutter IV Cardizem drip and IV heparin drip was started Patient converted and Cardizem drip was stopped Heparin drip will be discontinued at this time  AAA CT abdomen and pelvis showed abdominal aortic aneurysm measuring 3 cm Surveillance ultrasound in 3 years was recommended by radiologist  Leukocytosis possibly reactive This is possibly due to stress demargination Continue to monitor WBC with morning labs and monitor for any acute infectious process and treat accordingly  Elevated troponin possibly due to type II demand ischemia Troponin 41 > 64; continue to monitor troponin  Essential hypertension (controlled) Continue amlodipine, lisinopril  Type 2 diabetes mellitus with hyperglycemia Last A1c on 03/28/2023 was 6.8 Continue ISS and hypoglycemia protocol Metformin will be held at this time  Mixed hyperlipidemia Continue pravastatin  GERD Continue Protonix  DVT prophylaxis: Lovenox  Code Status: Full code  Family Communication: None at bedside  Consults: None  Severity of Illness: The appropriate patient status for this patient is INPATIENT. Inpatient status is judged to be reasonable and necessary in order to provide the required intensity of service to ensure the patient's safety. The patient's presenting symptoms, physical exam findings, and initial radiographic and laboratory data in the context of their chronic comorbidities  is felt to place them at high risk for further clinical deterioration. Furthermore, it is not anticipated that the patient will be medically stable for discharge from the hospital within 2 midnights of admission.   * I certify that at the point of admission it is my clinical judgment that the patient will require inpatient hospital care spanning beyond 2 midnights from the point of admission due to high intensity of service, high risk for further deterioration and high frequency of surveillance required.*  Author: Frankey Shown, DO 09/18/2023 8:20 AM  For on call review [email or web address].

## 2023-09-17 NOTE — ED Provider Notes (Signed)
[place] [facility]ENC[facility] DEPARTMENT AT [facility] Provider Note   CSN: [id number] Arrival date & time: 09/17/23  1718     History  Chief Complaint  Patient presents with   Alejandro Krueger is a 83 y.o. male.  He has PMH of GERD, prostate cancer, diabetes, hypertension.  Presents ER complaining of fall today with right lower rib and right sided abdominal pain.  He states he tripped and was carrying yesterday, fell into a plastic container outside and the stick also hit his right side of his rib cage.  He states it hurts to breathe   Fall       Home Medications Prior to Admission medications   Medication Sig Start Date End Date Taking? Authorizing Provider  amLODipine (NORVASC) 5 MG tablet Take 5 mg by mouth in the morning.    [provider]  buPROPion (WELLBUTRIN XL) 150 MG 24 hr tablet Take 150 mg by mouth in the morning. 03/06/15   [provider]  HYDROcodone-acetaminophen (NORCO/VICODIN) 5-325 MG tablet Take 1-2 tablets by mouth every 4 (four) hours as needed for severe pain ((score 7 to 10)). 04/08/23   Donalee Citrin, MD  ibuprofen (ADVIL) 800 MG tablet Take 1 tablet (800 mg total) by mouth every 8 (eight) hours as needed. Patient not taking: Reported on 03/27/2023 10/17/21   Vickki Hearing, MD  lisinopril (ZESTRIL) 20 MG tablet Take 20 mg by mouth in the morning. 11/12/19   [provider]  metFORMIN (GLUCOPHAGE-XR) 500 MG 24 hr tablet Take 500 mg by mouth daily. 01/27/23   [provider]  omeprazole (PRILOSEC) 20 MG capsule Take 20 mg by mouth daily. 11/30/13   [provider]  pravastatin (PRAVACHOL) 20 MG tablet Take 20 mg by mouth in the morning. 11/12/19   [provider]      Allergies    Patient has no known allergies.    Review of Systems   Review of Systems  Physical Exam Updated Vital Signs Ht 5\' 6"  (1.676 m)   Wt 76 kg   BMI 27.04 kg/m  Physical Exam Vitals and nursing note reviewed.   Constitutional:      General: He is not in acute distress.    Appearance: He is well-developed.  HENT:     Head: Normocephalic and atraumatic.     Nose: Nose normal.     Mouth/Throat:     Mouth: Mucous membranes are moist.  Eyes:     Extraocular Movements: Extraocular movements intact.     Conjunctiva/sclera: Conjunctivae normal.     Pupils: Pupils are equal, round, and reactive to light.  Cardiovascular:     Rate and Rhythm: Regular rhythm. Tachycardia present.     Heart sounds: No murmur heard. Pulmonary:     Effort: Pulmonary effort is normal. No respiratory distress.     Breath sounds: Normal breath sounds.  Chest:     Comments: Abrasion and tenderness to right lateral chest with no deformity or significant crepitus noted. Abdominal:     Palpations: Abdomen is soft.     Tenderness: There is abdominal tenderness.     Comments: Tenderness RUQ  Musculoskeletal:        General: No swelling.     Cervical back: Neck supple.  Skin:    General: Skin is warm and dry.     Capillary Refill: Capillary refill takes less than 2 seconds.  Neurological:     General: No focal deficit present.  Mental Status: He is alert and oriented to person, place, and time.     Motor: Weakness present.  Psychiatric:        Mood and Affect: Mood normal.     ED Results / Procedures / Treatments   Labs (all labs ordered are listed, but only abnormal results are displayed) Labs Reviewed - No data to display  EKG None  Radiology No results found.  Procedures Procedures    Medications Ordered in ED Medications - No data to display  ED Course/ Medical Decision Making/ A&P Clinical Course as of 09/17/23 1759  Wed Sep 17, 2023  Cyndia Bent on his right side into a plastic container also struck his right side with a stick.  Oxygen saturation is okay with patient having trouble breathing, he does have bilateral lung sounds, will get chest x-ray to rule out large pneumothorax and plan on CT  scan of chest as well as abdomen pelvis due to significant pain in both areas.  I contacted x-ray to expedite the chest film [CB]    Clinical Course User Index [CB] Ma Rings, PA-C                                 Medical Decision Making This patient presents to the ED for concern of fall with right-sided chest pain, this involves an extensive number of treatment options, and is a complaint that carries with it a high risk of complications and morbidity.  The differential diagnosis includes rib fracture, pneumothorax, hemothorax, intra-abdominal injury, other   Co morbidities that complicate the patient evaluation :   GERD, stage I prostate cancer   Additional history obtained:  Additional history obtained from EMR External records from outside source obtained and reviewed including prior notes and labs   Lab Tests:  I Ordered, and personally interpreted labs.  The pertinent results include: Patient has white blood cell count of 17.3, no anemia otherwise reassuring CBC, BMP shows mild increase in creatinine   Imaging Studies ordered:  I ordered imaging studies including chest x-ray which shows no pneumothorax or hemothorax or pulmonary contusion; CT chest abdomen pelvis shows displaced fracture of anterior eighth and ninth rib, no acute intrathoracic or intra-abdominal injuries I independently visualized and interpreted imaging within scope of identifying emergent findings  I agree with the radiologist interpretation-radiology noted patient has disc placed anterior to the left eighth rib fracture with overlying chest wall contusion, incidental finding of abdominal aortic aneurysm measuring 3 cm recommended surveillance ultrasound in 3 years, has had a 1.2 cm cyst within acute process of the pancreas recommend follow-up MRI/MRCP in 2 years   Cardiac Monitoring: / EKG:  The patient was maintained on a cardiac monitor.  I personally viewed and interpreted the cardiac monitored  which showed an underlying rhythm of: sinus tachycardia      Problem List / ED Course / Critical interventions / Medication management  Right rib fractures that are displaced, patient having significant pain, given multiple doses of pain medicine with mild improvement but patient still having difficulty even sitting up in the bed due to his discomfort.  He is agreeable with admission for pain control.  Also noted to be persistently tachycardic, mildly improved with analgesia and IV fluids, no infectious symptoms.  Initial ECG looked irregular but repeat EKG shows irregular rate likely A-fib versus flutter.  Will rate control and put him on heparin.  He had no  intra-abdominal or intrathoracic injuries, no head injury.  No other bleeding. I ordered medication including Percocet for pain Reevaluation of the patient after these medicines showed that the patient improved I have reviewed the patients home medicines and have made adjustments as needed  Pending hospitalization, signed out to Dr. Blinda Leatherwood    Amount and/or Complexity of Data Reviewed Labs: ordered. Radiology: ordered.  Risk OTC drugs. Prescription drug management. Decision regarding hospitalization.           Final Clinical Impression(s) / ED Diagnoses Final diagnoses:  None    Rx / DC Orders ED Discharge Orders     None         Josem Kaufmann 09/17/23 2333    Bethann Berkshire, MD 09/21/23 1237

## 2023-09-17 NOTE — ED Notes (Signed)
RT at bedside.

## 2023-09-17 NOTE — ED Triage Notes (Signed)
Pt fell on the right side of chest on wood pile outside. He c/o right rib pain.

## 2023-09-17 NOTE — Progress Notes (Signed)
PHARMACY - ANTICOAGULATION CONSULT NOTE  Pharmacy Consult for Heparin Indication: atrial fibrillation  No Known Allergies  Patient Measurements: Height: 5\' 6"  (167.6 cm) Weight: 76 kg (167 lb 8.8 oz) IBW/kg (Calculated) : 63.8  Vital Signs: Temp: 97.9 F (36.6 C) (01/22 1954) Temp Source: Oral (01/22 1732) BP: 109/87 (01/22 2330) Pulse Rate: 135 (01/22 2330)  Labs: Recent Labs    09/17/23 1842  HGB 14.5  HCT 46.3  PLT 241  CREATININE 1.45*    Estimated Creatinine Clearance: 35.4 mL/min (A) (by C-G formula based on SCr of 1.45 mg/dL (H)).   Medical History: Past Medical History:  Diagnosis Date   Depression    Diabetes mellitus without complication (HCC)    type 2   GERD (gastroesophageal reflux disease)    HLD (hyperlipidemia)    Hx of adenomatous colonic polyps    Hypertension    Prostate cancer (HCC)    Umbilical hernia with obstruction     Medications:  No current facility-administered medications on file prior to encounter.   Current Outpatient Medications on File Prior to Encounter  Medication Sig Dispense Refill   amLODipine (NORVASC) 5 MG tablet Take 5 mg by mouth in the morning.     buPROPion (WELLBUTRIN XL) 150 MG 24 hr tablet Take 150 mg by mouth in the morning.     HYDROcodone-acetaminophen (NORCO/VICODIN) 5-325 MG tablet Take 1-2 tablets by mouth every 4 (four) hours as needed for severe pain ((score 7 to 10)). 30 tablet 0   ibuprofen (ADVIL) 800 MG tablet Take 1 tablet (800 mg total) by mouth every 8 (eight) hours as needed. (Patient not taking: Reported on 03/27/2023) 90 tablet 1   lisinopril (ZESTRIL) 20 MG tablet Take 20 mg by mouth in the morning.     metFORMIN (GLUCOPHAGE-XR) 500 MG 24 hr tablet Take 500 mg by mouth daily.     omeprazole (PRILOSEC) 20 MG capsule Take 20 mg by mouth daily.     pravastatin (PRAVACHOL) 20 MG tablet Take 20 mg by mouth in the morning.       Assessment: 83 y.o. male with new onset Afib for heparin Goal of  Therapy:  Heparin level 0.3-0.7 units/ml Monitor platelets by anticoagulation protocol: Yes   Plan:  Heparin 4000 units IV bolus, then start heparin 1100 units/hr Check heparin level in 8 hours.   Alejandro Krueger 09/17/2023,11:35 PM

## 2023-09-17 NOTE — ED Notes (Signed)
Patient back from CT. Pain med given.

## 2023-09-18 ENCOUNTER — Telehealth (HOSPITAL_COMMUNITY): Payer: Self-pay | Admitting: Pharmacy Technician

## 2023-09-18 ENCOUNTER — Other Ambulatory Visit (HOSPITAL_COMMUNITY): Payer: Self-pay

## 2023-09-18 DIAGNOSIS — D72825 Bandemia: Secondary | ICD-10-CM

## 2023-09-18 DIAGNOSIS — I10 Essential (primary) hypertension: Secondary | ICD-10-CM | POA: Insufficient documentation

## 2023-09-18 DIAGNOSIS — I4892 Unspecified atrial flutter: Secondary | ICD-10-CM | POA: Diagnosis not present

## 2023-09-18 DIAGNOSIS — E782 Mixed hyperlipidemia: Secondary | ICD-10-CM | POA: Diagnosis not present

## 2023-09-18 DIAGNOSIS — D72829 Elevated white blood cell count, unspecified: Secondary | ICD-10-CM | POA: Insufficient documentation

## 2023-09-18 DIAGNOSIS — S2241XA Multiple fractures of ribs, right side, initial encounter for closed fracture: Secondary | ICD-10-CM | POA: Diagnosis not present

## 2023-09-18 DIAGNOSIS — R7989 Other specified abnormal findings of blood chemistry: Secondary | ICD-10-CM | POA: Insufficient documentation

## 2023-09-18 DIAGNOSIS — E1165 Type 2 diabetes mellitus with hyperglycemia: Secondary | ICD-10-CM | POA: Insufficient documentation

## 2023-09-18 DIAGNOSIS — K219 Gastro-esophageal reflux disease without esophagitis: Secondary | ICD-10-CM | POA: Diagnosis not present

## 2023-09-18 DIAGNOSIS — K862 Cyst of pancreas: Secondary | ICD-10-CM | POA: Insufficient documentation

## 2023-09-18 DIAGNOSIS — I714 Abdominal aortic aneurysm, without rupture, unspecified: Secondary | ICD-10-CM | POA: Insufficient documentation

## 2023-09-18 LAB — CBC
HCT: 44.2 % (ref 39.0–52.0)
Hemoglobin: 13.7 g/dL (ref 13.0–17.0)
MCH: 27.7 pg (ref 26.0–34.0)
MCHC: 31 g/dL (ref 30.0–36.0)
MCV: 89.5 fL (ref 80.0–100.0)
Platelets: 227 10*3/uL (ref 150–400)
RBC: 4.94 MIL/uL (ref 4.22–5.81)
RDW: 21.7 % — ABNORMAL HIGH (ref 11.5–15.5)
WBC: 12.1 10*3/uL — ABNORMAL HIGH (ref 4.0–10.5)
nRBC: 0 % (ref 0.0–0.2)

## 2023-09-18 LAB — BASIC METABOLIC PANEL
Anion gap: 10 (ref 5–15)
BUN: 25 mg/dL — ABNORMAL HIGH (ref 8–23)
CO2: 22 mmol/L (ref 22–32)
Calcium: 8.5 mg/dL — ABNORMAL LOW (ref 8.9–10.3)
Chloride: 106 mmol/L (ref 98–111)
Creatinine, Ser: 1.5 mg/dL — ABNORMAL HIGH (ref 0.61–1.24)
GFR, Estimated: 46 mL/min — ABNORMAL LOW (ref >60)
Glucose, Bld: 126 mg/dL — ABNORMAL HIGH (ref 70–99)
Potassium: 4.4 mmol/L (ref 3.5–5.1)
Sodium: 138 mmol/L (ref 135–145)

## 2023-09-18 LAB — TROPONIN I (HIGH SENSITIVITY)
Troponin I (High Sensitivity): 41 ng/L — ABNORMAL HIGH (ref <18)
Troponin I (High Sensitivity): 64 ng/L — ABNORMAL HIGH (ref <18)
Troponin I (High Sensitivity): 53 ng/L — ABNORMAL HIGH (ref <18)
Troponin I (High Sensitivity): 47 ng/L — ABNORMAL HIGH (ref <18)

## 2023-09-18 LAB — HEPARIN LEVEL (UNFRACTIONATED): Heparin Unfractionated: 0.62 [IU]/mL (ref 0.30–0.70)

## 2023-09-18 LAB — CBG MONITORING, ED: Glucose-Capillary: 145 mg/dL — ABNORMAL HIGH (ref 70–99)

## 2023-09-18 LAB — MAGNESIUM: Magnesium: 2.2 mg/dL (ref 1.7–2.4)

## 2023-09-18 LAB — PHOSPHORUS: Phosphorus: 3.3 mg/dL (ref 2.5–4.6)

## 2023-09-18 MED ORDER — ONDANSETRON HCL 4 MG PO TABS: 4.0000 mg | ORAL_TABLET | Freq: Four times a day (QID) | ORAL | Status: DC | PRN

## 2023-09-18 MED ORDER — ACETAMINOPHEN 500 MG PO TABS: ORAL_TABLET | ORAL | Status: AC

## 2023-09-18 MED ORDER — CHLORHEXIDINE GLUCONATE CLOTH 2 % EX PADS: 6.0000 | MEDICATED_PAD | Freq: Every day | CUTANEOUS | Status: DC

## 2023-09-18 MED ORDER — ACETAMINOPHEN 325 MG PO TABS: 650.0000 mg | ORAL_TABLET | Freq: Four times a day (QID) | ORAL | Status: DC | PRN

## 2023-09-18 MED ORDER — HYDROMORPHONE HCL 1 MG/ML IJ SOLN
0.5000 mg | INTRAMUSCULAR | Status: DC | PRN
  Administered 2023-09-18 (×2): 0.5 mg via INTRAVENOUS
  Filled 2023-09-18 (×2): qty 0.5

## 2023-09-18 MED ORDER — TRAMADOL HCL 50 MG PO TABS: 50.0000 mg | ORAL_TABLET | Freq: Three times a day (TID) | ORAL | 0 refills | Status: AC | PRN

## 2023-09-18 MED ORDER — INSULIN ASPART 100 UNIT/ML IJ SOLN
0.0000 [IU] | Freq: Three times a day (TID) | INTRAMUSCULAR | Status: DC
  Administered 2023-09-18: 1 [IU] via SUBCUTANEOUS
  Filled 2023-09-18: qty 1

## 2023-09-18 MED ORDER — CARVEDILOL 6.25 MG PO TABS: 6.2500 mg | ORAL_TABLET | Freq: Two times a day (BID) | ORAL | 3 refills | Status: AC

## 2023-09-18 MED ORDER — ENOXAPARIN SODIUM 40 MG/0.4ML IJ SOSY
40.0000 mg | PREFILLED_SYRINGE | INTRAMUSCULAR | Status: DC
  Administered 2023-09-18: 40 mg via SUBCUTANEOUS
  Filled 2023-09-18: qty 0.4

## 2023-09-18 MED ORDER — OXYCODONE HCL 5 MG PO TABS: 5.0000 mg | ORAL_TABLET | Freq: Four times a day (QID) | ORAL | 0 refills | Status: AC | PRN

## 2023-09-18 MED ORDER — DILTIAZEM HCL 30 MG PO TABS: 30.0000 mg | ORAL_TABLET | Freq: Four times a day (QID) | ORAL | Status: DC | PRN

## 2023-09-18 MED ORDER — ACETAMINOPHEN 650 MG RE SUPP: 650.0000 mg | Freq: Four times a day (QID) | RECTAL | Status: DC | PRN

## 2023-09-18 MED ORDER — ONDANSETRON HCL 4 MG/2ML IJ SOLN: 4.0000 mg | Freq: Four times a day (QID) | INTRAMUSCULAR | Status: DC | PRN

## 2023-09-18 NOTE — Care Management Obs Status (Signed)
MEDICARE OBSERVATION STATUS NOTIFICATION   Patient Details  Name: Alejandro Krueger MRN: [id number] Date of Birth: 01-08-1941   Medicare Observation Status Notification Given:  Yes    Isabella Bowens, LCSWA 09/18/2023, 11:06 AM

## 2023-09-18 NOTE — Discharge Summary (Signed)
Physician Discharge Summary  Alejandro Krueger ZDG:[id number] DOB: 10-16-1940 DOA: 09/17/2023  PCP: Benita Stabile, MD  Admit date: 09/17/2023 Discharge date: 09/18/2023 Admitted From: Home Disposition: Home Recommendations for Outpatient Follow-up:  Follow up with PCP in 1 to 2 weeks Outpatient follow-up with cardiology as below Check BP, CMP and CBC at follow-up Please follow up on the following pending results: None  Home Health: PT Equipment/Devices: Rolling walker  Discharge Condition: Stable CODE STATUS: Full code  Follow-up Information     Benita Stabile, MD. Schedule an appointment as soon as possible for a visit in 1 week(s).   Specialty: Internal Medicine Contact information: [address] [place] [facility] [address] [phone]         Mallipeddi, Orion Modest, MD Follow up.   Specialties: Cardiology, Internal Medicine Why: The office will mail you a heart monitor to wear for 2 weeks. If you have any issues with placing it, please contact our office. Follow-up has been arranged for 11/13/2023 at 1:40 to review results (will be at the [facility] office location). Contact information: [address] [place] [address] [phone]         [facility] Follow up.   Why: PT will call to start services        [facility] Follow up.   Why: [facility] will call to deliver rolling walker                Hospital course 83 year old M with prior right femoral neck fracture/ORIF in 10/2019, brachial plexus dysfunction, CKD-3A, GERD, prostate cancer, diabetes and hypertension presented to ED with right chest wall pain after he had accidental fall tripping on a plastic container outside his house.  He was holding to a wound that he had his right chest when he fell.  He denies prodromes leading to fall.  He denies palpitation or shortness of breath.  Chest x-ray was not diagnostic due to poor penetration.  CT chest, abdomen and pelvis ordered and showed displaced right  anterior seventh and eighth rib fracture with overlying chest wall contusion, abdominal aortic aneurysm measuring 3 cm, moderate hiatal hernia, aortic atherosclerosis and incidental 1.2 cm cyst with send the uncinate process of the pancreas.  WBC 17.3 with left shift.  Troponin 41>> 64.  Initial EKG revealed SVT.  Subsequent EKG suggests A-fib/flutter with RVR.  Patient was started on Cardizem drip and quickly converted to sinus bradycardia.  Cardizem discontinued.    The next day, remained in sinus rhythm throughout the day without medications.  Pain improved.  He is discharged on Tylenol, tramadol and oxycodone for pain control.  Encouraged to work on deep breathing to avoid pulmonary complications such as atelectasis.    In regards to A-fib/a flutter, he is discharged on p.o. Coreg 6.25 mg twice daily for hypertension and A-fib/RVR.  Discontinue lisinopril and amlodipine.  Although his CHA2DS2-VASc score is 3, we decided to defer anticoagulation at this time.  I suspect A-fib/flutter is provoked in the setting of chest wall trauma and pain.  There is also risk of hemothorax in the setting of rib fracture.  Cardiology to arrange Zio patch and outpatient follow-up.   See individual problem list below for more.   Problems addressed during this hospitalization Accidental fall at home Close rib fracture: CT showed displaced right seventh and eighth rib fracture anteriorly. -Scheduled Tylenol, as needed tramadol and oxycodone.  Narcotic database reviewed.  Risk and benefit discussed. -Outpatient follow-up with PCP  New onset paroxysmal A-fib/flutter: Likely due to the above.  Now in sinus rhythm of Cardizem. -Discharged on Coreg -Deferred anticoag due to the above. -Cardiology to arrange Zio patch and outpatient follow-up.  Essential hypertension -Started Coreg in the setting of A-fib/flutter.  Discontinued amlodipine and lisinopril.  AAA: CT showed 3 cm abdominal AAA. -Surveillance in 3 years  per recommendation  Pancreatic cyst: CT abdomen and pelvis showed incidental 1.2 cm uncinate process cyst -Repeat CT in 2 years  Leukocytosis/bandemia: Likely demargination.  Other chronic comorbidities stable.            Time spent 35 minutes  Vital signs Vitals:   09/18/23 0700 09/18/23 0715 09/18/23 0722 09/18/23 1117  BP: 125/74  123/82 138/80  Pulse: 64 65 71 76  Temp:   97.6 F (36.4 C) 97.6 F (36.4 C)  Resp: 18 16 13 17   Height:      Weight:      SpO2: 100% 100% 100% 90%  TempSrc:   Oral Oral  BMI (Calculated):         Discharge exam  GENERAL: No apparent distress.  Nontoxic. HEENT: MMM.  Vision and hearing grossly intact.  NECK: Supple.  No apparent JVD.  RESP:  No IWOB.  Fair aeration bilaterally. CVS:  RRR. Heart sounds normal.  ABD/GI/GU: BS+. Abd soft, NTND.  MSK/EXT:  Moves extremities.  Tenderness, bruise contusion over right anterior chest. SKIN: As above. NEURO: Awake and alert. Oriented appropriately.  No apparent focal neuro deficit. PSYCH: Calm. Normal affect.   Discharge Instructions Discharge Instructions     Diet - low sodium heart healthy   Complete by: As directed    Discharge instructions   Complete by: As directed    It has been a pleasure taking care of you!  You were hospitalized due to rib fracture after fall.  Discharged and given pain medication to help with pain.  It is very important that you exercise deep breathing with incentive spirometry.  Note that pain medication such as tramadol and oxycodone can make you sleepy and confused.  They can also increase your risk of fall.  Take these medications only as needed for significant pain.  We do not recommend operating machinery, driving or performing complex tasks while taking these medications.  You also had brief atrial fibrillation/flutter (abnormally fast and irregular heartbeat) that has resolved.  We have changed your blood pressure medications to Coreg which could also  regulate your heart.  Cardiology will arrange outpatient follow-up and heart monitor.   Follow-up with your primary care doctor in 1 to 2 weeks or sooner if needed.   Take care,   Increase activity slowly   Complete by: As directed       Allergies as of 09/18/2023   No Known Allergies      Medication List     STOP taking these medications    amLODipine 5 MG tablet Commonly known as: NORVASC   HYDROcodone-acetaminophen 5-325 MG tablet Commonly known as: NORCO/VICODIN   ibuprofen 800 MG tablet Commonly known as: ADVIL   lisinopril 20 MG tablet Commonly known as: ZESTRIL       TAKE these medications    acetaminophen 500 MG tablet Commonly known as: TYLENOL Take 2 tablets (1,000 mg total) by mouth every 8 (eight) hours for 5 days, THEN 2 tablets (1,000 mg total) every 8 (eight) hours as needed for up to 5 days. Start taking on: September 18, 2023   buPROPion 150 MG 24 hr  tablet Commonly known as: WELLBUTRIN XL Take 150 mg by mouth in the morning.   carvedilol 6.25 MG tablet Commonly known as: Coreg Take 1 tablet (6.25 mg total) by mouth 2 (two) times daily.   metFORMIN 500 MG 24 hr tablet Commonly known as: GLUCOPHAGE-XR Take 500 mg by mouth daily.   omeprazole 20 MG capsule Commonly known as: PRILOSEC Take 20 mg by mouth daily.   oxyCODONE 5 MG immediate release tablet Commonly known as: Roxicodone Take 1 tablet (5 mg total) by mouth every 6 (six) hours as needed for up to 5 days for severe pain (pain score 7-10).   pravastatin 20 MG tablet Commonly known as: PRAVACHOL Take 20 mg by mouth in the morning.   traMADol 50 MG tablet Commonly known as: Ultram Take 1 tablet (50 mg total) by mouth every 8 (eight) hours as needed for up to 5 days for moderate pain (pain score 4-6).               Durable Medical Equipment  (From admission, onward)           Start     Ordered   09/18/23 1122  For home use only DME Walker rolling  Once        Question Answer Comment  Walker: With 5 Inch Wheels   Patient needs a walker to treat with the following condition Fall      09/18/23 1121            Consultations: None  Procedures/Studies:   CT CHEST ABDOMEN PELVIS W CONTRAST Result Date: 09/17/2023 CLINICAL DATA:  Larey Seat, right rib pain, abdominal trauma EXAM: CT CHEST, ABDOMEN, AND PELVIS WITH CONTRAST TECHNIQUE: Multidetector CT imaging of the chest, abdomen and pelvis was performed following the standard protocol during bolus administration of intravenous contrast. RADIATION DOSE REDUCTION: This exam was performed according to the departmental dose-optimization program which includes automated exposure control, adjustment of the mA and/or kV according to patient size and/or use of iterative reconstruction technique. CONTRAST:  80mL OMNIPAQUE IOHEXOL 300 MG/ML  SOLN COMPARISON:  09/17/2023 FINDINGS: CT CHEST FINDINGS Cardiovascular: The heart is unremarkable without pericardial effusion. No evidence of vascular injury. No evidence of thoracic aortic aneurysm or dissection. Atherosclerosis of the aorta and coronary vasculature. Mediastinum/Nodes: No enlarged mediastinal, hilar, or axillary lymph nodes. Thyroid gland, trachea, and esophagus demonstrate no significant findings. Moderate hiatal hernia. Lungs/Pleura: Scattered ground-glass opacities within the right upper lobe and dependent ground-glass consolidation in the lower lobes are noted, favor hypoventilatory changes. No effusion or pneumothorax. The central airways are patent. Musculoskeletal: There are minimally displaced right anterior seventh and eighth rib fractures. Overlying soft tissue swelling within the right anterolateral lower chest wall. No other acute bony abnormalities. Reconstructed images demonstrate no additional findings. CT ABDOMEN PELVIS FINDINGS Hepatobiliary: No hepatic injury or perihepatic hematoma. Gallbladder is unremarkable. Pancreas: Incidental 1.2 cm cyst  within the uncinate process. No pancreatic ductal dilatation or surrounding inflammatory changes. Spleen: No splenic injury or perisplenic hematoma. Adrenals/Urinary Tract: No adrenal hemorrhage or renal injury identified. Bladder is unremarkable. Stomach/Bowel: No bowel obstruction or ileus. Normal appendix right lower quadrant. Distal colonic diverticulosis without evidence of acute diverticulitis. No bowel wall thickening or inflammatory change. Moderate hiatal hernia. Vascular/Lymphatic: 3 cm infrarenal abdominal aortic aneurysm. Moderate atheromatous plaque within the region of the aneurysm, with diffuse atherosclerosis seen elsewhere throughout the aorta and its branches. No pathologic adenopathy. Reproductive: Brachytherapy seeds are seen within the prostate. Other: No free fluid or free  intraperitoneal gas. Bilateral fat containing inguinal hernias, left greater than right. No bowel herniation. Musculoskeletal: No acute or destructive bony abnormalities. Postsurgical changes are seen within the right hip from prior hardware placement and removal. Reconstructed images demonstrate no additional findings. IMPRESSION: 1. Displaced right anterior seventh and eighth rib fractures, with overlying chest wall contusion. 2. No other acute intrathoracic, intra-abdominal, or intrapelvic trauma. 3. Abdominal aortic aneurysm measuring 3 cm. Recommend surveillance ultrasound in 3 years. Reference: Journal of Vascular Surgery 67.1 (2018): 2-77. J Am Coll Radiol 2013;10:789-794. 4. Moderate hiatal hernia. 5. Aortic Atherosclerosis (ICD10-I70.0). Coronary artery atherosclerosis. 6. Incidental 1.2 cm cyst within the uncinate process of the pancreas. Recommend follow up pre and post contrast MRI/MRCP or pancreatic protocol CT in 2 years. This recommendation follows ACR consensus guidelines: Management of Incidental Pancreatic Cysts: A White Paper of the ACR Incidental Findings Committee. J Am Coll Radiol 2017;14:911-923.  Electronically Signed   By: Sharlet Salina M.D.   On: 09/17/2023 20:09   DG Chest Portable 1 View Result Date: 09/17/2023 CLINICAL DATA:  Chest wall pain EXAM: PORTABLE CHEST 1 VIEW COMPARISON:  X-ray 04/28/2015 and older FINDINGS: Hiatal hernia. Underinflation. Enlarged cardiopericardial silhouette with vascular congestion. Widened upper mediastinum as well but this could be technical. No pneumothorax, effusion or edema. Film is under penetrated. IMPRESSION: Underinflated and under penetrated x-ray with the prominent mediastinum and vascular congestion. This could be technical but recommend either further workup with a repeat x-ray with improved inflation or additional cross-sectional imaging study as clinically appropriate to confirm etiology. Hiatal hernia. Electronically Signed   By: Karen Kays M.D.   On: 09/17/2023 18:21   OCT, Retina - OU - Both Eyes Result Date: 09/11/2023 Right Eye Quality was good. Central Foveal Thickness: 321. Progression has improved. Findings include normal foveal contour, no SRF, retinal drusen , subretinal hyper-reflective material, intraretinal fluid, outer retinal atrophy, vitreomacular adhesion (Interval improvement in peripapillary IRF nasal mac, central vitelliform like lesion w/ stable improvement in surrounding ellipsoid signal / ORA, mild drusen). Left Eye Quality was good. Central Foveal Thickness: 325. Progression has been stable. Findings include normal foveal contour, no IRF, no SRF (Partial PVD, trace peripapillary cystic changes). Notes *Images captured and stored on drive Diagnosis / Impression: OD: Interval improvement in peripapillary IRF nasal mac, central vitelliform like lesion w/ stable improvement in surrounding ellipsoid signal / ORA, mild drusen OS: NFP; no IRF/SRF; partial PVD, trace peripapillary cystic changes Clinical management: See below Abbreviations: NFP - Normal foveal profile. CME - cystoid macular edema. PED - pigment epithelial detachment.  IRF - intraretinal fluid. SRF - subretinal fluid. EZ - ellipsoid zone. ERM - epiretinal membrane. ORA - outer retinal atrophy. ORT - outer retinal tubulation. SRHM - subretinal hyper-reflective material. IRHM - intraretinal hyper-reflective material       The results of significant diagnostics from this hospitalization (including imaging, microbiology, ancillary and laboratory) are listed below for reference.     Microbiology: No results found for this or any previous visit (from the past 240 hours).   Labs:  CBC: Recent Labs  Lab 09/17/23 1842 09/18/23 0811  WBC 17.3* 12.1*  NEUTROABS 14.4*  --   HGB 14.5 13.7  HCT 46.3 44.2  MCV 88.4 89.5  PLT 241 227   BMP &GFR Recent Labs  Lab 09/17/23 1842 09/18/23 0830  NA 139 138  K 4.3 4.4  CL 107 106  CO2 23 22  GLUCOSE 151* 126*  BUN 21 25*  CREATININE 1.45* 1.50*  CALCIUM  8.8* 8.5*  MG  --  2.2  PHOS  --  3.3   Estimated Creatinine Clearance: 34.3 mL/min (A) (by C-G formula based on SCr of 1.5 mg/dL (H)). Liver & Pancreas: No results for input(s): "AST", "ALT", "ALKPHOS", "BILITOT", "PROT", "ALBUMIN" in the last 168 hours. No results for input(s): "LIPASE", "AMYLASE" in the last 168 hours. No results for input(s): "AMMONIA" in the last 168 hours. Diabetic: No results for input(s): "HGBA1C" in the last 72 hours. Recent Labs  Lab 09/18/23 1001  GLUCAP 145*   Cardiac Enzymes: No results for input(s): "CKTOTAL", "CKMB", "CKMBINDEX", "TROPONINI" in the last 168 hours. No results for input(s): "PROBNP" in the last 8760 hours. Coagulation Profile: No results for input(s): "INR", "PROTIME" in the last 168 hours. Thyroid Function Tests: No results for input(s): "TSH", "T4TOTAL", "FREET4", "T3FREE", "THYROIDAB" in the last 72 hours. Lipid Profile: No results for input(s): "CHOL", "HDL", "LDLCALC", "TRIG", "CHOLHDL", "LDLDIRECT" in the last 72 hours. Anemia Panel: No results for input(s): "VITAMINB12", "FOLATE",  "FERRITIN", "TIBC", "IRON", "RETICCTPCT" in the last 72 hours. Urine analysis: No results found for: "COLORURINE", "APPEARANCEUR", "LABSPEC", "PHURINE", "GLUCOSEU", "HGBUR", "BILIRUBINUR", "KETONESUR", "PROTEINUR", "UROBILINOGEN", "NITRITE", "LEUKOCYTESUR" Sepsis Labs: Invalid input(s): "PROCALCITONIN", "LACTICIDVEN"   SIGNED:  Almon Hercules, MD  [facility] 09/18/2023, 3:02 PM

## 2023-09-18 NOTE — Care Management CC44 (Signed)
Condition Code 44 Documentation Completed  Patient Details  Name: Alejandro Krueger MRN: [id number] Date of Birth: 12-27-1940   Condition Code 44 given:  Yes Patient signature on Condition Code 44 notice:  Yes (Patient asked for his spouse to sign and began vomiting) Documentation of 2 MD's agreement:  Yes Code 44 added to claim:  Yes    Isabella Bowens, LCSWA 09/18/2023, 11:06 AM

## 2023-09-18 NOTE — Evaluation (Signed)
Physical Therapy Evaluation Patient Details Name: Alejandro Krueger MRN: [id number] DOB: 23-Jun-1941 Today's Date: 09/18/2023  History of Present Illness  Alejandro Krueger is a 83 y.o. male with medical history significant of hypertension, type 2 diabetes mellitus, hyperlipidemia, GERD who presents to the emergency department after tripping while carrying a wood, he said that he fell into a plastic container outside and the wood he was carrying also hit right side of his rib cage.  He complained of worsening pain on taking deep breath. (per DO)   Clinical Impression  Patient was agreeable to therapy. Patient required [facility] to be elevated and some assist for bed mobility. Ambulation was a little unsteady but supervision/CTG were provided. Recommended a walker to help with balance during ambulation. Patient was limited by pain to ambulate more than 10 feet. Patient was left in chair with call ball and stated sitting up helped with the pain.  PLAN:  Patient to be discharged home today and discharged from acute physical therapy with recommendations stated below.          If plan is discharge home, recommend the following: A little help with walking and/or transfers;A little help with bathing/dressing/bathroom;Assistance with cooking/housework;Help with stairs or ramp for entrance   Can travel by private vehicle        Equipment Recommendations Rolling walker (2 wheels)  Recommendations for Other Services       Functional Status Assessment Patient has had a recent decline in their functional status and demonstrates the ability to make significant improvements in function in a reasonable and predictable amount of time.     Precautions / Restrictions Precautions Precautions: Fall Restrictions Weight Bearing Restrictions Per Provider Order: No      Mobility  Bed Mobility Overal bed mobility: Needs Assistance Bed Mobility: Supine to Sit     Supine to sit: Supervision, HOB elevated      General bed mobility comments: use of rail; labored effort    Transfers Overall transfer level: Needs assistance   Transfers: Bed to chair/wheelchair/BSC, Sit to/from Stand Sit to Stand: Contact guard assist   Step pivot transfers: Contact guard assist       General transfer comment: Somewhat unsteady with slow labored movement    Ambulation/Gait Ambulation/Gait assistance: Contact guard assist, Min assist Gait Distance (Feet): 10 Feet   Gait Pattern/deviations: Decreased step length - right, Decreased step length - left, Decreased stride length Gait velocity: slow     General Gait Details: slow due to pain in R flank  Stairs            Wheelchair Mobility     Tilt Bed    Modified Rankin (Stroke Patients Only)       Balance Overall balance assessment: Needs assistance Sitting-balance support: Feet supported, No upper extremity supported Sitting balance-Leahy Scale: Good Sitting balance - Comments: seated at EOB   Standing balance support: No upper extremity supported, During functional activity Standing balance-Leahy Scale: Fair Standing balance comment: without AD                             Pertinent Vitals/Pain Pain Assessment Pain Assessment: 0-10 Pain Score: 4  Pain Location: R flank Pain Descriptors / Indicators: Constant Pain Intervention(s): Limited activity within patient's tolerance, Monitored during session, Repositioned    Home Living Family/patient expects to be discharged to:: Private residence Living Arrangements: Spouse/significant other Available Help at Discharge: Family;Available 24 hours/day Type of Home: House  Home Access: Stairs to enter Entrance Stairs-Rails: Right;Left;Can reach both Entrance Stairs-Number of Steps: 3   Home Layout: One level Home Equipment: None      Prior Function Prior Level of Function : Independent/Modified Independent             Mobility Comments: Tourist information centre manager without  AD ADLs Comments: Independent     Extremity/Trunk Assessment   Upper Extremity Assessment Upper Extremity Assessment: Defer to OT evaluation    Lower Extremity Assessment Lower Extremity Assessment: Generalized weakness    Cervical / Trunk Assessment Cervical / Trunk Assessment: Normal  Communication   Communication Communication: No apparent difficulties Cueing Techniques: Verbal cues;Tactile cues  Cognition Arousal: Alert Behavior During Therapy: WFL for tasks assessed/performed Overall Cognitive Status: Within Functional Limits for tasks assessed                                          General Comments      Exercises     Assessment/Plan    PT Assessment All further PT needs can be met in the next venue of care  PT Problem List Decreased strength;Decreased activity tolerance;Decreased balance;Decreased mobility;Decreased coordination       PT Treatment Interventions      PT Goals (Current goals can be found in the Care Plan section)  Acute Rehab PT Goals Patient Stated Goal: return home PT Goal Formulation: With patient Time For Goal Achievement: 09/18/23 Potential to Achieve Goals: Good    Frequency       Co-evaluation PT/OT/SLP Co-Evaluation/Treatment: Yes Reason for Co-Treatment: To address functional/ADL transfers PT goals addressed during session: Mobility/safety with mobility OT goals addressed during session: ADL's and self-care       AM-PAC PT "6 Clicks" Mobility  Outcome Measure Help needed turning from your back to your side while in a flat bed without using bedrails?: A Lot Help needed moving from lying on your back to sitting on the side of a flat bed without using bedrails?: A Lot Help needed moving to and from a bed to a chair (including a wheelchair)?: A Little Help needed standing up from a chair using your arms (e.g., wheelchair or bedside chair)?: A Little Help needed to walk in hospital room?: A Little Help  needed climbing 3-5 steps with a railing? : A Little 6 Click Score: 16    End of Session   Activity Tolerance: Patient tolerated treatment well;Patient limited by pain Patient left: in chair;with call bell/phone within reach Nurse Communication: Mobility status PT Visit Diagnosis: Unsteadiness on feet (R26.81);Other abnormalities of gait and mobility (R26.89);Muscle weakness (generalized) (M62.81)    Time: 9562-1308 PT Time Calculation (min) (ACUTE ONLY): 12 min   Charges:   PT Evaluation $PT Eval Low Complexity: 1 Low PT Treatments $Therapeutic Activity: 8-22 mins PT General Charges $$ ACUTE PT VISIT: 1 Visit         Corissa Oguinn SPT

## 2023-09-18 NOTE — ED Notes (Signed)
Stop cardizem per edp pollina

## 2023-09-18 NOTE — ED Notes (Signed)
There are beds available at [facility] per [facility]. Pt agreeable to go .

## 2023-09-18 NOTE — Progress Notes (Addendum)
PHARMACY - ANTICOAGULATION CONSULT NOTE  Pharmacy Consult for Heparin Indication: atrial fibrillation  No Known Allergies  Patient Measurements: Height: 5\' 6"  (167.6 cm) Weight: 76 kg (167 lb 8.8 oz) IBW/kg (Calculated) : 63.8  Vital Signs: Temp: 97.6 F (36.4 C) (01/23 0722) Temp Source: Oral (01/23 0722) BP: 123/82 (01/23 0722) Pulse Rate: 71 (01/23 0722)  Labs: Recent Labs    09/17/23 1842 09/18/23 0122  HGB 14.5  --   HCT 46.3  --   PLT 241  --   CREATININE 1.45*  --   TROPONINIHS 41* 64*    Estimated Creatinine Clearance: 35.4 mL/min (A) (by C-G formula based on SCr of 1.45 mg/dL (H)).   Medical History: Past Medical History:  Diagnosis Date   Depression    Diabetes mellitus without complication (HCC)    type 2   GERD (gastroesophageal reflux disease)    HLD (hyperlipidemia)    Hx of adenomatous colonic polyps    Hypertension    Prostate cancer (HCC)    Umbilical hernia with obstruction     Medications:  No current facility-administered medications on file prior to encounter.   Current Outpatient Medications on File Prior to Encounter  Medication Sig Dispense Refill   amLODipine (NORVASC) 5 MG tablet Take 5 mg by mouth in the morning.     buPROPion (WELLBUTRIN XL) 150 MG 24 hr tablet Take 150 mg by mouth in the morning.     HYDROcodone-acetaminophen (NORCO/VICODIN) 5-325 MG tablet Take 1-2 tablets by mouth every 4 (four) hours as needed for severe pain ((score 7 to 10)). 30 tablet 0   ibuprofen (ADVIL) 800 MG tablet Take 1 tablet (800 mg total) by mouth every 8 (eight) hours as needed. (Patient not taking: Reported on 03/27/2023) 90 tablet 1   lisinopril (ZESTRIL) 20 MG tablet Take 20 mg by mouth in the morning.     metFORMIN (GLUCOPHAGE-XR) 500 MG 24 hr tablet Take 500 mg by mouth daily.     omeprazole (PRILOSEC) 20 MG capsule Take 20 mg by mouth daily.     pravastatin (PRAVACHOL) 20 MG tablet Take 20 mg by mouth in the morning.       Assessment: 83  y.o. male with new onset Afib started on IV heparin. Initial heparin level is at goal (0.62). CBC appears normal, no bleeding issues noted overnight.   Addendum: patient converted to NSR, heparin infusion was then stopped.   Goal of Therapy:  Heparin level 0.3-0.7 units/ml Monitor platelets by anticoagulation protocol: Yes   Plan:  Heparin discontinued, will sign off and follow along for any other pharmacy needs  Sheppard Coil PharmD., BCPS Clinical Pharmacist 09/18/2023 7:58 AM

## 2023-09-18 NOTE — Plan of Care (Signed)
  Problem: Acute Rehab OT Goals (only OT should resolve) Goal: Pt. Will Perform Grooming Flowsheets (Taken 09/18/2023 0949) Pt Will Perform Grooming: with modified independence Goal: Pt. Will Perform Lower Body Dressing Flowsheets (Taken 09/18/2023 0949) Pt Will Perform Lower Body Dressing: with modified independence Goal: Pt. Will Transfer To Toilet Flowsheets (Taken 09/18/2023 [phone]) Pt Will Transfer to Toilet: with modified independence Goal: Pt/Caregiver Will Perform Home Exercise Program Flowsheets (Taken 09/18/2023 [phone]) Pt/caregiver will Perform Home Exercise Program:  Increased strength  Both right and left upper extremity  Independently  Chyann Ambrocio OT, MOT

## 2023-09-18 NOTE — Evaluation (Signed)
Occupational Therapy Evaluation Patient Details Name: Alejandro Krueger MRN: [id number] DOB: 05-01-1941 Today's Date: 09/18/2023   History of Present Illness Alejandro Krueger is a 83 y.o. male with medical history significant of hypertension, type 2 diabetes mellitus, hyperlipidemia, GERD who presents to the emergency department after tripping while carrying a wood, he said that he fell into a plastic container outside and the wood he was carrying also hit right side of his rib cage.  He complained of worsening pain on taking deep breath. (per DO)   Clinical Impression   Pt agreeable to OT and PT co-evaluation. Pt is independent at baseline. Limited by pain and fatigue today with pt ambulating just outside the room with slow labored movement. Pt reports his wife and help with with lower body ADL tasks since pt could not doff and don socks today. Pt somewhat unsteady but feels safe going home. Pt left in the chair with call bell within reach. Pt will benefit from continued OT in the hospital and recommended venue below to increase strength, balance, and endurance for safe ADL's.          If plan is discharge home, recommend the following: A little help with walking and/or transfers;A lot of help with bathing/dressing/bathroom;Assistance with cooking/housework;Assist for transportation;Help with stairs or ramp for entrance    Functional Status Assessment  Patient has had a recent decline in their functional status and demonstrates the ability to make significant improvements in function in a reasonable and predictable amount of time.  Equipment Recommendations  None recommended by OT    Recommendations for Other Services       Precautions / Restrictions Precautions Precautions: Fall Restrictions Weight Bearing Restrictions Per Provider Order: No      Mobility Bed Mobility Overal bed mobility: Needs Assistance Bed Mobility: Supine to Sit     Supine to sit: Supervision, HOB elevated      General bed mobility comments: use of rail; labored effort    Transfers Overall transfer level: Needs assistance   Transfers: Bed to chair/wheelchair/BSC, Sit to/from Stand Sit to Stand: Contact guard assist     Step pivot transfers: Contact guard assist     General transfer comment: Somewhat unsteady with slow labored movement      Balance Overall balance assessment: Needs assistance Sitting-balance support: Feet supported, No upper extremity supported Sitting balance-Leahy Scale: Good Sitting balance - Comments: seated at EOB   Standing balance support: No upper extremity supported, During functional activity Standing balance-Leahy Scale: Fair Standing balance comment: without AD                           ADL either performed or assessed with clinical judgement   ADL Overall ADL's : Needs assistance/impaired     Grooming: Set up;Sitting       Lower Body Bathing: Moderate assistance;Maximal assistance;Sitting/lateral leans       Lower Body Dressing: Moderate assistance;Maximal assistance;Sitting/lateral leans   Toilet Transfer: Contact guard assist;Stand-pivot;Ambulation Toilet Transfer Details (indicate cue type and reason): Simulated via EOB to chair transfer Toileting- Clothing Manipulation and Hygiene: Minimal assistance;Moderate assistance;Sitting/lateral lean       Functional mobility during ADLs: Contact guard assist General ADL Comments: Able to ambulate ~10 feet     Vision Baseline Vision/History: 1 Wears glasses Ability to See in Adequate Light: 1 Impaired Patient Visual Report: No change from baseline Vision Assessment?: No apparent visual deficits     Perception Perception: Not tested  Praxis Praxis: Not tested       Pertinent Vitals/Pain Pain Assessment Pain Assessment: 0-10 Pain Score: 4  Pain Location: R flank Pain Descriptors / Indicators: Constant Pain Intervention(s): Limited activity within patient's  tolerance, Monitored during session, Repositioned     Extremity/Trunk Assessment Upper Extremity Assessment Upper Extremity Assessment: Left hand dominant (mild limitation from rib fx pain; near full A/ROM for shoulder flexion)   Lower Extremity Assessment Lower Extremity Assessment: Defer to PT evaluation   Cervical / Trunk Assessment Cervical / Trunk Assessment: Normal   Communication Communication Communication: No apparent difficulties   Cognition Arousal: Alert Behavior During Therapy: WFL for tasks assessed/performed Overall Cognitive Status: Within Functional Limits for tasks assessed                                                        Home Living Family/patient expects to be discharged to:: Private residence Living Arrangements: Spouse/significant other Available Help at Discharge: Family;Available 24 hours/day Type of Home: House Home Access: Stairs to enter [employer] of Steps: 3 Entrance Stairs-Rails: Right;Left;Can reach both Home Layout: One level     Bathroom Shower/Tub: Producer, television/film/video: Handicapped height Bathroom Accessibility: Yes How Accessible: Accessible via wheelchair;Accessible via walker Home Equipment: None          Prior Functioning/Environment Prior Level of Function : Independent/Modified Independent             Mobility Comments: Tourist information centre manager without AD ADLs Comments: Independent        OT Problem List: Decreased strength;Decreased activity tolerance;Impaired balance (sitting and/or standing)      OT Treatment/Interventions: Self-care/ADL training;Therapeutic exercise;Therapeutic activities;Patient/family education    OT Goals(Current goals can be found in the care plan section) Acute Rehab OT Goals Patient Stated Goal: return home OT Goal Formulation: With patient Time For Goal Achievement: 10/02/23 Potential to Achieve Goals: Good  OT Frequency: Min 1X/week     Co-evaluation PT/OT/SLP Co-Evaluation/Treatment: Yes Reason for Co-Treatment: To address functional/ADL transfers   OT goals addressed during session: ADL's and self-care                       End of Session    Activity Tolerance: Patient tolerated treatment well Patient left: in chair;with call bell/phone within reach  OT Visit Diagnosis: Unsteadiness on feet (R26.81);Other abnormalities of gait and mobility (R26.89);Muscle weakness (generalized) (M62.81);History of falling (Z91.81)                Time: 4540-9811 OT Time Calculation (min): 11 min Charges:  OT General Charges $OT Visit: 1 Visit OT Evaluation $OT Eval Low Complexity: 1 Low  Langdon Crosson OT, MOT  Danie Chandler 09/18/2023, 9:47 AM

## 2023-09-18 NOTE — Telephone Encounter (Signed)
Patient Product/process development scientist completed.    The patient is insured through [facility]. Patient has Medicare and is not eligible for a copay card, but may be able to apply for patient assistance or Medicare RX Payment Plan (Patient Must reach out to their plan, if eligible for payment plan), if available.    Ran test claim for Eliquis 5 mg and the current 30 day co-pay is $47.00.   This test claim was processed through [facility]- copay amounts may vary at other pharmacies due to pharmacy/plan contracts, or as the patient moves through the different stages of their insurance plan.     Roland Earl, CPHT Pharmacy Technician III Certified Patient Advocate [facility] Pharmacy Patient Advocate Team Direct Number: [phone]  Fax: [phone]

## 2023-09-18 NOTE — TOC Transition Note (Addendum)
Transition of Care [facility]) - Discharge Note   Patient Details  Name: Alejandro Krueger MRN: [id number] Date of Birth: [birth year]  [facility] of [facility]) CM/SW Contact:  Isabella Bowens, LCSWA Phone Number: 09/18/2023, 11:22 AM   Clinical Narrative:     CSW spoke with patient and spouse at bedside. CSW had to issue a Code 44 and afterwards was made aware that PT recommended HHPT and a RW. Patient and spouse agreeable to both when speaking with them. Both had no preference , just made sure that patient insurance would cover HHPT and rolling walker. Patient and spouse agreeable to [facility]  providing HHPT and [employer] to provide the [employer]. Kandee Keen  and [place] made aware and able to assist. TOC signing off.   Final next level of care: Home w Home Health Services Barriers to Discharge: Barriers Resolved   Patient Goals and CMS Choice Patient states their goals for this hospitalization and ongoing recovery are:: DC home CMS Medicare.gov Compare Post Acute Care list provided to:: Patient Choice offered to / list presented to : Patient      Discharge Placement     Return home with spouse              Name of family member notified: Patient and Spouse Patient and family notified of of transfer: 09/18/23  Discharge Plan and Services Additional resources added to the After Visit Summary for   In-house Referral: Clinical Social Work   Post Acute Care Choice: Durable Medical Equipment          DME Arranged: Dan Humphreys rolling DME Agency: [employer] Date DME Agency Contacted: 09/18/23 Time DME Agency Contacted: 1119 Representative spoke with at DME Agency: Ian Malkin HH Arranged: PT HH Agency: [facility] Health Care Date [facility] Agency Contacted: 09/18/23 Time HH Agency Contacted: 1119 Representative spoke with at [facility] Agency: Kandee Keen  Social Drivers of Health (SDOH) Interventions SDOH Screenings   Tobacco Use: Low Risk  (09/17/2023)     Readmission Risk Interventions    09/18/2023   11:18 AM  09/18/2023   11:09 AM  Readmission Risk Prevention Plan  Medication Screening Complete Complete  Transportation Screening Complete Complete

## 2023-09-19 ENCOUNTER — Telehealth: Payer: Self-pay | Admitting: *Deleted

## 2023-09-19 ENCOUNTER — Ambulatory Visit: Payer: Medicare Other | Attending: Internal Medicine

## 2023-09-19 DIAGNOSIS — I4892 Unspecified atrial flutter: Secondary | ICD-10-CM

## 2023-09-19 NOTE — Telephone Encounter (Signed)
Dr. Alanda Slim request a Zio monitor for A-Fib. Patient enrolled and wife notified.

## 2023-09-20 DIAGNOSIS — I129 Hypertensive chronic kidney disease with stage 1 through stage 4 chronic kidney disease, or unspecified chronic kidney disease: Secondary | ICD-10-CM | POA: Diagnosis not present

## 2023-09-20 DIAGNOSIS — I4892 Unspecified atrial flutter: Secondary | ICD-10-CM | POA: Diagnosis not present

## 2023-09-20 DIAGNOSIS — I480 Paroxysmal atrial fibrillation: Secondary | ICD-10-CM | POA: Diagnosis not present

## 2023-09-20 DIAGNOSIS — S2241XD Multiple fractures of ribs, right side, subsequent encounter for fracture with routine healing: Secondary | ICD-10-CM | POA: Diagnosis not present

## 2023-09-20 DIAGNOSIS — E1122 Type 2 diabetes mellitus with diabetic chronic kidney disease: Secondary | ICD-10-CM | POA: Diagnosis not present

## 2023-09-23 DIAGNOSIS — E1122 Type 2 diabetes mellitus with diabetic chronic kidney disease: Secondary | ICD-10-CM | POA: Diagnosis not present

## 2023-09-23 DIAGNOSIS — I480 Paroxysmal atrial fibrillation: Secondary | ICD-10-CM | POA: Diagnosis not present

## 2023-09-23 DIAGNOSIS — I4892 Unspecified atrial flutter: Secondary | ICD-10-CM | POA: Diagnosis not present

## 2023-09-23 DIAGNOSIS — E782 Mixed hyperlipidemia: Secondary | ICD-10-CM | POA: Diagnosis not present

## 2023-09-23 DIAGNOSIS — I129 Hypertensive chronic kidney disease with stage 1 through stage 4 chronic kidney disease, or unspecified chronic kidney disease: Secondary | ICD-10-CM | POA: Diagnosis not present

## 2023-09-23 DIAGNOSIS — S2241XD Multiple fractures of ribs, right side, subsequent encounter for fracture with routine healing: Secondary | ICD-10-CM | POA: Diagnosis not present

## 2023-09-24 DIAGNOSIS — I4892 Unspecified atrial flutter: Secondary | ICD-10-CM | POA: Diagnosis not present

## 2023-09-25 DIAGNOSIS — I4892 Unspecified atrial flutter: Secondary | ICD-10-CM | POA: Diagnosis not present

## 2023-09-25 DIAGNOSIS — S2241XD Multiple fractures of ribs, right side, subsequent encounter for fracture with routine healing: Secondary | ICD-10-CM | POA: Diagnosis not present

## 2023-09-25 DIAGNOSIS — E1122 Type 2 diabetes mellitus with diabetic chronic kidney disease: Secondary | ICD-10-CM | POA: Diagnosis not present

## 2023-09-25 DIAGNOSIS — I129 Hypertensive chronic kidney disease with stage 1 through stage 4 chronic kidney disease, or unspecified chronic kidney disease: Secondary | ICD-10-CM | POA: Diagnosis not present

## 2023-09-25 DIAGNOSIS — I480 Paroxysmal atrial fibrillation: Secondary | ICD-10-CM | POA: Diagnosis not present

## 2023-09-29 DIAGNOSIS — K219 Gastro-esophageal reflux disease without esophagitis: Secondary | ICD-10-CM | POA: Diagnosis not present

## 2023-09-29 DIAGNOSIS — I4891 Unspecified atrial fibrillation: Secondary | ICD-10-CM | POA: Diagnosis not present

## 2023-09-29 DIAGNOSIS — N1831 Chronic kidney disease, stage 3a: Secondary | ICD-10-CM | POA: Diagnosis not present

## 2023-09-29 DIAGNOSIS — S2241XD Multiple fractures of ribs, right side, subsequent encounter for fracture with routine healing: Secondary | ICD-10-CM | POA: Diagnosis not present

## 2023-09-29 DIAGNOSIS — I10 Essential (primary) hypertension: Secondary | ICD-10-CM | POA: Diagnosis not present

## 2023-09-29 DIAGNOSIS — E1122 Type 2 diabetes mellitus with diabetic chronic kidney disease: Secondary | ICD-10-CM | POA: Diagnosis not present

## 2023-09-29 DIAGNOSIS — D509 Iron deficiency anemia, unspecified: Secondary | ICD-10-CM | POA: Diagnosis not present

## 2023-09-29 DIAGNOSIS — R809 Proteinuria, unspecified: Secondary | ICD-10-CM | POA: Diagnosis not present

## 2023-09-29 DIAGNOSIS — E782 Mixed hyperlipidemia: Secondary | ICD-10-CM | POA: Diagnosis not present

## 2023-09-30 DIAGNOSIS — I129 Hypertensive chronic kidney disease with stage 1 through stage 4 chronic kidney disease, or unspecified chronic kidney disease: Secondary | ICD-10-CM | POA: Diagnosis not present

## 2023-09-30 DIAGNOSIS — E1122 Type 2 diabetes mellitus with diabetic chronic kidney disease: Secondary | ICD-10-CM | POA: Diagnosis not present

## 2023-09-30 DIAGNOSIS — I4892 Unspecified atrial flutter: Secondary | ICD-10-CM | POA: Diagnosis not present

## 2023-09-30 DIAGNOSIS — I480 Paroxysmal atrial fibrillation: Secondary | ICD-10-CM | POA: Diagnosis not present

## 2023-09-30 DIAGNOSIS — S2241XD Multiple fractures of ribs, right side, subsequent encounter for fracture with routine healing: Secondary | ICD-10-CM | POA: Diagnosis not present

## 2023-10-02 DIAGNOSIS — S2241XD Multiple fractures of ribs, right side, subsequent encounter for fracture with routine healing: Secondary | ICD-10-CM | POA: Diagnosis not present

## 2023-10-02 DIAGNOSIS — I129 Hypertensive chronic kidney disease with stage 1 through stage 4 chronic kidney disease, or unspecified chronic kidney disease: Secondary | ICD-10-CM | POA: Diagnosis not present

## 2023-10-02 DIAGNOSIS — E1122 Type 2 diabetes mellitus with diabetic chronic kidney disease: Secondary | ICD-10-CM | POA: Diagnosis not present

## 2023-10-02 DIAGNOSIS — I4892 Unspecified atrial flutter: Secondary | ICD-10-CM | POA: Diagnosis not present

## 2023-10-02 DIAGNOSIS — I480 Paroxysmal atrial fibrillation: Secondary | ICD-10-CM | POA: Diagnosis not present

## 2023-10-03 DIAGNOSIS — M25531 Pain in right wrist: Secondary | ICD-10-CM | POA: Diagnosis not present

## 2023-10-03 DIAGNOSIS — M65831 Other synovitis and tenosynovitis, right forearm: Secondary | ICD-10-CM | POA: Diagnosis not present

## 2023-10-03 DIAGNOSIS — M13831 Other specified arthritis, right wrist: Secondary | ICD-10-CM | POA: Diagnosis not present

## 2023-10-03 DIAGNOSIS — M67431 Ganglion, right wrist: Secondary | ICD-10-CM | POA: Diagnosis not present

## 2023-10-06 DIAGNOSIS — I129 Hypertensive chronic kidney disease with stage 1 through stage 4 chronic kidney disease, or unspecified chronic kidney disease: Secondary | ICD-10-CM | POA: Diagnosis not present

## 2023-10-06 DIAGNOSIS — S2241XD Multiple fractures of ribs, right side, subsequent encounter for fracture with routine healing: Secondary | ICD-10-CM | POA: Diagnosis not present

## 2023-10-06 DIAGNOSIS — I480 Paroxysmal atrial fibrillation: Secondary | ICD-10-CM | POA: Diagnosis not present

## 2023-10-06 DIAGNOSIS — I4892 Unspecified atrial flutter: Secondary | ICD-10-CM | POA: Diagnosis not present

## 2023-10-06 DIAGNOSIS — E1122 Type 2 diabetes mellitus with diabetic chronic kidney disease: Secondary | ICD-10-CM | POA: Diagnosis not present

## 2023-10-14 DIAGNOSIS — I4892 Unspecified atrial flutter: Secondary | ICD-10-CM | POA: Diagnosis not present

## 2023-11-13 ENCOUNTER — Encounter: Payer: Self-pay | Admitting: Internal Medicine

## 2023-11-13 ENCOUNTER — Ambulatory Visit: Payer: Medicare Other | Attending: Internal Medicine | Admitting: Internal Medicine

## 2023-11-13 VITALS — BP 142/66 | HR 77 | Ht 68.0 in | Wt 170.0 lb

## 2023-11-13 DIAGNOSIS — I4892 Unspecified atrial flutter: Secondary | ICD-10-CM | POA: Diagnosis not present

## 2023-11-13 MED ORDER — APIXABAN 5 MG PO TABS: 5.0000 mg | ORAL_TABLET | Freq: Two times a day (BID) | ORAL | 5 refills | Status: AC

## 2023-11-13 NOTE — Patient Instructions (Signed)
 Medication Instructions:  Your physician has recommended you make the following change in your medication:  Start taking Eliquis 5 mg twice daily Continue taking all other medications as prescribed  Labwork: None  Testing/Procedures: Your physician has requested that you have an echocardiogram. Echocardiography is a painless test that uses sound waves to create images of your heart. It provides your doctor with information about the size and shape of your heart and how well your heart's chambers and valves are working. This procedure takes approximately one hour. There are no restrictions for this procedure. Please do NOT wear cologne, perfume, aftershave, or lotions (deodorant is allowed). Please arrive 15 minutes prior to your appointment time.  Please note: We ask at that you not bring children with you during ultrasound (echo/ vascular) testing. Due to room size and safety concerns, children are not allowed in the ultrasound rooms during exams. Our front office staff cannot provide observation of children in our lobby area while testing is being conducted. An adult accompanying a patient to their appointment will only be allowed in the ultrasound room at the discretion of the ultrasound technician under special circumstances. We apologize for any inconvenience.   Follow-Up: Your physician recommends that you schedule a follow-up appointment in: 1 year. You will receive a reminder call in about 8 months reminding you to schedule your appointment. If you don't receive this call, please contact our office.   Any Other Special Instructions Will Be Listed Below (If Applicable). Thank you for choosing [facility]!     If you need a refill on your cardiac medications before your next appointment, please call your pharmacy.

## 2023-11-13 NOTE — Progress Notes (Signed)
 Cardiology Office Note  Date: 11/13/2023   ID: Alejandro Krueger, Alejandro Krueger May 30, 1941, MRN [id number]  PCP:  Benita Stabile, MD  Cardiologist:  None Electrophysiologist:  None   History of Present Illness: Alejandro Krueger is a 83 y.o. male known to have paroxysmal atrial flutter (newly diagnosed in January 2025), HLD, DM 2 was referred to cardiology clinic to discuss event monitor results.  Patient was admitted to [facility] after he accidentally tripped and fell down resulting in rib fractures and chest wall trauma.  EKG showed SVT initially but later confirmed atrial flutter with RVR.  He was started on diltiazem drip with adequate control in heart rates and he spontaneously converted to normal sinus rhythm.  He was not started on systemic AC due to concern for hemothorax due to chest wall trauma.  Currently he is doing well.  No more chest soreness.  Breathing fine.  No DOE, dizziness, presyncope, syncope, palpitations, leg swelling.  He does not usually fall, according to the patient.  Patient was discharged on event monitor upon discharge from [facility] hospital.  I reviewed the event monitor which did not show any evidence of atrial arrhythmias except for 4.8% PAC burden.  Past Medical History:  Diagnosis Date   Depression    Diabetes mellitus without complication (HCC)    type 2   GERD (gastroesophageal reflux disease)    HLD (hyperlipidemia)    Hx of adenomatous colonic polyps    Hypertension    Prostate cancer (HCC)    Umbilical hernia with obstruction     Past Surgical History:  Procedure Laterality Date   COLONOSCOPY  06/23/2007   BMW:[id number] polyps removed (cecum and hepatic flexure). The remainder of the colonic mucosa appeared normal/Left-sided diverticula/Normal rectum (tubular adenomas)   COLONOSCOPY N/A 01/05/2014   Procedure: COLONOSCOPY;  Surgeon: Corbin Ade, MD;  [facility];  Service: Endoscopy;  Laterality: N/A;  9:30   ESOPHAGOGASTRODUODENOSCOPY N/A 01/05/2014    Procedure: ESOPHAGOGASTRODUODENOSCOPY (EGD);  Surgeon: Corbin Ade, MD;  Location: [facility];  Service: Endoscopy;  Laterality: N/A;   HARDWARE REMOVAL Right 11/17/2020   Procedure: RIGHT HIP HARDWARE REMOVAL;  Surgeon: Vickki Hearing, MD;  Location: [facility] ORS;  Service: Orthopedics;  Laterality: Right;   HIP PINNING,CANNULATED Right 11/09/2019   Procedure: CANNULATED HIP PINNING;  Surgeon: Vickki Hearing, MD;  Location: [facility];  Service: Orthopedics;  Laterality: Right;   KNEE ARTHROSCOPY  2003   right   LUMBAR LAMINECTOMY/DECOMPRESSION MICRODISCECTOMY Right 04/07/2023   Procedure: Microdiscectomy - right - Lumbar four-Lumbar five;  Surgeon: Donalee Citrin, MD;  Location: [facility] OR;  Service: Neurosurgery;  Laterality: Right;   prostate biopsy  2016   RADIOACTIVE SEED IMPLANT N/A 06/02/2015   Procedure: RADIOACTIVE SEED IMPLANT/BRACHYTHERAPY IMPLANT;  Surgeon: Jerilee Field, MD;  Location: [facility];  Service: Urology;  Laterality: N/A;  63 seeds implanted no seeds in bladder   UMBILICAL HERNIA REPAIR N/A 02/12/2017   Procedure: HERNIA REPAIR UMBILICAL ADULT WITH MESH;  Surgeon: Franky Macho, MD;  Location: [facility];  Service: General;  Laterality: N/A;    Current Outpatient Medications  Medication Sig Dispense Refill   buPROPion (WELLBUTRIN XL) 150 MG 24 hr tablet Take 150 mg by mouth in the morning.     carvedilol (COREG) 6.25 MG tablet Take 1 tablet (6.25 mg total) by mouth 2 (two) times daily. 60 tablet 3   metFORMIN (GLUCOPHAGE-XR) 500 MG 24 hr tablet Take 500 mg by mouth  daily.     omeprazole (PRILOSEC) 20 MG capsule Take 20 mg by mouth daily.     pravastatin (PRAVACHOL) 20 MG tablet Take 20 mg by mouth in the morning.     No current facility-administered medications for this visit.   Allergies:  Patient has no known allergies.   Social History: The patient  reports that he has never smoked. He has never used smokeless tobacco. He reports that he does not drink  alcohol and does not use drugs.   Family History: The patient's family history includes Diabetes in his mother; Heart attack in his father.   ROS:  Please see the history of present illness. Otherwise, complete review of systems is positive for none  All other systems are reviewed and negative.   Physical Exam: VS:  BP (!) 142/66   Pulse 77   Ht 5\' 8"  (1.727 m)   Wt 170 lb (77.1 kg)   SpO2 94%   BMI 25.85 kg/m , BMI Body mass index is 25.85 kg/m.  Wt Readings from Last 3 Encounters:  11/13/23 170 lb (77.1 kg)  09/17/23 167 lb 8.8 oz (76 kg)  04/07/23 166 lb 11.2 oz (75.6 kg)    General: Patient appears comfortable at rest. HEENT: Conjunctiva and lids normal, oropharynx clear with moist mucosa. Neck: Supple, no elevated JVP or carotid bruits, no thyromegaly. Lungs: Clear to auscultation, nonlabored breathing at rest. Cardiac: Regular rate and rhythm, no S3 or significant systolic murmur, no pericardial rub. Abdomen: Soft, nontender, no hepatomegaly, bowel sounds present, no guarding or rebound. Extremities: No pitting edema, distal pulses 2+. Skin: Warm and dry. Musculoskeletal: No kyphosis. Neuropsychiatric: Alert and oriented x3, affect grossly appropriate.  Recent Labwork: 09/18/2023: BUN 25; Creatinine, Ser 1.50; Hemoglobin 13.7; Magnesium 2.2; Platelets 227; Potassium 4.4; Sodium 138  No results found for: "CHOL", "TRIG", "HDL", "CHOLHDL", "VLDL", "LDLCALC", "LDLDIRECT"    Assessment and Plan:   Paroxysmal atrial flutter: New diagnosis during recent [facility] hospitalization in January 2025 after he had an accidental fall resulting in multiple rib fractures and chest wall trauma.  Initially started on Cardizem drip, converted to NSR and was not started on systemic AC due to concern for hemothorax.  His CHA2DS2-VASc score is 3, he is doing great currently, will start him on Eliquis 5 mg twice daily.  I discussed with him the risks and benefits of being on systemic AC.  He does  not have any increased risk of falls.  Continue carvedilol 6.25 mg twice daily.  He does not have any OSA symptoms.  Obtain echocardiogram.  I reviewed and discussed event monitor findings with the patient today.  Did not have any questions.  4.8% PAC burden: Asymptomatic.  Will continue carvedilol 6.25 mg twice daily.  HTN, controlled: He was previously on amlodipine and lisinopril that were discontinued during recent hospitalization after he was started on carvedilol 6.25 mg twice daily.  Agree with continuing carvedilol 6.25 mg twice daily and not restarting his previous medications due to controlled blood pressures now.  He does not check BP at home.  Does not have a blood pressure machine at home.  Instructed him to purchase one and check it.  Goal BP less than 150/90 mmHg.  HLD, unknown values: Continue atorvastatin 20 mg nightly.  Goal LDL less than 100.  AAA 3 cm in 2025: Surveillance every 3 years.      Medication Adjustments/Labs and Tests Ordered: Current medicines are reviewed at length with the patient today.  Concerns regarding  medicines are outlined above.    Disposition:  Follow up  1 year  Signed Aarushi Hemric Verne Spurr, MD, 11/13/2023 1:41 PM    [facility] [address] [place], [place], [place] [address]

## 2023-11-14 DIAGNOSIS — M65831 Other synovitis and tenosynovitis, right forearm: Secondary | ICD-10-CM | POA: Diagnosis not present

## 2023-11-14 DIAGNOSIS — S63501A Unspecified sprain of right wrist, initial encounter: Secondary | ICD-10-CM | POA: Diagnosis not present

## 2023-11-14 DIAGNOSIS — M67431 Ganglion, right wrist: Secondary | ICD-10-CM | POA: Diagnosis not present

## 2023-12-11 ENCOUNTER — Ambulatory Visit: Attending: Internal Medicine

## 2023-12-11 DIAGNOSIS — I4892 Unspecified atrial flutter: Secondary | ICD-10-CM

## 2023-12-12 LAB — ECHOCARDIOGRAM COMPLETE
AR max vel: 2.26 cm2
AV Area VTI: 2.4 cm2
AV Area mean vel: 2.22 cm2
AV Mean grad: 4 mmHg
AV Peak grad: 8.5 mmHg
Ao pk vel: 1.46 m/s
Area-P 1/2: 2.28 cm2
Calc EF: 62.1 %
MV VTI: 2.36 cm2
S' Lateral: 2.3 cm
Single Plane A2C EF: 55.3 %
Single Plane A4C EF: 67.2 %

## 2023-12-22 ENCOUNTER — Telehealth: Payer: Self-pay

## 2023-12-22 NOTE — Telephone Encounter (Signed)
-----   Message from Vishnu P Mallipeddi sent at 12/15/2023  9:41 AM EDT ----- LVEF 65 to 70% (normal), indeterminate diastology, RV function normal, no valvular heart disease.  Overall, normal echocardiogram.

## 2023-12-22 NOTE — Telephone Encounter (Signed)
 The patient has been notified of the result and verbalized understanding.  All questions (if any) were answered. Camilo Cella, [place] 12/22/2023 9:09 AM

## 2023-12-24 NOTE — Progress Notes (Shared)
 [facility] - Clinic Note  01/07/2024     CHIEF COMPLAINT Patient presents for No chief complaint on file.   HISTORY OF PRESENT ILLNESS: Alejandro Krueger is a 83 y.o. male who presents to the clinic today for:      Referring physician: Omie Bickers, MD [id number] [place],  [place] [address]  HISTORICAL INFORMATION:   Selected notes from the MEDICAL RECORD NUMBER Referred by Dr. Barbra Boone for RPE changes OD LEE:  Ocular Hx- PMH-    CURRENT MEDICATIONS: No current outpatient medications on file. (Ophthalmic Drugs)   No current facility-administered medications for this visit. (Ophthalmic Drugs)   Current Outpatient Medications (Other)  Medication Sig   apixaban  (ELIQUIS ) 5 MG TABS tablet Take 1 tablet (5 mg total) by mouth 2 (two) times daily.   buPROPion  (WELLBUTRIN  XL) 150 MG 24 hr tablet Take 150 mg by mouth in the morning.   carvedilol  (COREG ) 6.25 MG tablet Take 1 tablet (6.25 mg total) by mouth 2 (two) times daily.   metFORMIN  (GLUCOPHAGE -XR) 500 MG 24 hr tablet Take 500 mg by mouth daily.   omeprazole (PRILOSEC) 20 MG capsule Take 20 mg by mouth daily.   pravastatin  (PRAVACHOL ) 20 MG tablet Take 20 mg by mouth in the morning.   No current facility-administered medications for this visit. (Other)   REVIEW OF SYSTEMS:    ALLERGIES No Known Allergies  PAST MEDICAL HISTORY Past Medical History:  Diagnosis Date   Depression    Diabetes mellitus without complication (HCC)    type 2   GERD (gastroesophageal reflux disease)    HLD (hyperlipidemia)    Hx of adenomatous colonic polyps    Hypertension    Prostate cancer (HCC)    Umbilical hernia with obstruction    Past Surgical History:  Procedure Laterality Date   COLONOSCOPY  06/23/2007   [id number] polyps removed (cecum and hepatic flexure). The remainder of the colonic mucosa appeared normal/Left-sided diverticula/Normal rectum (tubular adenomas)   COLONOSCOPY N/A 01/05/2014    Procedure: COLONOSCOPY;  Surgeon: Suzette Espy, MD;  [facility];  Service: Endoscopy;  Laterality: N/A;  9:30   ESOPHAGOGASTRODUODENOSCOPY N/A 01/05/2014   Procedure: ESOPHAGOGASTRODUODENOSCOPY (EGD);  Surgeon: Suzette Espy, MD;  [facility]: [facility];  Service: Endoscopy;  Laterality: N/A;   HARDWARE REMOVAL Right 11/17/2020   Procedure: RIGHT HIP HARDWARE REMOVAL;  Surgeon: Darrin Emerald, MD;  Location: [facility];  Service: Orthopedics;  Laterality: Right;   HIP PINNING,CANNULATED Right 11/09/2019   Procedure: CANNULATED HIP PINNING;  Surgeon: Darrin Emerald, MD;  Location: [facility];  Service: Orthopedics;  Laterality: Right;   KNEE ARTHROSCOPY  2003   right   LUMBAR LAMINECTOMY/DECOMPRESSION MICRODISCECTOMY Right 04/07/2023   Procedure: Microdiscectomy - right - Lumbar four-Lumbar five;  Surgeon: Gearl Keens, MD;  Location: [facility];  Service: Neurosurgery;  Laterality: Right;   prostate biopsy  2016   RADIOACTIVE SEED IMPLANT N/A 06/02/2015   Procedure: RADIOACTIVE SEED IMPLANT/BRACHYTHERAPY IMPLANT;  Surgeon: Christina Coyer, MD;  Location: [facility];  Service: Urology;  Laterality: N/A;  63 seeds implanted no seeds in bladder   UMBILICAL HERNIA REPAIR N/A 02/12/2017   Procedure: HERNIA REPAIR UMBILICAL ADULT WITH MESH;  Surgeon: Alanda Allegra, MD;  Location: [facility];  Service: General;  Laterality: N/A;   FAMILY HISTORY Family History  Problem Relation Age of Onset   Diabetes Mother    Heart attack Father    Colon  cancer Neg Hx    SOCIAL HISTORY Social History   Tobacco Use   Smoking status: Never   Smokeless tobacco: Never  Vaping Use   Vaping status: Never Used  Substance Use Topics   Alcohol use: No   Drug use: No       OPHTHALMIC EXAM:  Not recorded    IMAGING AND PROCEDURES  Imaging and Procedures for 01/07/2024          ASSESSMENT/PLAN:  No diagnosis found.   Age related macular degeneration, non-exudative, OU -  intermediate stage OU - FA (1.22.24) OD: Patchy staining nasal mac, central blockage. ?CNV, OS: No CNV - OCT shows OD: Interval improvement in peripapillary IRF nasal mac, central vitelliform like lesion w/ interval improvement in surrounding ORA, mild dusen; OS: NFP; no IRF/SRF; partial PVD - BCVA remains 20/20 OU  - continue amsler grid monitoring  - will continue to hold off on treatment and monitor closely  - f/u 4 months, sooner prn -- DFE/OCT  2,3. Hypertensive retinopathy OU - discussed importance of tight BP control - monitor  4. Pseudophakia OU  - s/p CE/IOL OU (Dr. Hershall Lory: OD: 04.19.24, OS: 05.03.24)  - IOLs in good position, doing well  - monitor   Ophthalmic Meds Ordered this visit:  No orders of the defined types were placed in this encounter.    No follow-ups on file.  There are no Patient Instructions on file for this visit.   Explained the diagnoses, plan, and follow up with the patient and they expressed understanding.  Patient expressed understanding of the importance of proper follow up care.   This document serves as a record of services personally performed by Jeanice Millard, MD, PhD. It was created on their behalf by Angelia Kelp, an ophthalmic technician. The creation of this record is the provider's dictation and/or activities during the visit.    Electronically signed by: Angelia Kelp, OA, 12/24/23  10:11 AM   Jeanice Millard, M.D., Ph.D. Diseases & Surgery of the Retina and Vitreous [facility]    Abbreviations: M myopia (nearsighted); A astigmatism; H hyperopia (farsighted); P presbyopia; Mrx spectacle prescription;  CTL contact lenses; OD right eye; OS left eye; OU both eyes  XT exotropia; ET esotropia; PEK punctate epithelial keratitis; PEE punctate epithelial erosions; DES dry eye syndrome; MGD meibomian gland dysfunction; ATs artificial tears; PFAT's preservative free artificial tears; NSC nuclear sclerotic  cataract; PSC posterior subcapsular cataract; ERM epi-retinal membrane; PVD posterior vitreous detachment; RD retinal detachment; DM diabetes mellitus; DR diabetic retinopathy; NPDR non-proliferative diabetic retinopathy; PDR proliferative diabetic retinopathy; CSME clinically significant macular edema; DME diabetic macular edema; dbh dot blot hemorrhages; CWS cotton wool spot; POAG primary open angle glaucoma; C/D cup-to-disc ratio; HVF humphrey visual field; GVF goldmann visual field; OCT optical coherence tomography; IOP intraocular pressure; BRVO Branch retinal vein occlusion; CRVO central retinal vein occlusion; CRAO central retinal artery occlusion; BRAO branch retinal artery occlusion; RT retinal tear; SB scleral buckle; PPV pars plana vitrectomy; VH Vitreous hemorrhage; PRP panretinal laser photocoagulation; IVK intravitreal kenalog; VMT vitreomacular traction; MH Macular hole;  NVD neovascularization of the disc; NVE neovascularization elsewhere; AREDS age related eye disease study; ARMD age related macular degeneration; POAG primary open angle glaucoma; EBMD epithelial/anterior basement membrane dystrophy; ACIOL anterior chamber intraocular lens; IOL intraocular lens; PCIOL posterior chamber intraocular lens; Phaco/IOL phacoemulsification with intraocular lens placement; PRK photorefractive keratectomy; LASIK laser assisted in situ keratomileusis; HTN hypertension; DM diabetes mellitus; COPD chronic obstructive pulmonary  disease

## 2023-12-26 DIAGNOSIS — M87837 Other osteonecrosis of right carpus: Secondary | ICD-10-CM | POA: Diagnosis not present

## 2023-12-26 DIAGNOSIS — S63501A Unspecified sprain of right wrist, initial encounter: Secondary | ICD-10-CM | POA: Diagnosis not present

## 2023-12-26 DIAGNOSIS — M65831 Other synovitis and tenosynovitis, right forearm: Secondary | ICD-10-CM | POA: Diagnosis not present

## 2024-01-07 ENCOUNTER — Encounter (INDEPENDENT_AMBULATORY_CARE_PROVIDER_SITE_OTHER): Payer: Medicare Other | Admitting: Ophthalmology

## 2024-01-07 DIAGNOSIS — I10 Essential (primary) hypertension: Secondary | ICD-10-CM

## 2024-01-07 DIAGNOSIS — H35033 Hypertensive retinopathy, bilateral: Secondary | ICD-10-CM

## 2024-01-07 DIAGNOSIS — Z961 Presence of intraocular lens: Secondary | ICD-10-CM

## 2024-01-07 DIAGNOSIS — H353132 Nonexudative age-related macular degeneration, bilateral, intermediate dry stage: Secondary | ICD-10-CM

## 2024-01-08 DIAGNOSIS — E1122 Type 2 diabetes mellitus with diabetic chronic kidney disease: Secondary | ICD-10-CM | POA: Diagnosis not present

## 2024-01-08 DIAGNOSIS — E782 Mixed hyperlipidemia: Secondary | ICD-10-CM | POA: Diagnosis not present

## 2024-01-09 DIAGNOSIS — R809 Proteinuria, unspecified: Secondary | ICD-10-CM | POA: Diagnosis not present

## 2024-01-09 DIAGNOSIS — Z96649 Presence of unspecified artificial hip joint: Secondary | ICD-10-CM | POA: Diagnosis not present

## 2024-01-09 DIAGNOSIS — M5441 Lumbago with sciatica, right side: Secondary | ICD-10-CM | POA: Diagnosis not present

## 2024-01-09 DIAGNOSIS — D509 Iron deficiency anemia, unspecified: Secondary | ICD-10-CM | POA: Diagnosis not present

## 2024-01-09 DIAGNOSIS — N1831 Chronic kidney disease, stage 3a: Secondary | ICD-10-CM | POA: Diagnosis not present

## 2024-01-09 DIAGNOSIS — I10 Essential (primary) hypertension: Secondary | ICD-10-CM | POA: Diagnosis not present

## 2024-01-09 DIAGNOSIS — K219 Gastro-esophageal reflux disease without esophagitis: Secondary | ICD-10-CM | POA: Diagnosis not present

## 2024-01-09 DIAGNOSIS — E782 Mixed hyperlipidemia: Secondary | ICD-10-CM | POA: Diagnosis not present

## 2024-01-09 DIAGNOSIS — E1122 Type 2 diabetes mellitus with diabetic chronic kidney disease: Secondary | ICD-10-CM | POA: Diagnosis not present

## 2024-01-13 NOTE — Progress Notes (Signed)
 [facility] - Clinic Note  01/15/2024     CHIEF COMPLAINT Patient presents for Retina Follow Up   HISTORY OF PRESENT ILLNESS: Alejandro Krueger is a 83 y.o. male who presents to the clinic today for:   HPI     Retina Follow Up   Patient presents with  Dry AMD.  In both eyes.  This started 4 months ago.  I, the attending physician,  performed the HPI with the patient and updated documentation appropriately.        Comments   Patient here for 4 months retina follow up for non exu ARMD OU. Patient states vision doing ok. No eye pain.       Last edited by Ronelle Coffee, MD on 01/15/2024  6:58 PM.     Pt states vision is pretty good, he saw Dr. Adora Hoover last Thursday for a pressure check and said it was good    Referring physician: Omie Bickers, MD [address] [place],  [place] [address]  HISTORICAL INFORMATION:   Selected notes from the MEDICAL RECORD NUMBER Referred by Dr. Barbra Boone for RPE changes OD LEE:  Ocular Hx- PMH-    CURRENT MEDICATIONS: No current outpatient medications on file. (Ophthalmic Drugs)   No current facility-administered medications for this visit. (Ophthalmic Drugs)   Current Outpatient Medications (Other)  Medication Sig   apixaban  (ELIQUIS ) 5 MG TABS tablet Take 1 tablet (5 mg total) by mouth 2 (two) times daily.   buPROPion  (WELLBUTRIN  XL) 150 MG 24 hr tablet Take 150 mg by mouth in the morning.   carvedilol  (COREG ) 6.25 MG tablet Take 1 tablet (6.25 mg total) by mouth 2 (two) times daily.   metFORMIN  (GLUCOPHAGE -XR) 500 MG 24 hr tablet Take 500 mg by mouth daily.   omeprazole (PRILOSEC) 20 MG capsule Take 20 mg by mouth daily.   pravastatin  (PRAVACHOL ) 20 MG tablet Take 20 mg by mouth in the morning.   No current facility-administered medications for this visit. (Other)   REVIEW OF SYSTEMS: ROS   Positive for: Endocrine, Cardiovascular, Eyes Negative for: Constitutional, Gastrointestinal, Neurological, Skin,  Genitourinary, Musculoskeletal, HENT, Respiratory, Psychiatric, Allergic/Imm, Heme/Lymph Last edited by Sylvan Evener, COA on 01/15/2024  8:14 AM.       ALLERGIES No Known Allergies  PAST MEDICAL HISTORY Past Medical History:  Diagnosis Date   Depression    Diabetes mellitus without complication (HCC)    type 2   GERD (gastroesophageal reflux disease)    HLD (hyperlipidemia)    Hx of adenomatous colonic polyps    Hypertension    Prostate cancer (HCC)    Umbilical hernia with obstruction    Past Surgical History:  Procedure Laterality Date   COLONOSCOPY  06/23/2007   [id number] polyps removed (cecum and hepatic flexure). The remainder of the colonic mucosa appeared normal/Left-sided diverticula/Normal rectum (tubular adenomas)   COLONOSCOPY N/A 01/05/2014   Procedure: COLONOSCOPY;  Surgeon: Suzette Espy, MD;  [facility];  Service: Endoscopy;  Laterality: N/A;  9:30   ESOPHAGOGASTRODUODENOSCOPY N/A 01/05/2014   Procedure: ESOPHAGOGASTRODUODENOSCOPY (EGD);  Surgeon: Suzette Espy, MD;  [facility];  Service: Endoscopy;  Laterality: N/A;   HARDWARE REMOVAL Right 11/17/2020   Procedure: RIGHT HIP HARDWARE REMOVAL;  Surgeon: Darrin Emerald, MD;  Location: [facility];  Service: Orthopedics;  Laterality: Right;   HIP PINNING,CANNULATED Right 11/09/2019   Procedure: CANNULATED HIP PINNING;  Surgeon: Darrin Emerald, MD;  Location: [facility];  Service: Orthopedics;  Laterality: Right;   KNEE ARTHROSCOPY  2003   right   LUMBAR LAMINECTOMY/DECOMPRESSION MICRODISCECTOMY Right 04/07/2023   Procedure: Microdiscectomy - right - Lumbar four-Lumbar five;  Surgeon: Gearl Keens, MD;  Location: [facility];  Service: Neurosurgery;  Laterality: Right;   prostate biopsy  2016   RADIOACTIVE SEED IMPLANT N/A 06/02/2015   Procedure: RADIOACTIVE SEED IMPLANT/BRACHYTHERAPY IMPLANT;  Surgeon: Christina Coyer, MD;  Location: [facility];  Service: Urology;  Laterality:  N/A;  63 seeds implanted no seeds in bladder   UMBILICAL HERNIA REPAIR N/A 02/12/2017   Procedure: HERNIA REPAIR UMBILICAL ADULT WITH MESH;  Surgeon: Alanda Allegra, MD;  Location: [facility];  Service: General;  Laterality: N/A;   FAMILY HISTORY Family History  Problem Relation Age of Onset   Diabetes Mother    Heart attack Father    Colon cancer Neg Hx    SOCIAL HISTORY Social History   Tobacco Use   Smoking status: Never   Smokeless tobacco: Never  Vaping Use   Vaping status: Never Used  Substance Use Topics   Alcohol use: No   Drug use: No       OPHTHALMIC EXAM:  Base Eye Exam     Visual Acuity (Snellen - Linear)       Right Left   Dist [place] 20/20 20/25 -2   Dist ph [place]  20/20 -1         Tonometry (Tonopen, 8:12 AM)       Right Left   Pressure 16 22         Pupils       Dark Light Shape React APD   Right 2 1 Round Brisk None   Left 2 1 Round Brisk None         Visual Fields (Counting fingers)       Left Right    Full Full         Extraocular Movement       Right Left    Full, Ortho Full, Ortho         Neuro/Psych     Oriented x3: Yes   Mood/Affect: Normal         Dilation     Both eyes: 1.0% Mydriacyl , 2.5% Phenylephrine  @ 8:12 AM           Slit Lamp and Fundus Exam     Slit Lamp Exam       Right Left   Lids/Lashes Dermatochalasis - upper lid Dermatochalasis - upper lid, mild MGD   Conjunctiva/Sclera White and quiet White and quiet, temp pinguecula   Cornea Trace tear film debris, well healed cataract wound Mild tear film debris, well healed cataract wound, fine endo pigment   Anterior Chamber Deep and Clear Moderate depth, narrow temporal angle, clear   Iris Round and poorly dilated to 4mm Round and poorly dilated to 4.2mm, Transillumination defects at 0300   Lens PC IOL in good position PC IOL in good position   Anterior Vitreous Mild syneresis Posterior vitreous detachment, mild syneresis         Fundus Exam        Right Left   Disc Pink and sharp, Temporal PPA, peripapillary cystic changes nasal mac -- slightly improved mild Pallor, Sharp rim   C/D Ratio 0.5 0.6   Macula Flat, blunted foveal reflex, fine drusen, RPE mottling and clumping, mild peripapillary cystic changes nasal mac -- slightly improved, no heme Flat, blunted foveal reflex, drusen, RPE  mottling, no heme or edema.   Vessels Attenuated, Tortuous, mild copper wiring mild attenuation, mild tortuosity, mild AV crossing changes   Periphery Attached, no heme Attached, inferior paving stone degeneration, No heme           Refraction     Wearing Rx       Sphere Cylinder Axis Add   Right +1.75 +1.25 010 +2.50   Left +1.50 +1.75 171            IMAGING AND PROCEDURES  Imaging and Procedures for 01/15/2024  OCT, Retina - OU - Both Eyes        Right Eye Quality was good. Central Foveal Thickness: 319. Progression has improved. Findings include normal foveal contour, no SRF, retinal drusen , subretinal hyper-reflective material, intraretinal fluid, outer retinal atrophy, vitreomacular adhesion (Interval improvement in peripapillary IRF nasal mac, central vitelliform like lesion w/ stable improvement in surrounding ellipsoid signal / ORA, mild drusen).   Left Eye Quality was good. Central Foveal Thickness: 328. Progression has improved. Findings include normal foveal contour, no IRF, no SRF (Partial PVD, trace peripapillary cystic changes -- improved).   Notes  *Images captured and stored on drive  Diagnosis / Impression:  OD: Interval improvement in peripapillary IRF nasal mac, central vitelliform like lesion w/ stable improvement in surrounding ellipsoid signal / ORA, mild drusen OS: NFP; no IRF/SRF; partial PVD, trace peripapillary cystic changes   Clinical management:  See below  Abbreviations: NFP - Normal foveal profile. CME - cystoid macular edema. PED - pigment epithelial detachment. IRF - intraretinal fluid. SRF -  subretinal fluid. EZ - ellipsoid zone. ERM - epiretinal membrane. ORA - outer retinal atrophy. ORT - outer retinal tubulation. SRHM - subretinal hyper-reflective material. IRHM - intraretinal hyper-reflective material            ASSESSMENT/PLAN:    ICD-10-CM   1. Intermediate stage nonexudative age-related macular degeneration of both eyes  H35.3132 OCT, Retina - OU - Both Eyes    2. Diabetes mellitus type 2 without retinopathy (HCC)  E11.9     3. Long term (current) use of oral hypoglycemic drugs  Z79.84     4. Essential hypertension  I10     5. Hypertensive retinopathy of both eyes  H35.033     6. Pseudophakia, both eyes  Z96.1      Age related macular degeneration, non-exudative, OU - intermediate stage OU -- stable - FA (1.22.24) OD: Patchy staining nasal mac, central blockage. ?CNV, OS: No CNV - OCT shows OD: Interval improvement in peripapillary IRF nasal mac, central vitelliform like lesion w/ interval improvement in surrounding ORA, mild dusen; OS: NFP; no IRF/SRF; partial PVD - BCVA 20/20 OU from 20/25 OU  - continue amsler grid monitoring  - will continue to hold off on treatment and monitor closely  - f/u 6 months, sooner prn -- DFE/OCT  2,3. Diabetes mellitus, type 2 without retinopathy  - A1c: 6.8 on 08.02.24 - The incidence, risk factors for progression, natural history and treatment options for diabetic retinopathy  were discussed with patient.   - The need for close monitoring of blood glucose, blood pressure, and serum lipids, avoiding cigarette or any type of tobacco, and the need for long term follow up was also discussed with patient. - f/u in 1 year, sooner prn - will send letter to Dr. Zac Hall  4,5. Hypertensive retinopathy OU - discussed importance of tight BP control - monitor  6. Pseudophakia OU  - s/p CE/IOL  OU (Dr. Hershall Lory: OD: 04.19.24, OS: 05.03.24)  - IOLs in good position, doing well  - monitor   Ophthalmic Meds Ordered this visit:   No orders of the defined types were placed in this encounter.    Return in about 6 months (around 07/17/2024) for f/u non-exu ARMD OU, DFE, OCT.  There are no Patient Instructions on file for this visit.   Explained the diagnoses, plan, and follow up with the patient and they expressed understanding.  Patient expressed understanding of the importance of proper follow up care.   This document serves as a record of services personally performed by Jeanice Millard, MD, PhD. It was created on their behalf by Diona Franklin, COMT. The creation of this record is the provider's dictation and/or activities during the visit.  Electronically signed by: Diona Franklin, COMT 01/15/24 7:02 PM  This document serves as a record of services personally performed by Jeanice Millard, MD, PhD. It was created on their behalf by Morley Arabia. Bevin Bucks, OA an ophthalmic technician. The creation of this record is the provider's dictation and/or activities during the visit.    Electronically signed by: Morley Arabia. Bevin Bucks, OA 01/15/24 7:02 PM  Jeanice Millard, M.D., Ph.D. Diseases & Surgery of the Retina and Vitreous [facility]  I have reviewed the above documentation for accuracy and completeness, and I agree with the above. Jeanice Millard, M.D., Ph.D. 01/15/24 7:05 PM   Abbreviations: M myopia (nearsighted); A astigmatism; H hyperopia (farsighted); P presbyopia; Mrx spectacle prescription;  CTL contact lenses; OD right eye; OS left eye; OU both eyes  XT exotropia; ET esotropia; PEK punctate epithelial keratitis; PEE punctate epithelial erosions; DES dry eye syndrome; MGD meibomian gland dysfunction; ATs artificial tears; PFAT's preservative free artificial tears; NSC nuclear sclerotic cataract; PSC posterior subcapsular cataract; ERM epi-retinal membrane; PVD posterior vitreous detachment; RD retinal detachment; DM diabetes mellitus; DR diabetic retinopathy; NPDR non-proliferative diabetic retinopathy;  PDR proliferative diabetic retinopathy; CSME clinically significant macular edema; DME diabetic macular edema; dbh dot blot hemorrhages; CWS cotton wool spot; POAG primary open angle glaucoma; C/D cup-to-disc ratio; HVF humphrey visual field; GVF goldmann visual field; OCT optical coherence tomography; IOP intraocular pressure; BRVO Branch retinal vein occlusion; CRVO central retinal vein occlusion; CRAO central retinal artery occlusion; BRAO branch retinal artery occlusion; RT retinal tear; SB scleral buckle; PPV pars plana vitrectomy; VH Vitreous hemorrhage; PRP panretinal laser photocoagulation; IVK intravitreal kenalog; VMT vitreomacular traction; MH Macular hole;  NVD neovascularization of the disc; NVE neovascularization elsewhere; AREDS age related eye disease study; ARMD age related macular degeneration; POAG primary open angle glaucoma; EBMD epithelial/anterior basement membrane dystrophy; ACIOL anterior chamber intraocular lens; IOL intraocular lens; PCIOL posterior chamber intraocular lens; Phaco/IOL phacoemulsification with intraocular lens placement; PRK photorefractive keratectomy; LASIK laser assisted in situ keratomileusis; HTN hypertension; DM diabetes mellitus; COPD chronic obstructive pulmonary disease

## 2024-01-15 ENCOUNTER — Ambulatory Visit (INDEPENDENT_AMBULATORY_CARE_PROVIDER_SITE_OTHER): Admitting: Ophthalmology

## 2024-01-15 ENCOUNTER — Encounter (INDEPENDENT_AMBULATORY_CARE_PROVIDER_SITE_OTHER): Payer: Self-pay | Admitting: Ophthalmology

## 2024-01-15 DIAGNOSIS — Z7984 Long term (current) use of oral hypoglycemic drugs: Secondary | ICD-10-CM | POA: Diagnosis not present

## 2024-01-15 DIAGNOSIS — H35033 Hypertensive retinopathy, bilateral: Secondary | ICD-10-CM

## 2024-01-15 DIAGNOSIS — Z961 Presence of intraocular lens: Secondary | ICD-10-CM

## 2024-01-15 DIAGNOSIS — H353132 Nonexudative age-related macular degeneration, bilateral, intermediate dry stage: Secondary | ICD-10-CM | POA: Diagnosis not present

## 2024-01-15 DIAGNOSIS — I10 Essential (primary) hypertension: Secondary | ICD-10-CM | POA: Diagnosis not present

## 2024-01-15 DIAGNOSIS — E119 Type 2 diabetes mellitus without complications: Secondary | ICD-10-CM | POA: Diagnosis not present

## 2024-04-08 DIAGNOSIS — S63501A Unspecified sprain of right wrist, initial encounter: Secondary | ICD-10-CM | POA: Diagnosis not present

## 2024-04-08 DIAGNOSIS — M65931 Unspecified synovitis and tenosynovitis, right forearm: Secondary | ICD-10-CM | POA: Diagnosis not present

## 2024-04-08 DIAGNOSIS — M19031 Primary osteoarthritis, right wrist: Secondary | ICD-10-CM | POA: Diagnosis not present

## 2024-05-07 DIAGNOSIS — E1122 Type 2 diabetes mellitus with diabetic chronic kidney disease: Secondary | ICD-10-CM | POA: Diagnosis not present

## 2024-05-13 ENCOUNTER — Encounter: Payer: Self-pay | Admitting: Internal Medicine

## 2024-05-13 DIAGNOSIS — D72829 Elevated white blood cell count, unspecified: Secondary | ICD-10-CM | POA: Diagnosis not present

## 2024-05-13 DIAGNOSIS — E1122 Type 2 diabetes mellitus with diabetic chronic kidney disease: Secondary | ICD-10-CM | POA: Diagnosis not present

## 2024-05-13 DIAGNOSIS — Z23 Encounter for immunization: Secondary | ICD-10-CM | POA: Diagnosis not present

## 2024-05-13 DIAGNOSIS — D509 Iron deficiency anemia, unspecified: Secondary | ICD-10-CM | POA: Diagnosis not present

## 2024-05-13 DIAGNOSIS — E1165 Type 2 diabetes mellitus with hyperglycemia: Secondary | ICD-10-CM | POA: Diagnosis not present

## 2024-05-13 DIAGNOSIS — I10 Essential (primary) hypertension: Secondary | ICD-10-CM | POA: Diagnosis not present

## 2024-05-13 DIAGNOSIS — K219 Gastro-esophageal reflux disease without esophagitis: Secondary | ICD-10-CM | POA: Diagnosis not present

## 2024-05-13 DIAGNOSIS — N1831 Chronic kidney disease, stage 3a: Secondary | ICD-10-CM | POA: Diagnosis not present

## 2024-05-13 DIAGNOSIS — R809 Proteinuria, unspecified: Secondary | ICD-10-CM | POA: Diagnosis not present

## 2024-05-20 DIAGNOSIS — S63501A Unspecified sprain of right wrist, initial encounter: Secondary | ICD-10-CM | POA: Diagnosis not present

## 2024-05-20 DIAGNOSIS — M65931 Unspecified synovitis and tenosynovitis, right forearm: Secondary | ICD-10-CM | POA: Diagnosis not present

## 2024-05-20 DIAGNOSIS — M19031 Primary osteoarthritis, right wrist: Secondary | ICD-10-CM | POA: Diagnosis not present

## 2024-06-01 DIAGNOSIS — R350 Frequency of micturition: Secondary | ICD-10-CM | POA: Diagnosis not present

## 2024-06-01 DIAGNOSIS — N476 Balanoposthitis: Secondary | ICD-10-CM | POA: Diagnosis not present

## 2024-06-11 DIAGNOSIS — M19031 Primary osteoarthritis, right wrist: Secondary | ICD-10-CM | POA: Diagnosis not present

## 2024-06-11 DIAGNOSIS — S63501A Unspecified sprain of right wrist, initial encounter: Secondary | ICD-10-CM | POA: Diagnosis not present

## 2024-07-14 NOTE — Progress Notes (Signed)
 [facility] - Clinic Note  07/15/2024     CHIEF COMPLAINT Patient presents for Retina Follow Up   HISTORY OF PRESENT ILLNESS: Alejandro Krueger is a 83 y.o. male who presents to the clinic today for:   HPI     Retina Follow Up   Patient presents with  Dry AMD.  In both eyes.  This started 22 months ago.  Severity is moderate.  Duration of 5 months.  I, the attending physician,  performed the HPI with the patient and updated documentation appropriately.        Comments   Pt states vision has been good, he is doing great. Pt denies FOL/floaters/pain/no ats.  BS=not monitored A1c=unknown      Last edited by Valdemar Rogue, MD on 07/18/2024  8:11 PM.    Pt states no changes in vision  Referring physician: Shona Norleen PEDLAR, MD [address] [place],  [place] [address]  HISTORICAL INFORMATION:   Selected notes from the MEDICAL RECORD NUMBER Referred by Dr. Darroll for RPE changes OD LEE:  Ocular Hx- PMH-    CURRENT MEDICATIONS: No current outpatient medications on file. (Ophthalmic Drugs)   No current facility-administered medications for this visit. (Ophthalmic Drugs)   Current Outpatient Medications (Other)  Medication Sig   amLODipine  (NORVASC ) 5 MG tablet Take 5 mg by mouth daily.   clotrimazole-betamethasone  (LOTRISONE) cream Apply topically.   FARXIGA 10 MG TABS tablet Take 10 mg by mouth daily.   lisinopril  (ZESTRIL ) 20 MG tablet Take 20 mg by mouth daily.   tamsulosin  (FLOMAX ) 0.4 MG CAPS capsule Take by mouth.   apixaban  (ELIQUIS ) 5 MG TABS tablet Take 1 tablet (5 mg total) by mouth 2 (two) times daily.   buPROPion  (WELLBUTRIN  XL) 150 MG 24 hr tablet Take 150 mg by mouth in the morning.   carvedilol  (COREG ) 6.25 MG tablet Take 1 tablet (6.25 mg total) by mouth 2 (two) times daily.   metFORMIN  (GLUCOPHAGE -XR) 500 MG 24 hr tablet Take 500 mg by mouth daily.   omeprazole (PRILOSEC) 20 MG capsule Take 20 mg by mouth daily.   pravastatin   (PRAVACHOL ) 20 MG tablet Take 20 mg by mouth in the morning.   No current facility-administered medications for this visit. (Other)   REVIEW OF SYSTEMS: ROS   Positive for: Endocrine, Cardiovascular, Eyes Negative for: Constitutional, Gastrointestinal, Neurological, Skin, Genitourinary, Musculoskeletal, HENT, Respiratory, Psychiatric, Allergic/Imm, Heme/Lymph Last edited by Elnor Avelina RAMAN, COT on 07/15/2024  8:18 AM.        ALLERGIES No Known Allergies  PAST MEDICAL HISTORY Past Medical History:  Diagnosis Date   Depression    Diabetes mellitus without complication (HCC)    type 2   GERD (gastroesophageal reflux disease)    HLD (hyperlipidemia)    Hx of adenomatous colonic polyps    Hypertension    Prostate cancer (HCC)    Umbilical hernia with obstruction    Past Surgical History:  Procedure Laterality Date   COLONOSCOPY  06/23/2007   MFM:Floupeoz polyps removed (cecum and hepatic flexure). The remainder of the colonic mucosa appeared normal/Left-sided diverticula/Normal rectum (tubular adenomas)   COLONOSCOPY N/A 01/05/2014   Procedure: COLONOSCOPY;  Surgeon: Lamar CHRISTELLA Hollingshead, MD;  Location: [facility];  Service: Endoscopy;  Laterality: N/A;  9:30   ESOPHAGOGASTRODUODENOSCOPY N/A 01/05/2014   Procedure: ESOPHAGOGASTRODUODENOSCOPY (EGD);  Surgeon: Lamar CHRISTELLA Hollingshead, MD;  Location: [facility];  Service: Endoscopy;  Laterality: N/A;   HARDWARE REMOVAL Right 11/17/2020  Procedure: RIGHT HIP HARDWARE REMOVAL;  Surgeon: Margrette Taft BRAVO, MD;  Location: [facility];  Service: Orthopedics;  Laterality: Right;   HIP PINNING,CANNULATED Right 11/09/2019   Procedure: CANNULATED HIP PINNING;  Surgeon: Margrette Taft BRAVO, MD;  Location: [facility];  Service: Orthopedics;  Laterality: Right;   KNEE ARTHROSCOPY  2003   right   LUMBAR LAMINECTOMY/DECOMPRESSION MICRODISCECTOMY Right 04/07/2023   Procedure: Microdiscectomy - right - Lumbar four-Lumbar five;  Surgeon: Onetha Kuba, MD;  Location:  [facility];  Service: Neurosurgery;  Laterality: Right;   prostate biopsy  2016   RADIOACTIVE SEED IMPLANT N/A 06/02/2015   Procedure: RADIOACTIVE SEED IMPLANT/BRACHYTHERAPY IMPLANT;  Surgeon: Donnice Brooks, MD;  Location: [facility];  Service: Urology;  Laterality: N/A;  63 seeds implanted no seeds in bladder   UMBILICAL HERNIA REPAIR N/A 02/12/2017   Procedure: HERNIA REPAIR UMBILICAL ADULT WITH MESH;  Surgeon: Mavis Anes, MD;  Location: [facility];  Service: General;  Laterality: N/A;   FAMILY HISTORY Family History  Problem Relation Age of Onset   Diabetes Mother    Heart attack Father    Colon cancer Neg Hx    SOCIAL HISTORY Social History   Tobacco Use   Smoking status: Never   Smokeless tobacco: Never  Vaping Use   Vaping status: Never Used  Substance Use Topics   Alcohol use: No   Drug use: No       OPHTHALMIC EXAM:  Base Eye Exam     Visual Acuity (Snellen - Linear)       Right Left   Dist [place] 20/30 -2 20/20 -1   Dist ph [place] 20/25 -1          Tonometry (Tonopen, 8:10 AM)       Right Left   Pressure 20 20         Pupils       Pupils Dark Light Shape React APD   Right PERRL 3 2 Round Brisk None   Left PERRL 3 2 Round Brisk None         Visual Fields       Left Right    Full Full         Extraocular Movement       Right Left    Full, Ortho Full, Ortho         Neuro/Psych     Oriented x3: Yes   Mood/Affect: Normal         Dilation     Both eyes: 1.0% Mydriacyl , 2.5% Phenylephrine  @ 8:12 AM           Slit Lamp and Fundus Exam     Slit Lamp Exam       Right Left   Lids/Lashes Dermatochalasis - upper lid Dermatochalasis - upper lid, mild MGD   Conjunctiva/Sclera White and quiet White and quiet, temp pinguecula   Cornea Trace tear film debris, well healed cataract wound Mild tear film debris, well healed cataract wound, fine endo pigment   Anterior Chamber Deep and Clear Moderate depth, narrow temporal  angle, clear   Iris Round and moderately dilated Round and poorly dilated to 4.47mm, Transillumination defects at 0300   Lens PC IOL in good position PC IOL in good position   Anterior Vitreous Mild syneresis Posterior vitreous detachment, mild syneresis         Fundus Exam       Right Left   Disc Pink and sharp, Temporal PPA, peripapillary cystic changes nasal  mac -- slightly improved mild Pallor, Sharp rim   C/D Ratio 0.5 0.6   Macula Flat, blunted foveal reflex, fine drusen, RPE mottling and clumping, mild peripapillary cystic changes nasal mac -- slightly improved, no heme Flat, blunted foveal reflex, drusen, RPE mottling, no heme or edema, trace ERM   Vessels Attenuated, Tortuous, mild copper wiring mild attenuation, mild tortuosity, mild AV crossing changes   Periphery Attached, no heme Attached, inferior paving stone degeneration, No heme           IMAGING AND PROCEDURES  Imaging and Procedures for 07/15/2024  OCT, Retina - OU - Both Eyes       Right Eye Quality was good. Central Foveal Thickness: 307. Progression has improved. Findings include normal foveal contour, no SRF, retinal drusen , subretinal hyper-reflective material, intraretinal fluid, outer retinal atrophy, vitreomacular adhesion (Interval improvement in peripapillary IRF nasal mac, central vitelliform like lesion w/ interval improvement in surrounding ellipsoid signal / ORA, mild drusen).   Left Eye Quality was good. Central Foveal Thickness: 325. Progression has improved. Findings include normal foveal contour, no IRF, no SRF (Partial PVD, trace peripapillary cystic changes --stably improved).   Notes *Images captured and stored on drive  Diagnosis / Impression:  OD: Interval improvement in peripapillary IRF nasal mac, central vitelliform like lesion w/ interval improvement in surrounding ellipsoid signal / ORA, mild drusen OS: NFP; no IRF/SRF; partial PVD, trace peripapillary cystic changes--stably  improved   Clinical management:  See below  Abbreviations: NFP - Normal foveal profile. CME - cystoid macular edema. PED - pigment epithelial detachment. IRF - intraretinal fluid. SRF - subretinal fluid. EZ - ellipsoid zone. ERM - epiretinal membrane. ORA - outer retinal atrophy. ORT - outer retinal tubulation. SRHM - subretinal hyper-reflective material. IRHM - intraretinal hyper-reflective material             ASSESSMENT/PLAN:    ICD-10-CM   1. Intermediate stage nonexudative age-related macular degeneration of both eyes  H35.3132 OCT, Retina - OU - Both Eyes    2. Diabetes mellitus type 2 without retinopathy (HCC)  E11.9     3. Long term (current) use of oral hypoglycemic drugs  Z79.84     4. Essential hypertension  I10     5. Hypertensive retinopathy of both eyes  H35.033     6. Pseudophakia, both eyes  Z96.1       Age related macular degeneration, non-exudative, OU - intermediate stage OU -- stable - FA (1.22.24) OD: Patchy staining nasal mac, central blockage. ?CNV, OS: No CNV - OCT shows OD: Interval improvement in peripapillary IRF nasal mac, central vitelliform like lesion w/ interval improvement in surrounding ORA, mild dusen; OS: NFP; no IRF/SRF; partial PVD, trace peripapillary cystic changes--stably improved - BCVA OD 20/25 from 20/20; OS 20/20 - stable  - continue amsler grid monitoring  - will continue to hold off on treatment and monitor closely  - f/u 6 months, sooner prn -- DFE/OCT  2,3. Diabetes mellitus, type 2 without retinopathy  - A1c: 7.1 on 09.12.25, 6.8 on 08.02.24 - The incidence, risk factors for progression, natural history and treatment options for diabetic retinopathy  were discussed with patient.   - The need for close monitoring of blood glucose, blood pressure, and serum lipids, avoiding cigarette or any type of tobacco, and the need for long term follow up was also discussed with patient. - f/u in 1 year, sooner prn - will send letter to  Dr. Shona  4,5. Hypertensive retinopathy OU -  discussed importance of tight BP control - monitor  6. Pseudophakia OU  - s/p CE/IOL OU (Dr. Lorri: OD: 04.19.24, OS: 05.03.24)  - IOLs in good position, doing well  - monitor   Ophthalmic Meds Ordered this visit:  No orders of the defined types were placed in this encounter.    Return in about 6 months (around 01/12/2025) for non exu ARMD OU, DFE, OCT.  There are no Patient Instructions on file for this visit.   Explained the diagnoses, plan, and follow up with the patient and they expressed understanding.  Patient expressed understanding of the importance of proper follow up care.   This document serves as a record of services personally performed by Redell JUDITHANN Hans, MD, PhD. It was created on their behalf by Auston Muzzy, COMT. The creation of this record is the provider's dictation and/or activities during the visit.  Electronically signed by: Auston Muzzy, COMT 07/18/24 8:11 PM  This document serves as a record of services personally performed by Redell JUDITHANN Hans, MD, PhD. It was created on their behalf by Almetta Pesa, an ophthalmic technician. The creation of this record is the provider's dictation and/or activities during the visit.    Electronically signed by: Almetta Pesa, OA, 07/18/24  8:11 PM  Redell JUDITHANN Hans, M.D., Ph.D. Diseases & Surgery of the Retina and Vitreous [facility]  I have reviewed the above documentation for accuracy and completeness, and I agree with the above. Redell JUDITHANN Hans, M.D., Ph.D. 07/18/24 8:12 PM   Abbreviations: M myopia (nearsighted); A astigmatism; H hyperopia (farsighted); P presbyopia; Mrx spectacle prescription;  CTL contact lenses; OD right eye; OS left eye; OU both eyes  XT exotropia; ET esotropia; PEK punctate epithelial keratitis; PEE punctate epithelial erosions; DES dry eye syndrome; MGD meibomian gland dysfunction; ATs artificial tears; PFAT's  preservative free artificial tears; NSC nuclear sclerotic cataract; PSC posterior subcapsular cataract; ERM epi-retinal membrane; PVD posterior vitreous detachment; RD retinal detachment; DM diabetes mellitus; DR diabetic retinopathy; NPDR non-proliferative diabetic retinopathy; PDR proliferative diabetic retinopathy; CSME clinically significant macular edema; DME diabetic macular edema; dbh dot blot hemorrhages; CWS cotton wool spot; POAG primary open angle glaucoma; C/D cup-to-disc ratio; HVF humphrey visual field; GVF goldmann visual field; OCT optical coherence tomography; IOP intraocular pressure; BRVO Branch retinal vein occlusion; CRVO central retinal vein occlusion; CRAO central retinal artery occlusion; BRAO branch retinal artery occlusion; RT retinal tear; SB scleral buckle; PPV pars plana vitrectomy; VH Vitreous hemorrhage; PRP panretinal laser photocoagulation; IVK intravitreal kenalog; VMT vitreomacular traction; MH Macular hole;  NVD neovascularization of the disc; NVE neovascularization elsewhere; AREDS age related eye disease study; ARMD age related macular degeneration; POAG primary open angle glaucoma; EBMD epithelial/anterior basement membrane dystrophy; ACIOL anterior chamber intraocular lens; IOL intraocular lens; PCIOL posterior chamber intraocular lens; Phaco/IOL phacoemulsification with intraocular lens placement; PRK photorefractive keratectomy; LASIK laser assisted in situ keratomileusis; HTN hypertension; DM diabetes mellitus; COPD chronic obstructive pulmonary disease

## 2024-07-15 ENCOUNTER — Ambulatory Visit (INDEPENDENT_AMBULATORY_CARE_PROVIDER_SITE_OTHER): Admitting: Ophthalmology

## 2024-07-15 ENCOUNTER — Encounter (INDEPENDENT_AMBULATORY_CARE_PROVIDER_SITE_OTHER): Payer: Self-pay | Admitting: Ophthalmology

## 2024-07-15 DIAGNOSIS — H353132 Nonexudative age-related macular degeneration, bilateral, intermediate dry stage: Secondary | ICD-10-CM | POA: Diagnosis not present

## 2024-07-15 DIAGNOSIS — Z961 Presence of intraocular lens: Secondary | ICD-10-CM

## 2024-07-15 DIAGNOSIS — E119 Type 2 diabetes mellitus without complications: Secondary | ICD-10-CM | POA: Diagnosis not present

## 2024-07-15 DIAGNOSIS — Z7984 Long term (current) use of oral hypoglycemic drugs: Secondary | ICD-10-CM

## 2024-07-15 DIAGNOSIS — I10 Essential (primary) hypertension: Secondary | ICD-10-CM

## 2024-07-15 DIAGNOSIS — H35033 Hypertensive retinopathy, bilateral: Secondary | ICD-10-CM

## 2024-07-18 ENCOUNTER — Encounter (INDEPENDENT_AMBULATORY_CARE_PROVIDER_SITE_OTHER): Payer: Self-pay | Admitting: Ophthalmology

## 2025-01-12 ENCOUNTER — Encounter (INDEPENDENT_AMBULATORY_CARE_PROVIDER_SITE_OTHER): Admitting: Ophthalmology
